# Patient Record
Sex: Female | Born: 1940 | Race: White | Hispanic: No | Smoking: Never smoker
Health system: Southern US, Community
[De-identification: ages and names within clinical notes are randomized; demographics above are authoritative.]

## PROBLEM LIST (undated history)

## (undated) ENCOUNTER — Ambulatory Visit (HOSPITAL_BASED_OUTPATIENT_CLINIC_OR_DEPARTMENT_OTHER): Admission: EM

## (undated) DIAGNOSIS — H35039 Hypertensive retinopathy, unspecified eye: Secondary | ICD-10-CM

## (undated) DIAGNOSIS — M810 Age-related osteoporosis without current pathological fracture: Secondary | ICD-10-CM

## (undated) DIAGNOSIS — I201 Angina pectoris with documented spasm: Secondary | ICD-10-CM

## (undated) DIAGNOSIS — I209 Angina pectoris, unspecified: Secondary | ICD-10-CM

## (undated) DIAGNOSIS — I252 Old myocardial infarction: Secondary | ICD-10-CM

## (undated) DIAGNOSIS — I10 Essential (primary) hypertension: Secondary | ICD-10-CM

## (undated) DIAGNOSIS — H3320 Serous retinal detachment, unspecified eye: Secondary | ICD-10-CM

## (undated) DIAGNOSIS — G43909 Migraine, unspecified, not intractable, without status migrainosus: Secondary | ICD-10-CM

## (undated) DIAGNOSIS — I471 Supraventricular tachycardia: Secondary | ICD-10-CM

## (undated) DIAGNOSIS — H269 Unspecified cataract: Secondary | ICD-10-CM

## (undated) DIAGNOSIS — E785 Hyperlipidemia, unspecified: Secondary | ICD-10-CM

## (undated) DIAGNOSIS — A0472 Enterocolitis due to Clostridium difficile, not specified as recurrent: Secondary | ICD-10-CM

## (undated) HISTORY — PX: CARDIAC CATHETERIZATION: SHX172

## (undated) HISTORY — DX: Supraventricular tachycardia: I47.1

## (undated) HISTORY — PX: BUNIONECTOMY: SHX129

## (undated) HISTORY — PX: CHOLECYSTECTOMY: SHX55

## (undated) HISTORY — DX: Essential (primary) hypertension: I10

## (undated) HISTORY — DX: Hypertensive retinopathy, unspecified eye: H35.039

## (undated) HISTORY — DX: Age-related osteoporosis without current pathological fracture: M81.0

## (undated) HISTORY — DX: Unspecified cataract: H26.9

## (undated) HISTORY — DX: Enterocolitis due to Clostridium difficile, not specified as recurrent: A04.72

## (undated) HISTORY — DX: Migraine, unspecified, not intractable, without status migrainosus: G43.909

## (undated) HISTORY — DX: Serous retinal detachment, unspecified eye: H33.20

## (undated) HISTORY — PX: HAMMER TOE SURGERY: SHX385

## (undated) HISTORY — DX: Angina pectoris, unspecified: I20.9

## (undated) HISTORY — DX: Old myocardial infarction: I25.2

## (undated) HISTORY — DX: Angina pectoris with documented spasm: I20.1

## (undated) HISTORY — PX: ABDOMINAL HYSTERECTOMY: SHX81

## (undated) HISTORY — DX: Hyperlipidemia, unspecified: E78.5

---

## 2003-01-04 ENCOUNTER — Encounter: Admission: RE | Admit: 2003-01-04 | Discharge: 2003-01-04 | Payer: Self-pay | Admitting: Unknown Physician Specialty

## 2003-01-04 ENCOUNTER — Encounter: Payer: Self-pay | Admitting: Unknown Physician Specialty

## 2007-05-23 ENCOUNTER — Inpatient Hospital Stay (HOSPITAL_COMMUNITY): Admission: AD | Admit: 2007-05-23 | Discharge: 2007-05-25 | Payer: Self-pay | Admitting: Cardiology

## 2007-05-23 ENCOUNTER — Ambulatory Visit: Payer: Self-pay | Admitting: Cardiology

## 2007-06-08 ENCOUNTER — Ambulatory Visit: Payer: Self-pay | Admitting: Cardiovascular Disease

## 2007-10-26 ENCOUNTER — Ambulatory Visit (HOSPITAL_BASED_OUTPATIENT_CLINIC_OR_DEPARTMENT_OTHER): Admission: RE | Admit: 2007-10-26 | Discharge: 2007-10-26 | Payer: Self-pay | Admitting: Orthopedic Surgery

## 2009-06-05 ENCOUNTER — Encounter: Admission: RE | Admit: 2009-06-05 | Discharge: 2009-06-05 | Payer: Self-pay | Admitting: Obstetrics and Gynecology

## 2010-06-10 ENCOUNTER — Encounter: Admission: RE | Admit: 2010-06-10 | Discharge: 2010-06-10 | Payer: Self-pay | Admitting: Obstetrics and Gynecology

## 2011-02-03 NOTE — Assessment & Plan Note (Signed)
[facility]                            CARDIOLOGY OFFICE NOTE   ERMALEE, MEALY                        MRN:          [id number]  DATE:06/08/2007                            DOB:          [birth year]    Kahmari Herard presents for hospital followup at the [facility] Cardiology  Office on June 08, 2007.  Ms. Rawlinson is a delightful 70 year old  woman who suffered a on-ST elevation MI earlier this month.  She  ultimately underwent cardiac catheterization and was found to have  normal coronary arteries.  There was no plaque identified.  Despite  normal coronaries, there was a focal wall motion abnormality of the  anterolateral wall of the left ventricle.  The overall left ventricular  ejection fraction was preserved and estimated at 55%.  Ms. Kreuser event  was precipitated by a stressful situation when she ran over her 16-year-  old cat with her car.  She developed chest pain immediately following  the episode and had a recurrence the following day.  At the time of the  recurrence she was evaluated at [facility] and ruled in for  myocardial infarction.   Since the event, Ms. Razzano has done very well.  She has had no further  chest pain.  She has had no dyspnea or other complaints.  She denies  palpitations, lightheadedness, syncope, orthopnea, or PND.   CURRENT MEDICATIONS:  1. Metoprolol ER 50 mg daily.  2. Zocor 40 mg at bedtime.  3. Aspirin 81 mg daily.  4. Atacand 16 mg daily.  5. Premarin 0.625 mg every other day.  6. Fosamax once a week.  7. Fish oil daily.  8. Calcium.  9. Stool softener.   ALLERGIES:  1. CODEINE.  2. SULFA.  3. ACE INHIBITORS.   PERTINENT PAST MEDICAL PROBLEMS:  1. Hypertension.  2. Osteoporosis.  3. History of migraine headaches.  4. Hyperlipidemia.  5. Prolonged hormone replacement therapy.   PHYSICAL EXAMINATION:  GENERAL:  Ms. Delisi is alert and oriented.  She  is in no acute distress.  VITAL SIGNS:   Her weight is 134 pounds, blood pressure 106/64, heart  rate 57, respiratory rate 16.  HEENT:  Normal.  NECK:  Normal carotid upstrokes without bruits.  Jugular venous pressure  normal.  LUNGS:  Clear to auscultation bilaterally.  HEART:  The apex was discrete and nondisplaced.  Heart is regular rate  and rhythm without murmurs or gallops.  ABDOMEN:  Soft, nontender.  No organomegaly.  No abdominal bruits.  EXTREMITIES:  No clubbing, cyanosis, or edema.  Peripheral pulses are 2  plus and equal throughout.   EKG shows a sinus bradycardia and is otherwise within normal limits.   ASSESSMENT:  Ms. Scioli is currently stable from a cardiovascular  standpoint.   Her cardiac problems are as follows, recent non-ST elevation myocardial  infarction.  Ms. Browder has had no recurrent symptoms and had normal  coronaries at catheterization.  She did have a clear cut wall motion  abnormality and her event may have been precipitated by coronary  vasospasm.  Recommend continuation of  current therapy including aspirin,  Simvastatin, metoprolol, and Atacand.  She is attempting to wean off of  Premarin.  She has been on Premarin since her late 68s and is having  some trouble with hot flashes.  We had a long discussion regarding the  small but real risk of increased cardiac events with hormone replacement  therapy after an initial event.  I advised that if she is highly  symptomatic off Premarin then it certainly would be acceptable for  resume daily use.  She would like to follow up in [place] and is  planning on following up with Dr. Bing Matter.  I have recommended a  followup echocardiogram in 2-3 months but certainly will leave that to  his discretion.   Ms. Brys clearly has a component of situational anxiety and high stress  on rare occasion.  I wrote her for a small number of Valium for as  needed use.  She was written for Valium 5 mg, #12, with no refills.   As stated above, Ms. Bastin will  follow up in [place].  I would be happy  to see her back at any time if I can be of further assistance.     Veverly Fells. Excell Seltzer, MD  Electronically Signed    MDC/MedQ  DD: 06/12/2007  DT: 06/12/2007  Job #: [id number]   cc:   Gypsy Balsam  Dr. Veatrice Kells in [place]

## 2011-02-03 NOTE — H&P (Signed)
Melissa Lozano, Melissa Lozano                 ACCOUNT NO.:  [id number]   MEDICAL RECORD NO.:  [id number]          PATIENT TYPE:  INP   LOCATION:  2025                         FACILITY:  [facility]   PHYSICIAN:  Gerrit Friends. Dietrich Pates, MD, FACCDATE OF BIRTH:  07-30-1941   DATE OF ADMISSION:  05/23/2007  DATE OF DISCHARGE:                              HISTORY & PHYSICAL   Melissa Lozano is a very pleasant 70 year old Caucasian female transferred  here from [facility] in the setting of a non-ST elevated MI.  Her  primary care physician is Dr. Foye Deer.  Attending physician at  [facility] was Dr. Gypsy Balsam.     Melissa Lozano ordeal began on Thursday morning.  She states she was  leaving her home and accidentally ran over her 73 year old cat.  She  became very upset and hysterical.  Her husband states it took him about  30 minutes to help her calm down.  She was very attached to her pet.  Later that evening she began having chest pain around 8:00 p.m. She  described it as a heavy pressure in the dead center of her chest,  radiating up into her neck.  She felt like her blood pressure was  elevated.  She checked it with her machine at home and it was elevated  200+/105.  She initially had her husband drive her to urgent care.  It  was closed.  They tried another urgent care that was also closed.  They  ultimately ended up at [facility] Emergency Room.  She states  once there it was very busy.  She apparently waited 40 minutes to be  triaged in.  She states she told the nurse she was having chest pain but  it had eased up some so apparently she did not receive a EKG or any work  up then.  She waited approximately 2 hours, still had not been seen by  the emergency room  physician.  She states she started to feel better so  she told the nurse that she was going to go home.  She states the nurse  there told her that more than likely it was just a panic attack, per  patient's report.   She went home, went to bed, states she felt better.  Friday she got up, did her normal routine.  She works out at [employer]  four days a week.  That afternoon around 3 o'clock the chest discomfort  returned.  This time it was more intense.  She rates it as a  6 on a  scale of 1-10.  She states it was difficult to take a deep breath.  She  felt like her blood pressure was elevated again.  She drove herself to  Dr. Hoy Finlay office.  He was not in but one of his associates was  there.  The chest discomfort increased to an 8.  She became very  diaphoretic with labored respirations.  She was given aspirin and  Nitroglycerin and EMS was called.  The patient was transported to  [facility].  EKG did not show  any acute ST or T-wave changes.  The patient was given aspirin and nitroglycerin with complete resolution  of discomfort.  EKG did show some flattening of T-waves in lead I and  inverted T-waves in AVL, otherwise normal.  However, cardiac markers  came back elevated with a troponin 3.46.  The patient was started on  Plavix, nitroglycerin paste, Atacand, Coreg, Zocor and Lovenox.  The  patient was stabilized and monitored over the weekend with plans to  transfer to [facility] for cardiac catheterization.  Melissa Lozano has just  arrived from [facility].  She currently is pain free.   ALLERGIES:  SULFA AND CODEINE.  There is a questionable history of  intolerance to Prevacid although the patient can not recall what this  causes.  Apparently it is not an ACE inhibitor secondary to a dry cough.   MEDICATIONS AT HOME:  1. Fosamax.  2. Atacand 60 mg.  3. Aspirin.  4. Premarin.   At [facility] she has received aspirin, Plavix 75, Lovenox, Zocor  80 and nitroglycerin 1 inch paste and (nitroglycerin).  This was  continued secondary to severe headache, Coreg 6.25 b.i.d. and Atacand 16  mg daily.   PAST MEDICAL HISTORY:  1. Hypertension.  2. Osteoporosis.  3. Mild  hyperlipidemia.  4. History of migraines.   PAST SURGICAL HISTORY:  1. Appendectomy.  2. Hysterectomy.  3. Tonsillectomy.  4. Cosmetic surgery.  5. Colon polyps.   Melissa Lozano had a 2-D echocardiogram done at [facility] also that  showed a well-preserved ejection fraction, questionable hypokinesis  involving the apical portion of the lateral wall with mild TR.   SOCIAL HISTORY:  She lives in [place] with her husband.  She is in the  antique business.  They have no children.  Apparently her husband is a  patient of Dr. Rosalyn Charters.  She denies any tobacco, ETOH, drug or herbal  medication use.  She follows a low-fat diet.  She works out at [employer]  during cardiovascular and weights 4 days a week.   FAMILY HISTORY:  Mother deceased in his 52s secondary to breast cancer.  Father deceased at age 76 secondary to abdominal aortic aneurysm.  He  had a known history of coronary artery disease, status post MI.  One  brother and one sister with thyroid problems.  Brother with hypertension   REVIEW OF SYSTEMS:  Positive for sweats, occasional headaches, chest  pain, shortness of breath.  All other systems negative per patient.   PHYSICAL EXAMINATION:  VITAL SIGNS:  Temperature 97.2, pulse 65,  respirations 20, blood pressure currently 127/79, sat 100% on room air.  GENERAL:  She is in no acute distress.  HEAD, EYES, EARS, NOSE AND THROAT:  Normocephalic, atraumatic.  Pupils  equal, round, react to light.  Sclerae is clear.  Mucous membranes  moist.  The patient has her own teeth.  NECK:  Supple without lymphadenopathy.  No bruits.  No JVD.  CARDIOVASCULAR:  Exam reveals S1-S2 regular rate and rhythm.  She HSA a  positive S4.  LUNGS:  Clear to auscultation bilaterally.  SKIN:  Warm and dry.  ABDOMEN:  Soft, nontender.  Positive bowel sounds.  LOWER EXTREMITIES:  Without clubbing, cyanosis or edema.  She has 2+ DPs  bilaterally.  NEUROLOGICALLY:  Alert and oriented x3.  Cranial  nerves II-XII grossly  intact.   CHEST X-RAY:  Showing no acute findings noted.  Chest x-ray done at  [facility].  EKGs at [facility]  showing normal sinus  rhythm at a rate in the 60s without acute ST changes.  EKG here is  pending.   LABORATORY DATA:  Note, all of the lab work results in this dictation  are from [facility].  Lab work here is pending.  H&H 12 and 35.5,  WBCs 5.2, platelets 239,000.  Sodium 138, potassium 3.9, chloride 105,  CO2 28, BUN 13, creatinine 0.6, glucose 77, AST 36, ALT 25, D-dimer  negative.  PT 10.1, INR 1, total cholesterol 170, triglycerides 81, LDL  88.7, HDL 65.1, TSH 3.91.  T4 5.8, T3 31.  Cardiac enzymes, troponin  3.46, 3.73 and 1.85.   IMPRESSION:  Ms. Christenbury is currently pain free.  Blood pressure stable.  She is aware that she has suffered a non-ST elevated MI.   PLAN:  The plan at this time is to proceed with cardiac catheterization  in the a.m.  The risks and benefits have been discussed with the  patient.  She agrees to proceed with catheterization.  I have also  talked with her concerning medication therapy and the reasons as to why  she is on medications as stated above.  She understands the importance  of medication therapy, exercise, diet and decreased stress and will have  cardiac rehab.  I also talked with the patient.      Dorian Pod, ACNP      Gerrit Friends. Dietrich Pates, MD, [facility]  Electronically Signed    MB/MEDQ  D:  05/23/2007  T:  05/23/2007  Job:  [id number]   cc:   Barney Drain, M.D.

## 2011-02-03 NOTE — Cardiovascular Report (Signed)
Melissa Lozano, BLUMBERG                 ACCOUNT NO.:  [id number]   MEDICAL RECORD NO.:  [id number]          PATIENT TYPE:  INP   LOCATION:  2025                         FACILITY:  [facility]   PHYSICIAN:  Veverly Fells. Excell Seltzer, MD  DATE OF BIRTH:  07-Jul-1941   DATE OF PROCEDURE:  05/24/2007  DATE OF DISCHARGE:                            CARDIAC CATHETERIZATION   PROCEDURE:  Left heart catheterization, selective coronary angiography,  left ventricular angiography and StarClose of the right femoral artery.   INDICATIONS:  Ms. Muro is a 70 year old woman who underwent an  extremely stressful situation last week when she ran over and killed her  cat of 16 years.  She was very upset and experienced substernal chest  pain at that time.  Her pain resolved after several hours, but the  following day she had a second episode of chest pain and she was  admitted to [facility].  She ruled in for myocardial infarction  with elevated cardiac enzymes and was transferred to [facility].  She  was referred for cardiac catheterization.  She has had no further  recurrent chest pain since here.   Risks and indications of the procedure were explained to the patient.  Informed consent was obtained.  The right groin was prepped, draped,  anesthetized with 1% lidocaine using modified Seldinger technique.  A 6-  French sheath was placed in the right femoral artery.  Multiple views of  the left and right coronary arteries were taken using standard preformed  Judkins catheters.  Following selective coronary angiography, an angled  pigtail catheter was inserted into the left ventricle where pressures  were recorded.  A left ventriculogram was performed.  A pullback across  the aortic valve was done.  At the completion of the procedure, a  StarClose device was deployed to seal the femoral arteriotomy..  7.   FINDINGS:  Aortic pressure 121/62 with a mean of 89, left ventricular  pressure 122/11.   The left mainstem  is angiographically normal and bifurcates into the LAD  and left circumflex.   The LAD is a large-caliber vessel that courses down and wraps around the  LV apex.  There is a large first diagonal branch that gives off multiple  branches.  There is no significant angiographic disease throughout the  LAD or its diagonal branches.   The left circumflex is large-caliber vessel that courses down and  supplies a large marginal branch.  There is also an intermediate branch  present that is of relatively small caliber.  There is no significant  angiographic disease in the left circumflex system.  The AV groove  circumflex beyond the first OM branch is fairly small.   The right coronary artery is angiographically normal.  It is a dominant  vessel.  It courses down and supplies a PDA, as well as well as a large  posterolateral branch.   Left ventricular function assessed by ventriculography is normal with  the exception of a focal anterolateral area of akinesis.  The overall  LVEF is normal, estimated at 55%.   ASSESSMENT:  1. Normal coronary arteries.  2. Focal left ventricular wall motion abnormality with preserved      overall left ventricular function.   DISCUSSION:  Ms. Manwarren may have had a transient coronary vasospasm.  It  is possible that her LV wall motion abnormality represents a stress  induced cardiomyopathy, but it certainly is not typical of Takotsubo.  In any event, I would recommend treatment with aspirin and a statin.  With her LV wall motion abnormality, she may benefit from an Ace  inhibitor,      Veverly Fells. Excell Seltzer, MD  Electronically Signed     MDC/MEDQ  D:  05/24/2007  T:  05/25/2007  Job:  [id number]

## 2011-02-03 NOTE — Discharge Summary (Signed)
Melissa Lozano, Melissa Lozano                 ACCOUNT NO.:  [id number]   MEDICAL RECORD NO.:  [id number]          PATIENT TYPE:  INP   LOCATION:  2025                         FACILITY:  [facility]   PHYSICIAN:  Veverly Fells. Excell Seltzer, MD  DATE OF BIRTH:  Mar 20, 1941   DATE OF ADMISSION:  05/23/2007  DATE OF DISCHARGE:  05/25/2007                         DISCHARGE SUMMARY - REFERRING   SUMMARY OF HISTORY:  The patient is a 70 year old white female who was  transferred from [facility] with a non-ST-elevated myocardial  infarction.  Her symptoms initially began last Tuesday morning when she  accidentally ran over her 80 year old cat.  She became hysterical and  very upset and her husband stated that it took her 30 minutes to calm  down.  Later that evening, she began having chest discomfort which she  described as a pressure radiating into her neck.  Her blood pressure was  elevated at home 200+/105; they initially drove to two separate Urgent  Cares, only to find them closed and ended up at [facility] Emergency Room.  She waited 40 minutes to be triaged and then approximately 2 hours  before she saw an ER physician.  She felt better and went home.  On  Friday, after performing her normal routine at the gym, her discomfort  returned around 3 o'clock very intense and difficult to take a deep  breath.  She drove herself to her primary care's  physician's office.  However, one of his associates saw her.  She was noted to be diaphoretic  with labored respirations.  EMS was called and she was transported to  the hospital.  She did not have any acute EKG changes and after  receiving aspirin, nitroglycerin, her discomfort was resolved.  EKG did  show some flattening inferiorly.  She ruled in for myocardial infarction  and was transferred to [facility] for cardiac catheterization.   PAST MEDICAL HISTORY:  Past medical history is notable for:  1. Hypertension.  2. Osteoporosis.  3. Mild hyperlipidemia.  4.  A history of migraines.  5. Appendectomy.  6. Hysterectomy.  7. Tonsillectomy.  8. Recent cosmetic surgery.  9. Colon polyps.   An echocardiogram at [facility] showed probable hypokinesis of the apical  portion of the lateral wall with mild TR with a preserved ejection  fraction.   LABORATORY DATA:  At [facility] on September 2, H&H is 12.5 and 35.5,  normal indices, platelets are 270, WBCs 5.1.  On discharge on the 3rd,  H&H was 12.5 and 35.8, normal indices, platelets 270, WBCs 5.3.  PTT 38,  PT 12.8, sodium 138, potassium 3.8, BUN 14, creatinine 0.71, normal LFTs  except alkaline phosphatase was slightly low at 35.  Protein and albumin  were slightly low at 5.5 and 2.8.  Prior to discharge, sodium is 140,  potassium 390, BUN 15, creatinine 0.66.  CK-MB was 61 and 1.5 with a  normal relative index.  Troponin was slightly abnormal at 0.14.  At  [facility], chest x-ray did not show any active disease.  CK MBs,  relative indexes, and troponins were abnormal.  TSH  on the 31st was 3.91  with normal T3U, FT4, and T4.  Fasting lipids at [facility] on the 30th  showed a total cholesterol 170, triglycerides 81, LDL 88.7.   HOSPITAL COURSE:  Ms. Oates was accepted in transfer by Dr. Donnamarie Rossetti.  She was admitted to 2000 by Dorian Pod, Nurse  Practitioner, and Dr. Donnamarie Rossetti and continued on Lovenox and her  transfer medications.  Given her presentation, it was felt that she  should undergo cardiac catheterization.  Echocardiogram at [facility] had shown preserved ejection fraction with possible hypokinesis  involving the apical portion of the inferior and lateral wall.  Catheterization on May 24, 2007 by Dr. Excell Seltzer did not show any  evidence of coronary artery disease confirming an EF of 55% with a focal  anterolateral wall motion abnormality.  Progression nurse assisted with  discharge needs.  She was.  Catheterization site was intact and on the  third Dr.  Excell Seltzer felt that the patient could be discharged home.  He  felt that the risk of dual antiplatelet therapy outweighed the benefit  given her normal coronaries.  He recommended followup echocardiogram in  3 months and  felt that she should be weaned off for hormone replacement  therapy.   DISCHARGE DIAGNOSES:  1. Non-ST-elevated myocardial infarction.  2. Hyperlipidemia.  3. Hormone replacement therapy.  4. No evidence of coronary artery disease on cardiac catheterization.  5. Hypertension.  6. History as previously.   PROCEDURES PERFORMED:  Cardiac catheterization on May 24, 2007 by  Dr. Tonny Bollman.   DISPOSITION:  The patient is discharged home.  She was given permission  to return to work in 1 week.  She was asked to avoid lifting, driving,  sexual activity, or heavy exertion for 1 week.  Wound care as per  supplemental sheet.  New prescriptions include metoprolol ER 50 mg  daily, Zocor 40 mg q. nightly, and nitroglycerin 0.4 as needed.  She is  asked to continue aspirin 81 mg daily, Atacand 16 mg daily, resume her  Fosamax that she was previously taking.  Her Premarin is being decreased  at 0.625 mg every other day for 2 weeks and then every third day for 2  weeks and then she was asked to stop this medication.  She will need  blood work in 6-8 weeks in regards to FLP and LFTs since the Zocor was  initiated.  Echocardiogram will also be performed in approximately 3  months to reassess her LV function.  She was asked to bring all  medications to all appointments and to follow-up with her primary care  physician, Dr. Tomasa Blase, as needed.  I will arrange followup with Dr.  Excell Seltzer per her request prior to her being released.  We also arrange  cardiac rehab to assist with education and ambulation prior to her  discharge.  Discharge time 35 minutes.      Joellyn Rued, PA-C      Veverly Fells. Excell Seltzer, MD  Electronically Signed    EW/MEDQ  D:  05/25/2007  T:  05/25/2007   Job:  [id number]   cc:   DR. Tomasa Blase (PRIMARY CARE PHYSICIAN)  CARDIOLOGIST) DR. Gypsy Balsam (REFERRING

## 2011-02-03 NOTE — Op Note (Signed)
NAMEARUSHI, Melissa Lozano                 ACCOUNT NO.:  [id number]   MEDICAL RECORD NO.:  [id number]          PATIENT TYPE:  AMB   LOCATION:  DSC                          FACILITY:  [facility]   PHYSICIAN:  Mila Homer. Sherlean Foot, M.D. DATE OF BIRTH:  1941/08/06   DATE OF PROCEDURE:  10/26/2007  DATE OF DISCHARGE:                               OPERATIVE REPORT   SURGEON:  Mila Homer. Sherlean Foot, M.D.   ASSISTANT:  None.   ANESTHESIA:  General.   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   POSTOPERATIVE DIAGNOSIS:  Right shoulder adhesive capsulitis and  shoulder impingement.   PROCEDURE:  Right shoulder manipulation and subacromial decompression.   INDICATIONS FOR PROCEDURE:  The patient is a 70 year old that failed  injections, anti-inflammatories and physical therapy.  Informed consent  was obtained.   DESCRIPTION OF PROCEDURE:  The patient was laid supine and administered  general anesthesia and then placed in beach-chair position.  The right  shoulder was prepped and draped in the usual sterile fashion.  Inferolateral and inferomedial portals were created with a #11 blade,  blunt trocar and cannula.  Diagnostic arthroscopy revealed no  chondromalacia in the glenohumeral joint at all.  The camera was  redirected into the subacromial space and a bursectomy was performed  through the direct lateral portal.  I used the ArthroCare debridement  wand to clean off the undersurface of the acromion and release the CA  ligament.  I then performed an acromioplasty with the 4 mm cylindrical  bur.  This afforded excellent decompression, 30/30 view looked very,  very nice and well decompressed.  Rotator cuff looked good as well.  Note that I did do a manipulation prior to the operation and regained  approximately 20 degrees of overhead elevation.  Dressed with Xeroform,  dressing sponges, closed with 4-0 nylons and near shoulder dressing with  2-inch silk tape and  a simple sling.   COMPLICATIONS:  None.   DRAINS:  None.           ______________________________  Mila Homer. Sherlean Foot, M.D.     SDL/MEDQ  D:  10/26/2007  T:  10/27/2007  Job:  [id number]

## 2011-05-05 ENCOUNTER — Other Ambulatory Visit: Payer: Self-pay | Admitting: Obstetrics and Gynecology

## 2011-05-05 DIAGNOSIS — Z1231 Encounter for screening mammogram for malignant neoplasm of breast: Secondary | ICD-10-CM

## 2011-06-12 LAB — BASIC METABOLIC PANEL
BUN: 18
CO2: 32
Chloride: 104
Creatinine, Ser: 0.83

## 2011-06-15 ENCOUNTER — Ambulatory Visit
Admission: RE | Admit: 2011-06-15 | Discharge: 2011-06-15 | Disposition: A | Discharge status: Home / Self Care | Payer: Medicare Other | Source: Ambulatory Visit | Attending: Obstetrics and Gynecology | Admitting: Obstetrics and Gynecology

## 2011-06-15 DIAGNOSIS — Z1231 Encounter for screening mammogram for malignant neoplasm of breast: Secondary | ICD-10-CM

## 2011-07-03 LAB — CBC
HCT: 35.5 — ABNORMAL LOW
Hemoglobin: 12.5
MCHC: 35
MCV: 87.4
Platelets: 270
RBC: 4.07
RBC: 4.12
WBC: 5.1

## 2011-07-03 LAB — PROTIME-INR: INR: 0.9

## 2011-07-03 LAB — COMPREHENSIVE METABOLIC PANEL
BUN: 14
CO2: 29
Chloride: 108
Creatinine, Ser: 0.71
GFR calc non Af Amer: >60
Total Bilirubin: 0.4

## 2011-07-03 LAB — BASIC METABOLIC PANEL
BUN: 12
BUN: 15
CO2: 28
Calcium: 8.4
Chloride: 108
Creatinine, Ser: 0.66
Creatinine, Ser: 0.67
GFR calc Af Amer: >60
GFR calc non Af Amer: >60
Glucose, Bld: 92

## 2011-07-03 LAB — APTT: aPTT: 38 — ABNORMAL HIGH

## 2012-05-24 ENCOUNTER — Other Ambulatory Visit: Payer: Self-pay | Admitting: Obstetrics and Gynecology

## 2012-05-24 DIAGNOSIS — Z1231 Encounter for screening mammogram for malignant neoplasm of breast: Secondary | ICD-10-CM

## 2012-06-15 ENCOUNTER — Ambulatory Visit
Admission: RE | Admit: 2012-06-15 | Discharge: 2012-06-15 | Disposition: A | Discharge status: Home / Self Care | Payer: Medicare Other | Source: Ambulatory Visit | Attending: Obstetrics and Gynecology | Admitting: Obstetrics and Gynecology

## 2012-06-15 DIAGNOSIS — Z1231 Encounter for screening mammogram for malignant neoplasm of breast: Secondary | ICD-10-CM

## 2013-07-19 ENCOUNTER — Other Ambulatory Visit: Payer: Self-pay

## 2013-07-19 DIAGNOSIS — Z1231 Encounter for screening mammogram for malignant neoplasm of breast: Secondary | ICD-10-CM

## 2013-08-22 ENCOUNTER — Ambulatory Visit
Admission: RE | Admit: 2013-08-22 | Discharge: 2013-08-22 | Disposition: A | Discharge status: Home / Self Care | Payer: Medicare Other | Source: Ambulatory Visit

## 2013-08-22 DIAGNOSIS — Z1231 Encounter for screening mammogram for malignant neoplasm of breast: Secondary | ICD-10-CM

## 2014-07-17 ENCOUNTER — Other Ambulatory Visit: Payer: Self-pay

## 2014-07-17 DIAGNOSIS — Z1231 Encounter for screening mammogram for malignant neoplasm of breast: Secondary | ICD-10-CM

## 2014-08-23 ENCOUNTER — Ambulatory Visit
Admission: RE | Admit: 2014-08-23 | Discharge: 2014-08-23 | Disposition: A | Discharge status: Home / Self Care | Payer: 59 | Source: Ambulatory Visit

## 2014-08-23 DIAGNOSIS — Z1231 Encounter for screening mammogram for malignant neoplasm of breast: Secondary | ICD-10-CM

## 2015-02-01 ENCOUNTER — Ambulatory Visit (INDEPENDENT_AMBULATORY_CARE_PROVIDER_SITE_OTHER): Payer: Medicare Other

## 2015-02-01 ENCOUNTER — Encounter: Payer: Self-pay | Admitting: Podiatrist

## 2015-02-01 ENCOUNTER — Ambulatory Visit (INDEPENDENT_AMBULATORY_CARE_PROVIDER_SITE_OTHER): Payer: Medicare Other | Admitting: Podiatrist

## 2015-02-01 VITALS — BP 138/74 | HR 62 | Resp 18

## 2015-02-01 DIAGNOSIS — M6789 Other specified disorders of synovium and tendon, multiple sites: Secondary | ICD-10-CM | POA: Diagnosis not present

## 2015-02-01 DIAGNOSIS — M76829 Posterior tibial tendinitis, unspecified leg: Secondary | ICD-10-CM

## 2015-02-01 DIAGNOSIS — R52 Pain, unspecified: Secondary | ICD-10-CM | POA: Diagnosis not present

## 2015-02-01 NOTE — Progress Notes (Signed)
   Subjective:    Patient ID: Melissa Lozano, female    DOB: Feb 28, 1941, 74 y.o.   MRN: [id number]  HPI MY LEFT ANKLE IS HURTING AND IS SORE AND TENDER AND THERE IS NO NUMBNESS OR TINGLING AND I AM LIMPING AND I AM FLAT FOOTED AND THERE IS NO SWELLING AND HAS BEEN LIMPING    Review of Systems  HENT: Positive for hearing loss.   Skin: Positive for rash.  Hematological: Bruises/bleeds easily.  All other systems reviewed and are negative.      Objective:   Physical Exam Patient is awake, alert, and oriented x 3.  In no acute distress.  Vascular status is intact with palpable pedal pulses at 2/4 DP and PT bilateral and capillary refill time within normal limits. Neurological sensation is also intact bilaterally via Semmes Weinstein monofilament at 5/5 sites. Light touch, vibratory sensation, Achilles tendon reflex is intact. Dermatological exam reveals skin color, turger and texture as normal. No open lesions present.  Musculature intact with dorsiflexion, plantarflexion, inversion, eversion.  Pain on palpation along the course of the posterior tibial tendon is noted.  Pain at the insertion site is also palpated.  No pain with heel raise test however she is unable to do a single heel raise on the left foot like she can on the right.  No ankle pain is noted laterally.       Assessment & Plan:  Posterior tibial tendonitis  Plan:  Recommended support for the posterior tibial tendon with a custom orthotic.  The patient would like to hold off at this time.  Also recommended an ankle stabilization brace for the ankle.  She will try this as well as strengthing exercises which were printed for her use.

## 2015-02-01 NOTE — Patient Instructions (Signed)
Posterior Tibial Tendon Tendinitis with Rehab Tendonitis is a condition that is characterized by inflammation of a tendon or the lining (sheath) that surrounds it. The inflammation is usually caused by damage to the tendon, such as a tendon tear (strain). Sprains are classified into three categories. Grade 1 sprains cause pain, but the tendon is not lengthened. Grade 2 sprains include a lengthened ligament due to the ligament being stretched or partially ruptured. With grade 2 sprains there is still function, although the function may be diminished. Grade 3 sprains are characterized by a complete tear of the tendon or muscle, and function is usually impaired. Posterior tibialis tendonitis is tendonitis of the posterior tibial tendon, which attaches muscles of the lower leg to the foot. The posterior tibial tendon is located in the back of the ankle and helps the body straighten (plantar flex) and rotate inward (medially rotate) the ankle. SYMPTOMS   Pain, tenderness, swelling, warmth, and/or redness over the back of the inner ankle at the posterior tibial tendon or the inner part of the mid-foot.  Pain that worsens with plantar flexion or medial rotation of the ankle.  A crackling sound (crepitation) when the tendon is moved or touched. CAUSES  Posterior tibial tendonitis occurs when damage to the posterior tibial tendon starts an inflammatory response. Common mechanisms of injury include:  Degenerative (occurs with aging) processes that weaken the tendon and make it more susceptible to injury.  Stress placed on the tendon from an increase in the intensity, frequency, or duration of training.  Direct trauma to the ankle.  Returning to activity before a previous ankle injury is allowed to heal. RISK INCREASES WITH:  Activities that involve repetitive and/or stressful plantar flexion (jumping, kicking, or running up/down hills).  Poor strength and flexibility.  Flat feet.  Previous injury  to the foot, ankle, or leg. PREVENTION   Warm up and stretch properly before activity.  Allow for adequate recovery between workouts.  Maintain physical fitness:  Strength, flexibility, and endurance.  Cardiovascular fitness.  Learn and use proper technique. When possible, have a coach correct improper technique.  Complete rehabilitation from a previous foot, ankle, or leg injury.  If you have flat feet, wear arch supports (orthotics). PROGNOSIS  If treated properly, the symptoms of tendonitis usually resolve within 6 weeks. This period may be shorter for injuries caused by direct trauma. RELATED COMPLICATIONS   Prolonged healing time, if improperly treated or reinjured.  Recurrent symptoms that result in a chronic problem.  Partial or complete tendon tear (rupture) requiring surgery. TREATMENT  Treatment initially involves the use of ice and medication to help reduce pain and inflammation. The use of strengthening and stretching exercises may help reduce pain with activity. These exercises may be performed at home or with referral to a therapist. Often times, your caregiver will recommend immobilizing the ankle to allow the tendon to heal. If you have flat feet, you may be advised to wear orthotic arch supports. If symptoms persist for greater than 6 months despite nonsurgical (conservative) treatment, then surgery may be recommended. MEDICATION   If pain medication is necessary, then nonsteroidal anti-inflammatory medications, such as aspirin and ibuprofen, or other minor pain relievers, such as acetaminophen, are often recommended.  Do not take pain medication for 7 days before surgery.  Prescription pain relievers may be given if deemed necessary by your caregiver. Use only as directed and only as much as you need.  Corticosteroid injections may be given by your caregiver. These injections should  be reserved for the most serious cases because they may only be given a certain  number of times. HEAT AND COLD  Cold treatment (icing) relieves pain and reduces inflammation. Cold treatment should be applied for 10 to 15 minutes every 2 to 3 hours for inflammation and pain and immediately after any activity that aggravates your symptoms. Use ice packs or massage the area with a piece of ice (ice massage).  Heat treatment may be used prior to performing the stretching and strengthening activities prescribed by your caregiver, physical therapist, or athletic trainer. Use a heat pack or soak the injury in warm water. SEEK MEDICAL CARE IF:  Treatment seems to offer no benefit, or the condition worsens.  Any medications produce adverse side effects. EXERCISES RANGE OF MOTION (ROM) AND STRETCHING EXERCISES - Posterior Tibial Tendon Tendinitis These exercises may help you when beginning to rehabilitate your injury. Your symptoms may resolve with or without further involvement from your physician, physical therapist or athletic trainer. While completing these exercises, remember:   Restoring tissue flexibility helps normal motion to return to the joints. This allows healthier, less painful movement and activity.  An effective stretch should be held for at least 30 seconds.  A stretch should never be painful. You should only feel a gentle lengthening or release in the stretched tissue. RANGE OF MOTION - Ankle Plantar Flexion   Sit with your right / left leg crossed over your opposite knee.  Use your opposite hand to pull the top of your foot and toes toward you.  You should feel a gentle stretch on the top of your foot/ankle. Hold this position for __________ seconds. Repeat __________ times. Complete this exercise __________ times per day.  RANGE OF MOTION - Ankle Eversion   Sit with your right / left ankle crossed over your opposite knee.  Grip your foot with your opposite hand, placing your thumb on the top of your foot and your fingers across the bottom of your  foot.  Gently push your foot downward with a slight rotation so your littlest toes rise slightly.  You should feel a gentle stretch on the inside of your ankle. Hold the stretch for __________ seconds. Repeat __________ times. Complete this exercise __________ times per day.  RANGE OF MOTION - Ankle Inversion   Sit with your right / left ankle crossed over your opposite knee.  Grip your foot with your opposite hand, placing your thumb on the bottom of your foot and your fingers across the top of your foot.  Gently pull your foot so the smallest toe comes toward you and your thumb pushes the inside of the ball of your foot away from you.  You should feel a gentle stretch on the outside of your ankle. Hold the stretch for __________ seconds. Repeat __________ times. Complete this exercise __________ times per day.  RANGE OF MOTION - Dorsi/Plantar Flexion  While sitting with your right / left knee straight, draw the top of your foot upward by flexing your ankle. Then reverse the motion, pointing your toes downward.  Hold each position for __________ seconds.  After completing your first set of exercises, repeat this exercise with your knee bent. Repeat __________ times. Complete this exercise __________ times per day.  RANGE OF MOTION - Ankle Alphabet  Imagine your right / left big toe is a pen.  Keeping your hip and knee still, write out the entire alphabet with your "pen." Make the letters as large as you can without   increasing any discomfort. Repeat __________ times. Complete this exercise __________ times per day.  STRETCH - Gastrocsoleus   Sit with your right / left leg extended. Holding onto both ends of a belt or towel, loop it around the ball of your foot.  Keeping your right / left ankle and foot relaxed and your knee straight, pull your foot and ankle toward you using the belt/towel.  You should feel a gentle stretch behind your calf or knee. Hold this position for  __________ seconds. Repeat __________ times. Complete this exercise __________ times per day.  STRETCH - Gastroc, Standing   Place hands on wall.  Extend right / left leg, keeping the front knee somewhat bent.  Slightly point your toes inward on your back foot.  Keeping your right / left heel on the floor and your knee straight, shift your weight toward the wall, not allowing your back to arch.  You should feel a gentle stretch in the right / left calf. Hold this position for __________ seconds. Repeat __________ times. Complete this stretch __________ times per day. STRETCH - Soleus, Standing   Place hands on wall.  Extend right / left leg, keeping the other knee somewhat bent.  Slightly point your toes inward on your back foot.  Keep your right / left heel on the floor, bend your back knee, and slightly shift your weight over the back leg so that you feel a gentle stretch deep in your back calf.  Hold this position for __________ seconds. Repeat __________ times. Complete this stretch __________ times per day. STRENGTHENING EXERCISES - Posterior Tibial Tendon Tendinitis These exercises may help you when beginning to rehabilitate your injury. They may resolve your symptoms with or without further involvement from your physician, physical therapist, or athletic trainer. While completing these exercises, remember:   Muscles can gain both the endurance and the strength needed for everyday activities through controlled exercises.  Complete these exercises as instructed by your physician, physical therapist, or athletic trainer. Progress the resistance and repetitions only as guided. STRENGTH - Dorsiflexors  Secure a rubber exercise band/tubing to a fixed object (i.e., table, pole) and loop the other end around your right / left foot.  Sit on the floor facing the fixed object. The band/tubing should be slightly tense when your foot is relaxed.  Slowly draw your foot back toward you  using your ankle and toes.  Hold this position for __________ seconds. Slowly release the tension in the band and return your foot to the starting position. Repeat __________ times. Complete this exercise __________ times per day.  STRENGTH - Towel Curls  Sit in a chair positioned on a non-carpeted surface.  Place your foot on a towel, keeping your heel on the floor.  Pull the towel toward your heel by only curling your toes. Keep your heel on the floor.  If instructed by your physician, physical therapist, or athletic trainer, add ____________________ at the end of the towel. Repeat __________ times. Complete this exercise __________ times per day. STRENGTH - Ankle Eversion   Secure one end of a rubber exercise band/tubing to a fixed object (table, pole). Loop the other end around your foot just before your toes.  Place your fists between your knees. This will focus your strengthening at your ankle.  Drawing the band/tubing across your opposite foot, slowly pull your little toe out and up. Make sure the band/tubing is positioned to resist the entire motion.  Hold this position for __________ seconds.  Have   your muscles resist the band/tubing as it slowly pulls your foot back to the starting position. Repeat __________ times. Complete this exercise __________ times per day.  STRENGTH - Ankle Inversion   Secure one end of a rubber exercise band/tubing to a fixed object (table, pole). Loop the other end around your foot just before your toes.  Place your fists between your knees. This will focus your strengthening at your ankle.  Slowly, pull your big toe up and in, making sure the band/tubing is positioned to resist the entire motion.  Hold this position for __________ seconds.  Have your muscles resist the band/tubing as it slowly pulls your foot back to the starting position. Repeat __________ times. Complete this exercises __________ times per day.  Document Released:  09/07/2005 Document Revised: 01/22/2014 Document Reviewed: 12/20/2008 [facility] Patient Information 2015 [place], [place]. This information is not intended to replace advice given to you by your health care provider. Make sure you discuss any questions you have with your health care provider.

## 2015-07-23 ENCOUNTER — Other Ambulatory Visit: Payer: Self-pay

## 2015-07-23 DIAGNOSIS — Z1231 Encounter for screening mammogram for malignant neoplasm of breast: Secondary | ICD-10-CM

## 2015-08-16 DIAGNOSIS — I471 Supraventricular tachycardia: Secondary | ICD-10-CM

## 2015-08-16 DIAGNOSIS — E785 Hyperlipidemia, unspecified: Secondary | ICD-10-CM

## 2015-08-16 DIAGNOSIS — I4719 Other supraventricular tachycardia: Secondary | ICD-10-CM

## 2015-08-16 DIAGNOSIS — I201 Angina pectoris with documented spasm: Secondary | ICD-10-CM | POA: Insufficient documentation

## 2015-08-16 DIAGNOSIS — I209 Angina pectoris, unspecified: Secondary | ICD-10-CM

## 2015-08-16 DIAGNOSIS — I10 Essential (primary) hypertension: Secondary | ICD-10-CM

## 2015-08-16 HISTORY — DX: Essential (primary) hypertension: I10

## 2015-08-16 HISTORY — DX: Other supraventricular tachycardia: I47.19

## 2015-08-16 HISTORY — DX: Angina pectoris, unspecified: I20.9

## 2015-08-16 HISTORY — DX: Supraventricular tachycardia: I47.1

## 2015-08-16 HISTORY — DX: Angina pectoris with documented spasm: I20.1

## 2015-08-16 HISTORY — DX: Hyperlipidemia, unspecified: E78.5

## 2015-08-27 ENCOUNTER — Ambulatory Visit
Admission: RE | Admit: 2015-08-27 | Discharge: 2015-08-27 | Disposition: A | Discharge status: Home / Self Care | Payer: Medicare Other | Source: Ambulatory Visit

## 2015-08-27 DIAGNOSIS — Z1231 Encounter for screening mammogram for malignant neoplasm of breast: Secondary | ICD-10-CM

## 2016-08-10 ENCOUNTER — Other Ambulatory Visit: Payer: Self-pay | Admitting: Internal Medicine

## 2016-08-10 DIAGNOSIS — Z1231 Encounter for screening mammogram for malignant neoplasm of breast: Secondary | ICD-10-CM

## 2016-09-15 ENCOUNTER — Ambulatory Visit
Admission: RE | Admit: 2016-09-15 | Discharge: 2016-09-15 | Disposition: A | Discharge status: Home / Self Care | Payer: Medicare Other | Source: Ambulatory Visit | Attending: Internal Medicine | Admitting: Internal Medicine

## 2016-09-15 DIAGNOSIS — Z1231 Encounter for screening mammogram for malignant neoplasm of breast: Secondary | ICD-10-CM

## 2017-06-01 ENCOUNTER — Other Ambulatory Visit: Payer: Self-pay

## 2017-06-01 MED ORDER — VERAPAMIL HCL ER 180 MG PO TBCR: 180.0000 mg | EXTENDED_RELEASE_TABLET | Freq: Two times a day (BID) | ORAL | 1 refills | Status: DC

## 2017-06-04 ENCOUNTER — Encounter: Payer: Self-pay | Admitting: Sports Medicine

## 2017-06-04 ENCOUNTER — Ambulatory Visit (INDEPENDENT_AMBULATORY_CARE_PROVIDER_SITE_OTHER): Payer: Medicare Other

## 2017-06-04 ENCOUNTER — Encounter (INDEPENDENT_AMBULATORY_CARE_PROVIDER_SITE_OTHER): Payer: Self-pay

## 2017-06-04 ENCOUNTER — Ambulatory Visit (INDEPENDENT_AMBULATORY_CARE_PROVIDER_SITE_OTHER): Payer: Medicare Other | Admitting: Sports Medicine

## 2017-06-04 VITALS — BP 149/77 | HR 67 | Ht 63.0 in | Wt 147.0 lb

## 2017-06-04 DIAGNOSIS — M2041 Other hammer toe(s) (acquired), right foot: Secondary | ICD-10-CM | POA: Diagnosis not present

## 2017-06-04 DIAGNOSIS — M79671 Pain in right foot: Secondary | ICD-10-CM | POA: Diagnosis not present

## 2017-06-04 DIAGNOSIS — M2031 Hallux varus (acquired), right foot: Secondary | ICD-10-CM | POA: Diagnosis not present

## 2017-06-04 DIAGNOSIS — L84 Corns and callosities: Secondary | ICD-10-CM | POA: Diagnosis not present

## 2017-06-04 DIAGNOSIS — M21619 Bunion of unspecified foot: Secondary | ICD-10-CM

## 2017-06-04 NOTE — Progress Notes (Signed)
Subjective: Melissa Lozano is a 76 y.o. female patient who presents to office for evaluation of Right foot pain. Patient complains of progressive pain especially over her toes, especially the right fifth toe. States that she had a history of over 10 years ago foot surgery with significant deformity with her toes being shifted and deviated since surgery. States that over time she has developed pain at the fifth toe and wants to discuss what treatment options can be done. States that she is unhappy with the way her feet look at how her toes are aligned and has difficulty fitting shoes. The only shoes that she can wear sandals. Patient denies any lower extremity swelling. Denies any calf pain, denies any chest pain. Admits to joint pain over the involved areas of the right foot. Otherwise no other acute symptoms.   There are no active problems to display for this patient.   Current Outpatient Prescriptions on File Prior to Visit  Medication Sig Dispense Refill  . alendronate (FOSAMAX) 70 MG tablet Take 70 mg by mouth once a week.  4  . atorvastatin (LIPITOR) 10 MG tablet   1  . diazepam (VALIUM) 5 MG tablet   0  . spironolactone-hydrochlorothiazide (ALDACTAZIDE) 25-25 MG per tablet   3  . verapamil (CALAN-SR) 180 MG CR tablet Take 1 tablet (180 mg total) by mouth 2 (two) times daily. 180 tablet 1  . metroNIDAZOLE (METROCREAM) 0.75 % cream   2   No current facility-administered medications on file prior to visit.     Allergies  Allergen Reactions  . Codeine   . Sulfa Antibiotics   . Tape     Objective:  General: Alert and oriented x3 in no acute distress  Dermatology: Small Macerated, keratotic lesion and the fourth webspace of the right foot. Suggestive of heloma molle, no open lesions bilateral lower extremities, no webspace macerations, no ecchymosis bilateral, all nails x 10 are well manicured.  Vascular: Dorsalis Pedis and Posterior Tibial pedal pulses 1/4, Capillary Fill Time 3  seconds,(+) pedal hair growth bilateral, no edema bilateral lower extremities, Temperature gradient within normal limits, mild varicosities bilateral..  Neurology: Gross sensation intact via light touch bilateral.    Musculoskeletal: Significant digital deformity bilateral with the right foot pain, most prevalent past history of surgery with overcorrection and residual hallux varus and lesser hammertoe deformity that is tracking medially that appears reducible, among manipulation. No pain with calf compression bilateral.  Strength within normal limits in all groups bilateral.   Gait: Unassisted, Non-antalgic.  Xrays  Right Foot    Impression: Decrease osseous mineralization. There is significant subluxation of all lesser digits with significant varus deformity of the hallux hardware intact at the first metatarsal, no fracture. Small inferior calcaneal spur soft tissue margins within normal limits. No other acute findings.       Assessment and Plan: Problem List Items Addressed This Visit    None    Visit Diagnoses    Varus deformity of right great toe    -  Primary   Relevant Orders   DG Foot Complete Right   Hammertoe of right foot       Heloma molle       Right foot pain           -Complete examination performed -Xrays reviewed -Discussed treatement optionsFor residual foot deformity after surgery and interdigital lesion on right foot -Had an in-depth conversation with patient the likely only way to correct deformity is a another surgery  to revise the changes that has developed, however, even with revisional surgery There is no guarantee on outcome. Patient opt to try for toe splinting thus a Darco toe splint was dispensed to patient and educated on how to strap the toes to wear at bedtime to see if we can allow some soft tissue manipulation to help with the alignment of her toes -Recommend Betadine in between her fourth interspace and to dry well to prevent worsening of soft corn in  between toes, or infection. -Recommend patient to try toe splint for 6 weeks, then return to office for [place] to have a more in-depth conversation of the pros and cons of revisional foot surgery. -Patient to return to office in 6 weeks or sooner if condition worsens.  Landis Martins, DPM

## 2017-06-10 DIAGNOSIS — M810 Age-related osteoporosis without current pathological fracture: Secondary | ICD-10-CM | POA: Insufficient documentation

## 2017-06-10 DIAGNOSIS — A0472 Enterocolitis due to Clostridium difficile, not specified as recurrent: Secondary | ICD-10-CM | POA: Insufficient documentation

## 2017-06-10 DIAGNOSIS — I252 Old myocardial infarction: Secondary | ICD-10-CM

## 2017-06-10 DIAGNOSIS — G43909 Migraine, unspecified, not intractable, without status migrainosus: Secondary | ICD-10-CM

## 2017-06-10 HISTORY — DX: Enterocolitis due to Clostridium difficile, not specified as recurrent: A04.72

## 2017-06-10 HISTORY — DX: Migraine, unspecified, not intractable, without status migrainosus: G43.909

## 2017-06-10 HISTORY — DX: Old myocardial infarction: I25.2

## 2017-06-15 ENCOUNTER — Other Ambulatory Visit: Payer: Self-pay | Admitting: Sports Medicine

## 2017-06-15 DIAGNOSIS — M2031 Hallux varus (acquired), right foot: Secondary | ICD-10-CM

## 2017-07-15 ENCOUNTER — Ambulatory Visit (INDEPENDENT_AMBULATORY_CARE_PROVIDER_SITE_OTHER): Payer: Medicare Other | Admitting: Sports Medicine

## 2017-07-15 DIAGNOSIS — L84 Corns and callosities: Secondary | ICD-10-CM | POA: Diagnosis not present

## 2017-07-15 DIAGNOSIS — M79671 Pain in right foot: Secondary | ICD-10-CM

## 2017-07-15 DIAGNOSIS — M2041 Other hammer toe(s) (acquired), right foot: Secondary | ICD-10-CM

## 2017-07-15 DIAGNOSIS — M7751 Other enthesopathy of right foot: Secondary | ICD-10-CM

## 2017-07-15 DIAGNOSIS — M21961 Unspecified acquired deformity of right lower leg: Secondary | ICD-10-CM

## 2017-07-15 DIAGNOSIS — M2031 Hallux varus (acquired), right foot: Secondary | ICD-10-CM

## 2017-07-15 MED ORDER — TRIAMCINOLONE ACETONIDE 10 MG/ML IJ SUSP: 10.0000 mg | Freq: Once | INTRAMUSCULAR | Status: DC

## 2017-07-15 MED ORDER — DEXAMETHASONE SODIUM PHOSPHATE 120 MG/30ML IJ SOLN: 4.0000 mg | Freq: Once | INTRAMUSCULAR | Status: DC

## 2017-07-15 NOTE — Progress Notes (Signed)
Subjective: Melissa Lozano is a 76 y.o. female patient who returns to office for evaluation of Right foot pain. Patient states that she is having pain at the corn in between her right fourth and fifth toes. States that she has been using the Betadine as instructed and it seems like the area is drying up and wants to discuss treatment for this on today. Patient is also concerned about the significant digital deformity that she has on her right foot after she has had previous foot surgery with Dr. Gretta Arab patient wants to discuss possible treatment options for this as well. Patient reports that she could not tolerate wearing the splint on her toes.  Patient Active Problem List   Diagnosis Date Noted  . C. difficile colitis 06/10/2017  . Migraine 06/10/2017  . Osteoporosis 06/10/2017  . Old MI (myocardial infarction) 06/10/2017  . Angina pectoris ([place]) 08/16/2015  . 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 Essential hypertension 08/16/2015  . Hyperlipidemia 08/16/2015  . PAT (paroxysmal atrial tachycardia) ([place]) 08/16/2015    Current Outpatient Prescriptions on File Prior to Visit  Medication Sig Dispense Refill  . alendronate (FOSAMAX) 70 MG tablet Take 70 mg by mouth once a week.  4  . aspirin EC 81 MG tablet Take 81 mg by mouth.    Marland Kitchen atorvastatin (LIPITOR) 10 MG tablet   1  . cloNIDine (CATAPRES) 0.1 MG tablet Take 0.1 mg by mouth.    . diazepam (VALIUM) 5 MG tablet   0  . DOCOSAHEXAENOIC ACID PO Take by mouth.    . metroNIDAZOLE (METROCREAM) 0.75 % cream   2  . nitroGLYCERIN (NITROSTAT) 0.4 MG SL tablet Place 0.4 mg under the tongue.    . polyethylene glycol powder (GLYCOLAX/MIRALAX) powder MIX 17GMS (1 CAPFUL) IN 8OZ OF LIQUID AND DRINK ONCE DAILY    . spironolactone-hydrochlorothiazide (ALDACTAZIDE) 25-25 MG per tablet   3  . verapamil (CALAN-SR) 180 MG CR tablet Take 1 tablet (180 mg total) by mouth 2 (two) times daily. 180 tablet 1  . Vitamins-Lipotropics (LIPOFLAVOVIT) TABS Take by  mouth.     No current facility-administered medications on file prior to visit.     Allergies  Allergen Reactions  . Codeine   . Sulfa Antibiotics   . Tape     Objective:  General: Alert and oriented x3 in no acute distress  Dermatology: Small keratotic lesion and the fourth webspace of the right foot. Suggestive of heloma molle now dura, no open lesions bilateral lower extremities, no webspace macerations, no ecchymosis bilateral, all nails x 10 are well manicured.  Vascular: Dorsalis Pedis and Posterior Tibial pedal pulses 1/4, Capillary Fill Time 3 seconds,(+) pedal hair growth bilateral, no edema bilateral lower extremities, Temperature gradient within normal limits, mild varicosities bilateral..  Neurology: Gross sensation intact via light touch bilateral.    Musculoskeletal: Pain with palpation at the fourth webspace right foot, with mild focal inflammation around the keratotic lesion, Significant digital deformity bilateral with the right foot pain, most prevalent past history of surgery with overcorrection and residual hallux varus and lesser hammertoe deformity that is tracking medially that appears reducible, among manipulation. No pain with calf compression bilateral.  Strength within normal limits in all groups bilateral.        Assessment and Plan: Problem List Items Addressed This Visit    None    Visit Diagnoses    Heloma molle    -  Primary   Right foot pain  Hammertoe of right foot       Varus deformity of right great toe       Foot deformity, right           -Complete examination performed -Discussed treatement optionsFor residual foot deformity after surgery and interdigital lesion on right foot -Had an in-depth conversation with patient the likely only way to correct deformity is a another surgery to revise the changes that has developed, however, even with revisional surgery There is no guarantee on outcome. Patient opt to get old records from Dr.  Gretta Arab, so that way I can thoroughly review them to come up with a possible surgical plan. Patient is aware that the outcomes of this type of surgery is unpredictable and that she will need intensive home nursing or even temporary rehabilitation placement. -After oral consent and aseptic prep, injected a mixture containing 0.51ml of 2%  plain lidocaine, 0.5 ml 0.5% plain marcaine, 0.5 ml of kenalog 10 and 0.5 ml of dexamethasone phosphate into right fourth interspace without complication. Then mechanically debrided keratosis at fourth webspace and dispensed foam toe separator.Post-injection care discussed with patient.  -Recommended good supportive shoes -Patient to return to office as needed or sooner if condition worsens. Will call and discuss with patient possible surgical plan for the future once I have reviewed her records that she will obtain from Dr. Areta Haber office.  Landis Martins, DPM

## 2017-07-21 ENCOUNTER — Telehealth: Payer: Self-pay | Admitting: Sports Medicine

## 2017-07-21 ENCOUNTER — Telehealth: Payer: Self-pay | Admitting: *Deleted

## 2017-07-21 NOTE — Telephone Encounter (Signed)
Patient brought old chart and op report from surgery with Dr. Gretta Arab 06-15-2002 by office for my review. I called patient back and discussed her varus, hammertoe, and interdigital corn deformity. Upon review its my medical opinion that patient's bunion was overcorrected and that as a result she now has continued worsening varus and hammertoe deformities. I discussed with patient in order to try to reverse the changes that have happened she will need surgery. I discussed removal of kwires, reverse austin, ehl tendon repair and possible 1st toe akin, 2-5 hammertoe repair with 2-3 capsulotomy and excision of corn at 4th webspace. I made patient aware that for her recovery she will need rolling walker or wheel chair, post op boot with pressure only to the heel, and [facility] home nursing. Patient states that she will think about this and will call office back when she has decided about the surgery. I advised patient that if she wants surgery then she will need to return to office for a surgery consult. Patient expressed understanding and will call the office. -Dr. Cannon Kettle

## 2017-07-21 NOTE — Telephone Encounter (Signed)
-----   Message from Landis Martins, [place] sent at 07/21/2017  8:07 AM EDT ----- Regarding: Chart Review  Can you let patient know that I have reviewed the files that were brought by the office. The files are missing the operative report(s). Let patient know that I got the xrays and the doctors progress notes but what I need to know is what was done in surgery for her foot. I need the operative report(s), to have a better idea of what was done in surgery in order to plan for any future procedures she may need. Thanks Dr. Cannon Kettle

## 2017-07-28 ENCOUNTER — Telehealth: Payer: Self-pay | Admitting: Sports Medicine

## 2017-07-28 NOTE — Telephone Encounter (Signed)
I'll call her after office hours. Thanks Dr.S

## 2017-07-28 NOTE — Telephone Encounter (Signed)
Melissa Lozano wanted to know if you could give her a call around 4:30pm. She had some questions she wanted to ask you.

## 2017-07-30 ENCOUNTER — Telehealth: Payer: Self-pay | Admitting: Sports Medicine

## 2017-07-30 NOTE — Telephone Encounter (Signed)
Call patient back. Patient states that she has decided to have surgery in January. I advised her to call office for surgery consult appoint for sometime in December so that way we can get things planned for January. Patient expressed understanding and states that she will call for an appointment. -Dr. Cannon Kettle

## 2017-07-30 NOTE — Telephone Encounter (Signed)
Patient has questions re: surgery and would like a return call.

## 2017-08-09 IMAGING — MG 2D DIGITAL SCREENING BILATERAL MAMMOGRAM WITH CAD AND ADJUNCT TO
9 of 12 series · 9 of 28 positions shown · non-contrast
Comparison: Previous exam(s).

CLINICAL DATA: Screening.

EXAM:
2D DIGITAL SCREENING BILATERAL MAMMOGRAM WITH CAD AND ADJUNCT TOMO

[R CC synth-2D]
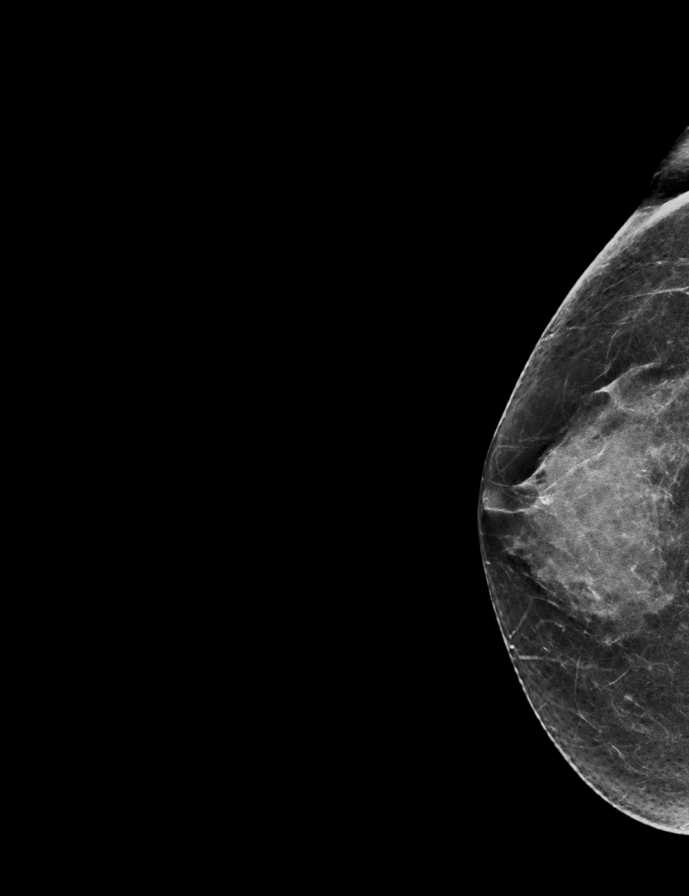

[L MLO]
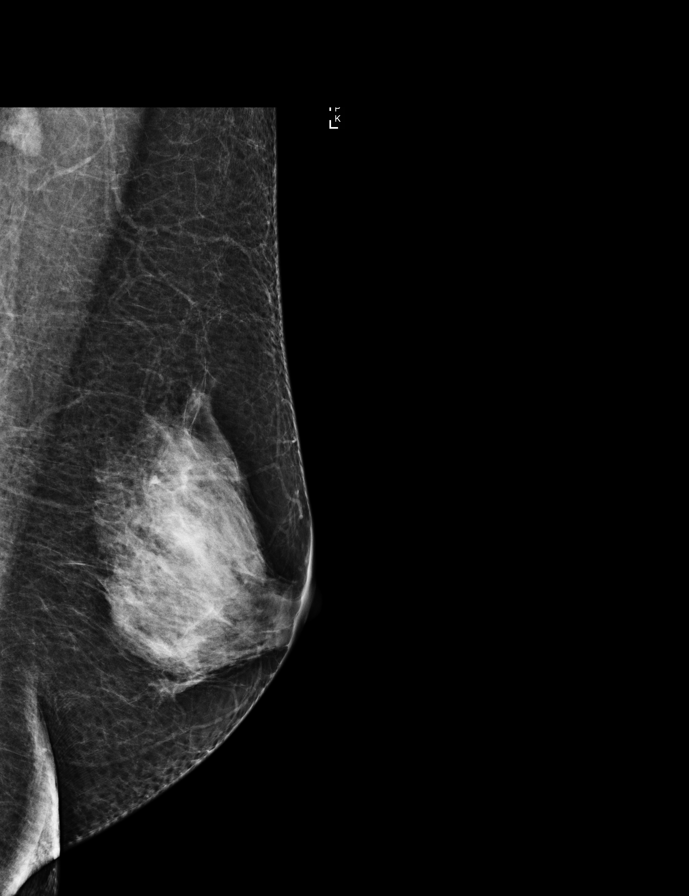

[R MLO]
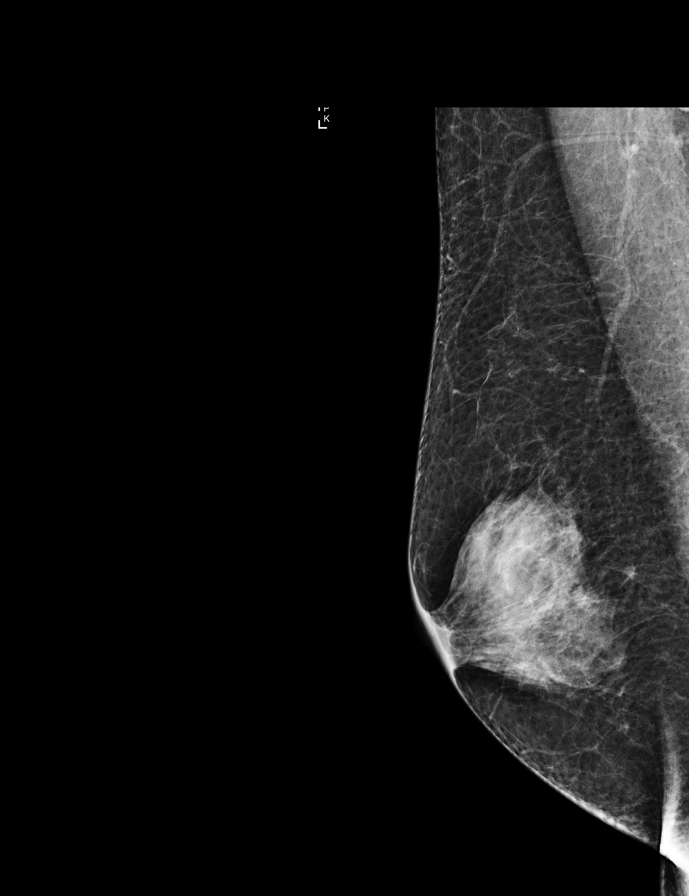

[L CC synth-2D]
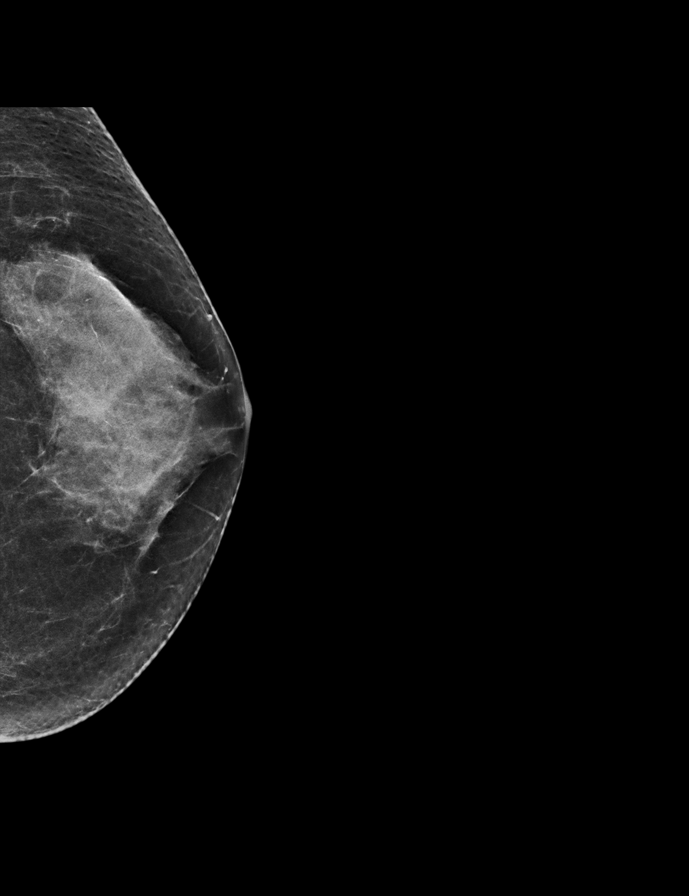

[R MLO synth-2D]
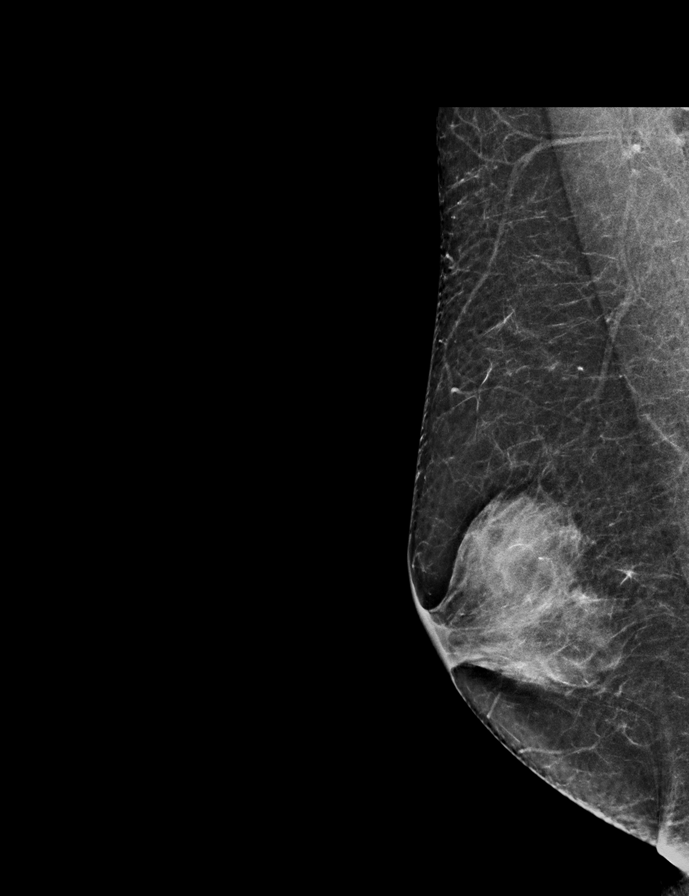

[L MLO synth-2D]
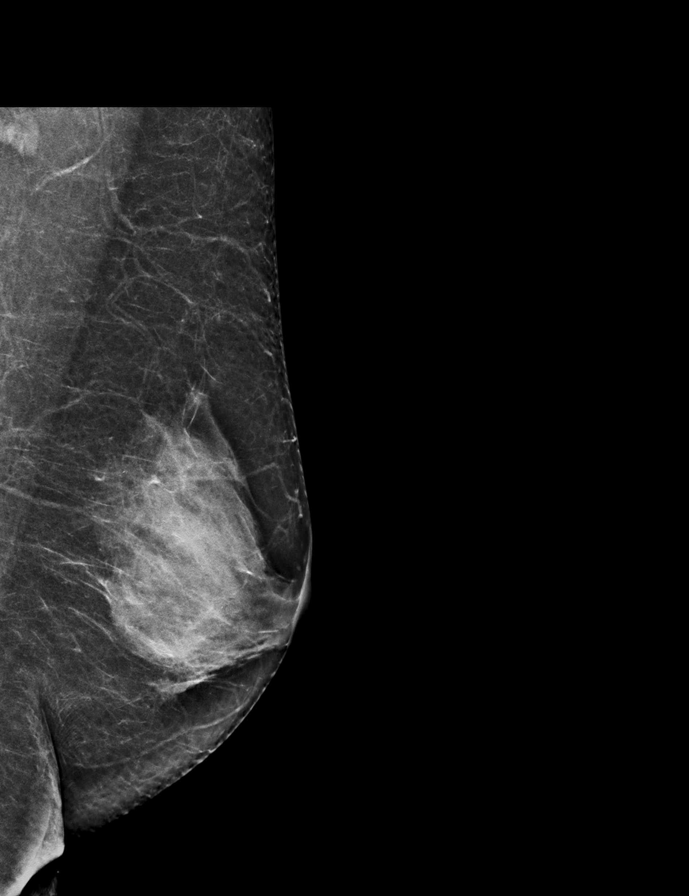

[L CC]
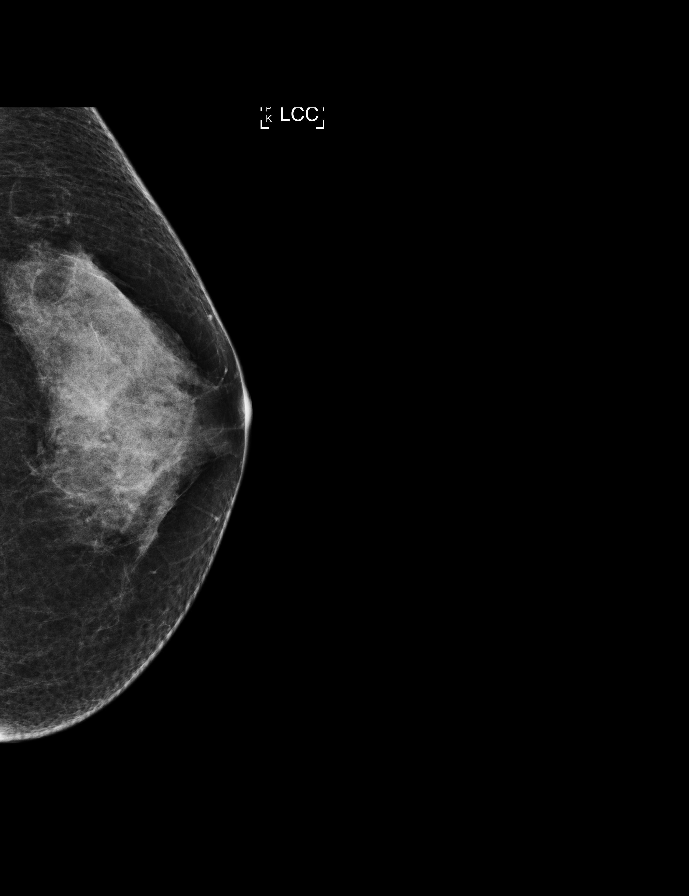

[R CC]
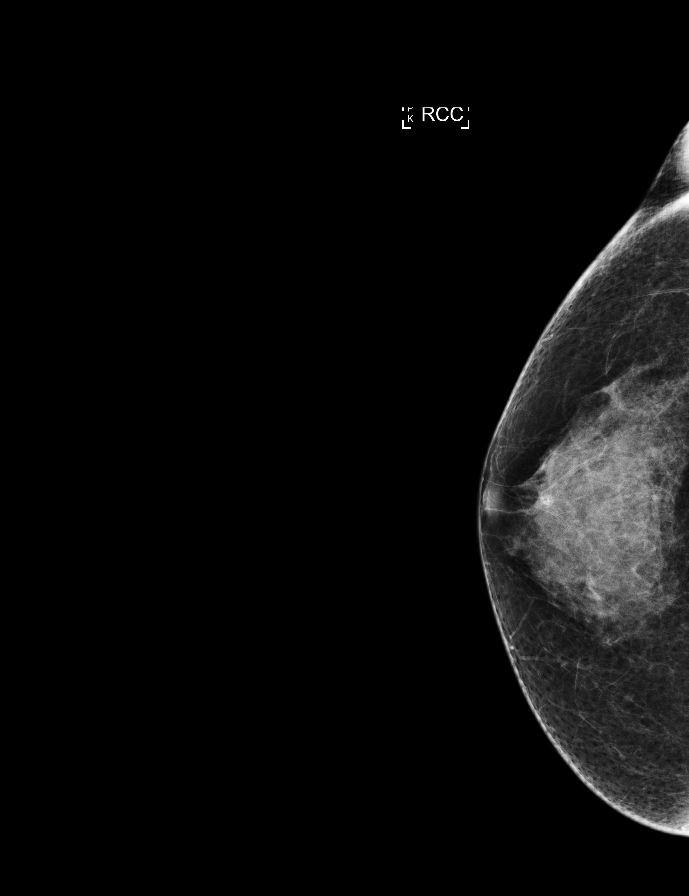

[L CC tomo · tomo slice 31/60.0]
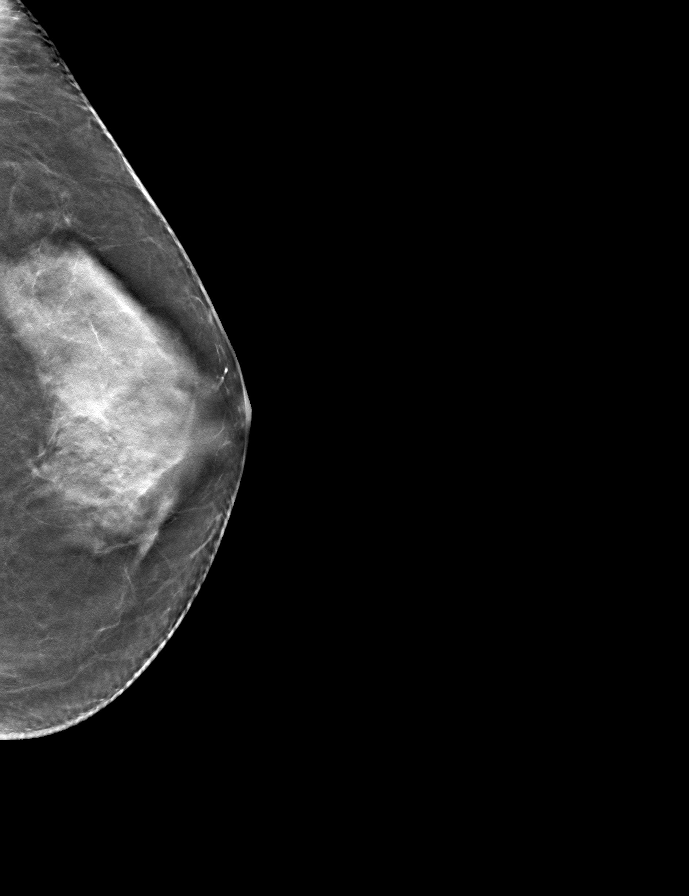

[9 of 28 positions shown; findings below may reference images not displayed]

ACR Breast Density Category c: The breast tissue is heterogeneously
dense, which may obscure small masses.
FINDINGS: There are no findings suspicious for malignancy. Images were
processed with CAD.
IMPRESSION: No mammographic evidence of malignancy. A result letter of this
screening mammogram will be mailed directly to the patient.

RECOMMENDATION:
Screening mammogram in one year. (Code:[id number])

BI-RADS CATEGORY  1: Negative.

## 2017-08-18 ENCOUNTER — Ambulatory Visit: Payer: Medicare Other | Admitting: Sports Medicine

## 2017-08-27 ENCOUNTER — Telehealth: Payer: Self-pay | Admitting: *Deleted

## 2017-08-27 ENCOUNTER — Encounter: Payer: Self-pay | Admitting: Sports Medicine

## 2017-08-27 ENCOUNTER — Ambulatory Visit: Payer: Medicare Other | Admitting: Sports Medicine

## 2017-08-27 DIAGNOSIS — M79671 Pain in right foot: Secondary | ICD-10-CM | POA: Diagnosis not present

## 2017-08-27 DIAGNOSIS — M2031 Hallux varus (acquired), right foot: Secondary | ICD-10-CM

## 2017-08-27 DIAGNOSIS — Z01818 Encounter for other preprocedural examination: Secondary | ICD-10-CM | POA: Diagnosis not present

## 2017-08-27 DIAGNOSIS — M21961 Unspecified acquired deformity of right lower leg: Secondary | ICD-10-CM

## 2017-08-27 DIAGNOSIS — M2011 Hallux valgus (acquired), right foot: Secondary | ICD-10-CM | POA: Diagnosis not present

## 2017-08-27 DIAGNOSIS — L84 Corns and callosities: Secondary | ICD-10-CM | POA: Diagnosis not present

## 2017-08-27 DIAGNOSIS — M7751 Other enthesopathy of right foot: Secondary | ICD-10-CM

## 2017-08-27 DIAGNOSIS — M2041 Other hammer toe(s) (acquired), right foot: Secondary | ICD-10-CM

## 2017-08-27 NOTE — Patient Instructions (Signed)
Pre-Operative Instructions  Congratulations, you have decided to take an important step towards improving your quality of life.  You can be assured that the doctors and staff at [facility] will be with you every step of the way.  Here are some important things you should know:  1. Plan to be at the surgery center/hospital at least 1 (one) hour prior to your scheduled time, unless otherwise directed by the surgical center/hospital staff.  You must have a responsible adult accompany you, remain during the surgery and drive you home.  Make sure you have directions to the surgical center/hospital to ensure you arrive on time. 2. If you are having surgery at [facility] or [facility], you will need a copy of your medical history and physical form from your family physician within one month prior to the date of surgery. We will give you a form for your primary physician to complete.  3. We make every effort to accommodate the date you request for surgery.  However, there are times where surgery dates or times have to be moved.  We will contact you as soon as possible if a change in schedule is required.   4. No aspirin/ibuprofen for one week before surgery.  If you are on aspirin, any non-steroidal anti-inflammatory medications (Mobic, Aleve, Ibuprofen) should not be taken seven (7) days prior to your surgery.  You make take Tylenol for pain prior to surgery.  5. Medications - If you are taking daily heart and blood pressure medications, seizure, reflux, allergy, asthma, anxiety, pain or diabetes medications, make sure you notify the surgery center/hospital before the day of surgery so they can tell you which medications you should take or avoid the day of surgery. 6. No food or drink after midnight the night before surgery unless directed otherwise by surgical center/hospital staff. 7. No alcoholic beverages 24-hours prior to surgery.  No smoking 24-hours prior or 24-hours after  surgery. 8. Wear loose pants or shorts. They should be loose enough to fit over bandages, boots, and casts. 9. Don't wear slip-on shoes. Sneakers are preferred. 10. Bring your boot with you to the surgery center/hospital.  Also bring crutches or a walker if your physician has prescribed it for you.  If you do not have this equipment, it will be provided for you after surgery. 11. If you have not been contacted by the surgery center/hospital by the day before your surgery, call to confirm the date and time of your surgery. 12. Leave-time from work may vary depending on the type of surgery you have.  Appropriate arrangements should be made prior to surgery with your employer. 13. Prescriptions will be provided immediately following surgery by your doctor.  Fill these as soon as possible after surgery and take the medication as directed. Pain medications will not be refilled on weekends and must be approved by the doctor. 14. Remove nail polish on the operative foot and avoid getting pedicures prior to surgery. 15. Wash the night before surgery.  The night before surgery wash the foot and leg well with water and the antibacterial soap provided. Be sure to pay special attention to beneath the toenails and in between the toes.  Wash for at least three (3) minutes. Rinse thoroughly with water and dry well with a towel.  Perform this wash unless told not to do so by your physician.  Enclosed: 1 Ice pack (please put in freezer the night before surgery)   1 Hibiclens skin cleaner     Pre-op instructions  If you have any questions regarding the instructions, please do not hesitate to call our office.  [place]: [address], [place], [place] [address] -- [phone]  [place]: [address] [place], [place] [address] -- [phone]  [place]: [address]  [place], [place] [address] -- [phone]  [place]: [address], [address], [place], [place] [address] -- [phone]  Website:  [email or web address] 

## 2017-08-27 NOTE — Telephone Encounter (Signed)
"  I just saw Dr. Cannon Kettle.  She told me to call you to set up my surgery.  I'd like to do it in January."  Which location did she say you would be having it at?  "She said here at [facility]."  She can do it on January 10.  "That date will be great!  Is there any other instructions?"  You have to have a history and physical completed within 30 days of your surgery date.  "They informed me and gave me the paperwork."  I'll get it scheduled.  You should get a call from the surgical center a day or two prior to the surgery date.

## 2017-08-27 NOTE — Progress Notes (Signed)
Subjective: Melissa Lozano is a 76 y.o. female patient who returns to office for evaluation of Right foot pain. Patient is here to discuss surgical intervention I have thoroughly reviewed Melissa Lozano charts and had previous telephone conversation with her on anticipated surgical approach to help reconstruct her structural deformity.  Patient states that she feels like she needs this surgery because she constantly has pain across her toes and has difficulty with fitting shoes has tried splinting changing of shoes and insoles ,past medications, and trimming with no additional relief.  Patient denies any other acute issues at this time.  Patient Active Problem List   Diagnosis Date Noted  . C. difficile colitis 06/10/2017  . Migraine 06/10/2017  . Osteoporosis 06/10/2017  . Old MI (myocardial infarction) 06/10/2017  . Angina pectoris ([place]) 08/16/2015  . 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 Essential hypertension 08/16/2015  . Hyperlipidemia 08/16/2015  . PAT (paroxysmal atrial tachycardia) ([place]) 08/16/2015    Current Outpatient Medications on File Prior to Visit  Medication Sig Dispense Refill  . alendronate (FOSAMAX) 70 MG tablet Take 70 mg by mouth once a week.  4  . aspirin EC 81 MG tablet Take 81 mg by mouth.    Marland Kitchen atorvastatin (LIPITOR) 10 MG tablet   1  . cloNIDine (CATAPRES) 0.1 MG tablet Take 0.1 mg by mouth.    . diazepam (VALIUM) 5 MG tablet   0  . DOCOSAHEXAENOIC ACID PO Take by mouth.    . metroNIDAZOLE (METROCREAM) 0.75 % cream   2  . nitroGLYCERIN (NITROSTAT) 0.4 MG SL tablet Place 0.4 mg under the tongue.    . polyethylene glycol powder (GLYCOLAX/MIRALAX) powder MIX 17GMS (1 CAPFUL) IN 8OZ OF LIQUID AND DRINK ONCE DAILY    . spironolactone-hydrochlorothiazide (ALDACTAZIDE) 25-25 MG per tablet   3  . verapamil (CALAN-SR) 180 MG CR tablet Take 1 tablet (180 mg total) by mouth 2 (two) times daily. 180 tablet 1  . Vitamins-Lipotropics (LIPOFLAVOVIT) TABS Take by mouth.      Current Facility-Administered Medications on File Prior to Visit  Medication Dose Route Frequency Provider Last Rate Last Dose  . dexamethasone (DECADRON) injection 4 mg  4 mg Intra-articular Once Celenia Hruska, DPM      . triamcinolone acetonide (KENALOG) 10 MG/ML injection 10 mg  10 mg Other Once Landis Martins, DPM        Allergies  Allergen Reactions  . Codeine   . Sulfa Antibiotics   . Tape    Past Surgical History:  Procedure Laterality Date  . ABDOMINAL HYSTERECTOMY    . CARDIAC CATHETERIZATION     Family History  Problem Relation Age of Onset  . Aneurysm Father     Objective:  General: Alert and oriented x3 in no acute distress  Dermatology: Small keratotic lesion and the fourth webspace of the right foot. Suggestive of heloma dura, no open lesions bilateral lower extremities, no webspace macerations, no ecchymosis bilateral, all nails x 10 are well manicured.  Vascular: Dorsalis Pedis and Posterior Tibial pedal pulses 1/4, Capillary Fill Time 3 seconds,(+) pedal hair growth bilateral, no edema bilateral lower extremities, Temperature gradient within normal limits, mild varicosities bilateral.  Previous subjective swelling in the ankles that improves with elevation or by the end of the day.  Neurology: Johney Maine sensation intact via light touch bilateral.    Musculoskeletal: Pain with palpation at the fourth webspace right foot, with mild decreased focal inflammation around the keratotic lesion, Significant digital deformity bilateral with the  right foot pain, most prevalent past history of surgery with overcorrection and residual hallux varus and lesser hammertoe deformity that is tracking medially that appears reducible, among manipulation with mild tendon contracture of the EHL. No pain with calf compression bilateral.  Strength within normal limits in all groups bilateral.        Assessment and Plan: Problem List Items Addressed This Visit    None    Visit  Diagnoses    Varus deformity of right great toe    -  Primary   Hallux interphalangeus, acquired, right       Capsulitis of toe of right foot       Hammertoe of right foot       Right foot pain       Foot deformity, right       Heloma molle       Preop examination           -Complete examination performed -Discussed treatement options for residual foot deformity after surgery and interdigital lesion on right foot -Patient opt for surgical management. Consent obtained for Right hardware removal, reverse austin bunionectomy with internal fixation, akin with internal fixation, EHL tendon repair, 2-5 hammertoe repair with kwire, 2-3rd capsulotomies, excision of interspace corn at 4th webspace on right foot . Pre and Post op course explained. Risks, benefits, alternatives explained. No guarantees given or implied. Surgical booking slip submitted and provided patient with Surgical packet and info for [facility]. -Patient will need medical [facility] Bali) and cardiac [facility]) clearance, previous history of MI; H&P form given -Dispensed CAM Walker to use post op, weightbearing to heel with walker  -Rx [facility] home nusing for preoperative evaluation for home needs of physical therapy occupational therapy and a home health aide.  [facility] to also provide postoperative care consisting of twice weekly dressing changes consisting of Adaptic to surgical site and local external K wire pin care -Advised patient that she will need transportation to and from the office for office visits and will need transportation on the day of surgery, patient states that she will see if she can get a friend to help with this. -All patient's questions answered -Patient to return to office after surgery or sooner if condition worsens  Landis Martins, DPM

## 2017-08-30 ENCOUNTER — Ambulatory Visit: Payer: Medicare Other | Admitting: Cardiology

## 2017-08-31 NOTE — Progress Notes (Deleted)
Cardiology Office Note:    Date:  08/31/2017   ID:  Melissa Lozano, DOB 05-07-1941, MRN [id number]  PCP:  Nicoletta Dress, MD  Cardiologist:  Shirlee More, MD    Referring MD: Nicoletta Dress, MD    ASSESSMENT:    1. Coronary artery spasm ([place])   2. Essential hypertension   3. PAT (paroxysmal atrial tachycardia) ([place])   4. Hyperlipidemia, unspecified hyperlipidemia type    PLAN:    In order of problems listed above:  1. ***   Next appointment: ***   Medication Adjustments/Labs and Tests Ordered: Current medicines are reviewed at length with the patient today.  Concerns regarding medicines are outlined above.  No orders of the defined types were placed in this encounter.  No orders of the defined types were placed in this encounter.   No chief complaint on file.   History of Present Illness:    Melissa Lozano is a 76 y.o. female with a hx of Coronary artery spasm, essential hypertension, hyperlipidemia and PAT  last seen one year ago. Compliance with diet, lifestyle and medications: *** Past Medical History:  Diagnosis Date  . Angina pectoris ([place]) 08/16/2015   Overview:  stable, no recurrence of her present meds and continue same with a CCB, aspirin and a statin. Stress echo negative for ischemia at 10 mets 08/23/13  . C. difficile colitis 06/10/2017  . 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 Essential hypertension 08/16/2015  . Hyperlipidemia 08/16/2015  . Hypertension   . Migraine 06/10/2017  . 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 Osteoporosis   . PAT (paroxysmal atrial tachycardia) ([place]) 08/16/2015    Past Surgical History:  Procedure Laterality Date  . ABDOMINAL HYSTERECTOMY    . CARDIAC CATHETERIZATION      Current Medications: No outpatient medications have been marked as taking for the 09/01/17 encounter (Appointment) with Richardo Priest, MD.   Current  Facility-Administered Medications for the 09/01/17 encounter (Appointment) with Richardo Priest, MD  Medication  . dexamethasone (DECADRON) injection 4 mg  . triamcinolone acetonide (KENALOG) 10 MG/ML injection 10 mg     Allergies:   Codeine; Sulfa antibiotics; and Tape   Social History   Socioeconomic History  . Marital status: Married    Spouse name: Not on file  . Number of children: Not on file  . Years of education: Not on file  . Highest education level: Not on file  Social Needs  . Financial resource strain: Not on file  . Food insecurity - worry: Not on file  . Food insecurity - inability: Not on file  . Transportation needs - medical: Not on file  . Transportation needs - non-medical: Not on file  Occupational History  . Not on file  Tobacco Use  . Smoking status: Never Smoker  . Smokeless tobacco: Never Used  Substance and Sexual Activity  . Alcohol use: No    Alcohol/week: 0.0 oz    Comment: occasional  . Drug use: No  . Sexual activity: Not on file  Other Topics Concern  . Not on file  Social History Narrative  . Not on file     Family History: The patient's ***family history includes Aneurysm in her father. ROS:   Please see the history of present illness.    All other systems reviewed and are negative.  EKGs/Labs/Other Studies Reviewed:    The following studies were  reviewed today:  EKG:  EKG ordered today.  The ekg ordered today demonstrates ***  Recent Labs: No results found for requested labs within last 8760 hours.  Recent Lipid Panel No results found for: CHOL, TRIG, HDL, CHOLHDL, VLDL, LDLCALC, LDLDIRECT  Physical Exam:    VS:  There were no vitals taken for this visit.    Wt Readings from Last 3 Encounters:  06/04/17 147 lb (66.7 kg)     GEN: *** Well nourished, well developed in no acute distress HEENT: Normal NECK: No JVD; No carotid bruits LYMPHATICS: No lymphadenopathy CARDIAC: ***RRR, no murmurs, rubs, gallops RESPIRATORY:   Clear to auscultation without rales, wheezing or rhonchi  ABDOMEN: Soft, non-tender, non-distended MUSCULOSKELETAL:  No edema; No deformity  SKIN: Warm and dry NEUROLOGIC:  Alert and oriented x 3 PSYCHIATRIC:  Normal affect    Signed, Shirlee More, MD  08/31/2017 8:34 AM    [place]

## 2017-09-01 ENCOUNTER — Encounter (INDEPENDENT_AMBULATORY_CARE_PROVIDER_SITE_OTHER): Payer: Self-pay

## 2017-09-01 ENCOUNTER — Ambulatory Visit: Payer: Medicare Other | Admitting: Cardiology

## 2017-09-01 ENCOUNTER — Encounter: Payer: Self-pay | Admitting: Cardiology

## 2017-09-01 VITALS — BP 130/80 | HR 64 | Ht 63.0 in | Wt 152.0 lb

## 2017-09-01 DIAGNOSIS — I201 Angina pectoris with documented spasm: Secondary | ICD-10-CM

## 2017-09-01 DIAGNOSIS — I10 Essential (primary) hypertension: Secondary | ICD-10-CM | POA: Diagnosis not present

## 2017-09-01 DIAGNOSIS — I471 Supraventricular tachycardia: Secondary | ICD-10-CM | POA: Diagnosis not present

## 2017-09-01 DIAGNOSIS — E785 Hyperlipidemia, unspecified: Secondary | ICD-10-CM | POA: Diagnosis not present

## 2017-09-01 NOTE — Progress Notes (Signed)
Cardiology Office Note:    Date:  09/01/2017   ID:  Melissa Lozano, DOB 1941-04-11, MRN [id number]  PCP:  Nicoletta Dress, MD  Cardiologist:  Shirlee More, MD    Referring MD: Nicoletta Dress, MD    ASSESSMENT:    1. Coronary artery spasm ([place])   2. Essential hypertension   3. PAT (paroxysmal atrial tachycardia) ([place])   4. Hyperlipidemia, unspecified hyperlipidemia type    PLAN:    In order of problems listed above:  1. Stable continue treatment with aspirin statin and calcium channel blocker in view of her previous coronary artery spasm induced myocardial infarction. 2. Stable blood pressure target with current treatment continue multiple drug regimen including calcium channel blocker diuretic including Spironolactone and clonidine as needed. 3. Stable no recurrence continue calcium channel blocker 4. Stable continue her statin recent lipids and liver function requested from PCP for efficacy and to screen for liver toxicity.  She is having no symptoms of muscle toxicity from her statin.   Next appointment: One year   Medication Adjustments/Labs and Tests Ordered: Current medicines are reviewed at length with the patient today.  Concerns regarding medicines are outlined above.  No orders of the defined types were placed in this encounter.  No orders of the defined types were placed in this encounter.   Chief Complaint  Patient presents with  . Annual Exam    for coronary artery spasm, hypertension and PAT    History of Present Illness:    Melissa Lozano is a 76 y.o. female with a hx of Coronary artery spasm, essential hypertension, hyperlipidemia and PAT last seen one year ago. Compliance with diet, lifestyle and medications: Yes She anticipates podiatry surgery in the next month.  She is at low risk can stop her statin for a week before and several days after surgery and continue her usual cardiac medications.  She is still grieving after her husband's death but  has had no angina dyspnea palpitations or syncope and her blood pressure has been well controlled. Past Medical History:  Diagnosis Date  . Angina pectoris ([place]) 08/16/2015   Overview:  stable, no recurrence of her present meds and continue same with a CCB, aspirin and a statin. Stress echo negative for ischemia at 10 mets 08/23/13  . C. difficile colitis 06/10/2017  . 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 Essential hypertension 08/16/2015  . Hyperlipidemia 08/16/2015  . Hypertension   . Migraine 06/10/2017  . 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 Osteoporosis   . PAT (paroxysmal atrial tachycardia) ([place]) 08/16/2015    Past Surgical History:  Procedure Laterality Date  . ABDOMINAL HYSTERECTOMY    . CARDIAC CATHETERIZATION      Current Medications: Current Meds  Medication Sig  . alendronate (FOSAMAX) 70 MG tablet Take 70 mg by mouth once a week.  Marland Kitchen aspirin EC 81 MG tablet Take 81 mg by mouth.  Marland Kitchen atorvastatin (LIPITOR) 10 MG tablet   . Calcium Carb-Ergocalciferol 500-200 MG-UNIT TABS Take by mouth.  . cloNIDine (CATAPRES) 0.1 MG tablet Take 0.1 mg by mouth.  . diazepam (VALIUM) 5 MG tablet   . DOCOSAHEXAENOIC ACID PO Take by mouth.  . metroNIDAZOLE (METROCREAM) 0.75 % cream   . Multiple Vitamin (MULTI-VITAMINS) TABS Take by mouth.  . nitroGLYCERIN (NITROSTAT) 0.4 MG SL tablet Place 0.4 mg under the tongue.  . polyethylene glycol powder (GLYCOLAX/MIRALAX) powder MIX  17GMS (1 CAPFUL) IN 8OZ OF LIQUID AND DRINK ONCE DAILY  . psyllium (METAMUCIL) 58.6 % packet Take 1 packet by mouth daily.  Marland Kitchen spironolactone-hydrochlorothiazide (ALDACTAZIDE) 25-25 MG per tablet   . SUMAtriptan (IMITREX) 25 MG tablet Take 25 mg by mouth.  . verapamil (CALAN-SR) 180 MG CR tablet Take 1 tablet (180 mg total) by mouth 2 (two) times daily.  . Vitamins-Lipotropics (LIPOFLAVOVIT) TABS Take by mouth.   Current  Facility-Administered Medications for the 09/01/17 encounter (Office Visit) with Richardo Priest, MD  Medication  . dexamethasone (DECADRON) injection 4 mg  . triamcinolone acetonide (KENALOG) 10 MG/ML injection 10 mg     Allergies:   Codeine; Sulfa antibiotics; and Tape   Social History   Socioeconomic History  . Marital status: Married    Spouse name: None  . Number of children: None  . Years of education: None  . Highest education level: None  Social Needs  . Financial resource strain: None  . Food insecurity - worry: None  . Food insecurity - inability: None  . Transportation needs - medical: None  . Transportation needs - non-medical: None  Occupational History  . None  Tobacco Use  . Smoking status: Never Smoker  . Smokeless tobacco: Never Used  Substance and Sexual Activity  . Alcohol use: No    Alcohol/week: 0.0 oz    Comment: occasional  . Drug use: No  . Sexual activity: None  Other Topics Concern  . None  Social History Narrative  . None     Family History: The patient's family history includes Aneurysm in her father. ROS:   Please see the history of present illness.    All other systems reviewed and are negative.  EKGs/Labs/Other Studies Reviewed:    The following studies were reviewed today:  EKG:  EKG ordered today.  The ekg ordered today demonstrates [place] normal  Recent Labs: No results found for requested labs within last 8760 hours.  Recent Lipid Panel No results found for: CHOL, TRIG, HDL, CHOLHDL, VLDL, LDLCALC, LDLDIRECT  Physical Exam:    VS:  BP 130/80 (BP Location: Right Arm, Patient Position: Sitting, Cuff Size: Normal)   Pulse 64   Ht 5\' 3"  (1.6 m)   Wt 152 lb (68.9 kg)   SpO2 97%   BMI 26.93 kg/m     Wt Readings from Last 3 Encounters:  09/01/17 152 lb (68.9 kg)  06/04/17 147 lb (66.7 kg)     GEN:  Well nourished, well developed in no acute distress HEENT: Normal NECK: No JVD; No carotid bruits LYMPHATICS: No  lymphadenopathy CARDIAC: RRR, no murmurs, rubs, gallops RESPIRATORY:  Clear to auscultation without rales, wheezing or rhonchi  ABDOMEN: Soft, non-tender, non-distended MUSCULOSKELETAL:  No edema; No deformity  SKIN: Warm and dry NEUROLOGIC:  Alert and oriented x 3 PSYCHIATRIC:  Normal affect    Signed, Shirlee More, MD  09/01/2017 12:27 PM    [facility]

## 2017-09-01 NOTE — Patient Instructions (Addendum)
Medication Instructions:  Your physician recommends that you continue on your current medications as directed. Please refer to the Current Medication list given to you today.   Labwork: None  Testing/Procedures: None  Follow-Up: Your physician wants you to follow-up in: 1 year.  You will receive a reminder letter in the mail two months in advance. If you don't receive a letter, please call our office to schedule the follow-up appointment.   Any Other Special Instructions Will Be Listed Below (If Applicable).     If you need a refill on your cardiac medications before your next appointment, please call your pharmacy.     Stop your aspirin 1 week prior to surgery   Ask afterwards when to resume

## 2017-09-02 NOTE — Addendum Note (Signed)
Addended by: Warner Mccreedy E on: 09/02/2017 10:20 AM   Modules accepted: Orders

## 2017-09-07 ENCOUNTER — Other Ambulatory Visit: Payer: Self-pay

## 2017-09-07 MED ORDER — ATORVASTATIN CALCIUM 10 MG PO TABS: 10.0000 mg | ORAL_TABLET | ORAL | 3 refills | Status: DC

## 2017-09-09 ENCOUNTER — Ambulatory Visit: Payer: Medicare Other | Admitting: Cardiology

## 2017-09-09 ENCOUNTER — Other Ambulatory Visit: Payer: Self-pay | Admitting: *Deleted

## 2017-09-09 ENCOUNTER — Other Ambulatory Visit: Payer: Self-pay | Admitting: Internal Medicine

## 2017-09-09 DIAGNOSIS — M2031 Hallux varus (acquired), right foot: Secondary | ICD-10-CM

## 2017-09-09 DIAGNOSIS — Z139 Encounter for screening, unspecified: Secondary | ICD-10-CM

## 2017-09-09 DIAGNOSIS — M2041 Other hammer toe(s) (acquired), right foot: Secondary | ICD-10-CM

## 2017-09-09 NOTE — Progress Notes (Signed)
I called and informed Dian Situ and Marita Kansas at [facility] that an order was placed for Ms. Melissa Lozano.  Patient's surgery will be on 09/30/2017.  Service is needed on 10/01/2017.

## 2017-09-10 ENCOUNTER — Encounter: Payer: Self-pay | Admitting: *Deleted

## 2017-09-15 ENCOUNTER — Telehealth: Payer: Self-pay | Admitting: *Deleted

## 2017-09-15 NOTE — Telephone Encounter (Signed)
Received faxed notice stating Dr. Bettina Gavia was no longer with office. I called for Dr. Joya Gaskins current office fax. Faxed request for Medical Clearance to [facility] [place].

## 2017-09-20 ENCOUNTER — Encounter: Payer: Self-pay | Admitting: Sports Medicine

## 2017-09-20 NOTE — Progress Notes (Signed)
Got approval from Dr. Shirlee More for pt to have surgery.

## 2017-09-23 ENCOUNTER — Telehealth: Payer: Self-pay | Admitting: *Deleted

## 2017-09-23 NOTE — Telephone Encounter (Signed)
Pt states she is calling to check the status of what preparation is being made for her post op care.

## 2017-09-28 ENCOUNTER — Telehealth: Payer: Self-pay | Admitting: *Deleted

## 2017-09-28 NOTE — Telephone Encounter (Signed)
Jeannie Done - [facility] gave [facility] Loz[facility] [phone]. I spoke with Melissa Lozano, she states pt informed her she did not want to go into a facility after surgery, she had a caregiver for after surgery. [facility] states she has post op wound and PT orders for pt. I informed Dr. Cannon Kettle of Amanda's statement.

## 2017-09-29 ENCOUNTER — Telehealth: Payer: Self-pay | Admitting: *Deleted

## 2017-09-29 NOTE — Telephone Encounter (Signed)
"  We have Melissa Lozano on our schedule for tomorrow.  It says on her sheet that she needed medial clearance and Cardiac clearance.  Do you have that?"  We do, Dr. Cannon Kettle has it in the patient's chart.  "Could you fax that to [place]?"  I will get a message to Dr. Cannon Kettle in the [place] office and get them to fax it to you.  What's your fax number?  It's [phone].

## 2017-09-29 NOTE — Telephone Encounter (Signed)
Faxed by Darrick Penna

## 2017-09-30 ENCOUNTER — Encounter: Payer: Self-pay | Admitting: Sports Medicine

## 2017-09-30 DIAGNOSIS — M6700 Short Achilles tendon (acquired), unspecified ankle: Secondary | ICD-10-CM | POA: Diagnosis not present

## 2017-09-30 DIAGNOSIS — M7751 Other enthesopathy of right foot: Secondary | ICD-10-CM | POA: Diagnosis not present

## 2017-09-30 DIAGNOSIS — M2041 Other hammer toe(s) (acquired), right foot: Secondary | ICD-10-CM

## 2017-09-30 DIAGNOSIS — D485 Neoplasm of uncertain behavior of skin: Secondary | ICD-10-CM | POA: Diagnosis not present

## 2017-09-30 DIAGNOSIS — M2011 Hallux valgus (acquired), right foot: Secondary | ICD-10-CM | POA: Diagnosis not present

## 2017-09-30 DIAGNOSIS — Z4889 Encounter for other specified surgical aftercare: Secondary | ICD-10-CM | POA: Diagnosis not present

## 2017-10-01 ENCOUNTER — Telehealth: Payer: Self-pay | Admitting: Sports Medicine

## 2017-10-01 NOTE — Telephone Encounter (Signed)
Post op check phone call made to patient. Patient reports that she is starting to feel some pain. I advised to continue with rest, ice, elevation and taking pain medications as Rx. Patient confirms that a nurse will be out to her house to check on her at 10 am. I advised patient that this is what I have ordered to make sure her dressings are cared for and that she is safe at home when attempting to partial weight-bear. Patient expressed understanding and asked over the weekend but number should she call if she has an emergency.  I advised patient to call our office number to be directed to our on call 24 hours number or to look at the bottom of her discharge papers for this information.  Patient expressed understanding.  Patient to follow-up as scheduled for continued postoperative care next week.  Dr. Cannon Kettle

## 2017-10-05 NOTE — Progress Notes (Signed)
DOS 01.10.2019 Right foot reconstruction (removal of hardware, reverse Austin bunionectomy, 1st toe osteotomy Akin), tendon lengthening/repair, Hammertoe repairs with possible kwires, capsulotomy & removal of corn at 4th webspace.

## 2017-10-06 ENCOUNTER — Encounter: Payer: Self-pay | Admitting: Sports Medicine

## 2017-10-06 ENCOUNTER — Ambulatory Visit (INDEPENDENT_AMBULATORY_CARE_PROVIDER_SITE_OTHER): Payer: Medicare Other | Admitting: Sports Medicine

## 2017-10-06 ENCOUNTER — Ambulatory Visit (INDEPENDENT_AMBULATORY_CARE_PROVIDER_SITE_OTHER): Payer: Medicare Other

## 2017-10-06 DIAGNOSIS — M79671 Pain in right foot: Secondary | ICD-10-CM

## 2017-10-06 DIAGNOSIS — M2041 Other hammer toe(s) (acquired), right foot: Secondary | ICD-10-CM

## 2017-10-06 DIAGNOSIS — M2011 Hallux valgus (acquired), right foot: Secondary | ICD-10-CM

## 2017-10-06 DIAGNOSIS — M2031 Hallux varus (acquired), right foot: Secondary | ICD-10-CM

## 2017-10-06 DIAGNOSIS — Z9889 Other specified postprocedural states: Secondary | ICD-10-CM

## 2017-10-06 MED ORDER — CIPROFLOXACIN HCL 500 MG PO TABS: 500.0000 mg | ORAL_TABLET | Freq: Two times a day (BID) | ORAL | 0 refills | Status: DC

## 2017-10-06 MED ORDER — OXYCODONE-ACETAMINOPHEN 10-325 MG PO TABS: 1.0000 | ORAL_TABLET | Freq: Three times a day (TID) | ORAL | 0 refills | Status: DC | PRN

## 2017-10-06 NOTE — Progress Notes (Signed)
Subjective: Melissa Lozano is a 77 y.o. female patient seen today in office for POV #1 (DOS 09-27-17), S/P right reverse Austin bunionectomy Akin osteotomy removal of hardware 2 through 5 hammertoe repair capsulotomy 2 and 3 with excision of interdigital lesion Fourth webspace. Patient admits pain and sensitivity at surgical site, states that her blood pressure was up for today and that she has developed a fever blister on her lip however denies any other acute symptoms, denies calf pain, denies headache, chest pain, shortness of breath, nausea, vomiting, fever, or chills. Patient is assisted by her friend who states that she has not been as compliant with icing and elevating and also has been doing a little bit more than recommended.  Patient also admits to episode of feeling itchy that is not bad however she does notices it with taking pain medication. No other issues noted.   Patient Active Problem List   Diagnosis Date Noted  . C. difficile colitis 06/10/2017  . Migraine 06/10/2017  . Osteoporosis 06/10/2017  . Old MI (myocardial infarction) 06/10/2017  . Angina pectoris ([place]) 08/16/2015  . Coronary artery spasm ([place]) 08/16/2015  . Essential hypertension 08/16/2015  . Hyperlipidemia 08/16/2015  . PAT (paroxysmal atrial tachycardia) ([place]) 08/16/2015    Current Outpatient Medications on File Prior to Visit  Medication Sig Dispense Refill  . alendronate (FOSAMAX) 70 MG tablet Take 70 mg by mouth once a week.  4  . aspirin EC 81 MG tablet Take 81 mg by mouth.    Marland Kitchen atorvastatin (LIPITOR) 10 MG tablet Take 1 tablet (10 mg total) by mouth 3 (three) times a week. 90 tablet 3  . Calcium Carb-Ergocalciferol 500-200 MG-UNIT TABS Take by mouth.    . cloNIDine (CATAPRES) 0.1 MG tablet Take 0.1 mg by mouth.    . diazepam (VALIUM) 5 MG tablet   0  . DOCOSAHEXAENOIC ACID PO Take by mouth.    . docusate sodium (COLACE) 100 MG capsule Take 100 mg by mouth 2 (two) times daily.    . metroNIDAZOLE (METROCREAM)  0.75 % cream   2  . Multiple Vitamin (MULTI-VITAMINS) TABS Take by mouth.    . nitroGLYCERIN (NITROSTAT) 0.4 MG SL tablet Place 0.4 mg under the tongue.    . polyethylene glycol powder (GLYCOLAX/MIRALAX) powder MIX 17GMS (1 CAPFUL) IN 8OZ OF LIQUID AND DRINK ONCE DAILY    . promethazine (PHENERGAN) 25 MG tablet Take 25 mg by mouth every 8 (eight) hours as needed for nausea or vomiting (Take 1 tablet by mouth every 8 hours as needed for nausea).    . psyllium (METAMUCIL) 58.6 % packet Take 1 packet by mouth daily.    Marland Kitchen spironolactone-hydrochlorothiazide (ALDACTAZIDE) 25-25 MG per tablet   3  . SUMAtriptan (IMITREX) 25 MG tablet Take 25 mg by mouth.    . verapamil (CALAN-SR) 180 MG CR tablet Take 1 tablet (180 mg total) by mouth 2 (two) times daily. 180 tablet 1  . Vitamins-Lipotropics (LIPOFLAVOVIT) TABS Take by mouth.     Current Facility-Administered Medications on File Prior to Visit  Medication Dose Route Frequency Provider Last Rate Last Dose  . dexamethasone (DECADRON) injection 4 mg  4 mg Intra-articular Once Corazon Nickolas, DPM      . triamcinolone acetonide (KENALOG) 10 MG/ML injection 10 mg  10 mg Other Once Landis Martins, DPM        Allergies  Allergen Reactions  . Codeine   . Sulfa Antibiotics   . Tape     Objective:  There were no vitals filed for this visit.  General: No acute distress, AAOx3  Right foot: K wire and sutures intact with no gapping or dehiscence at surgical site, there is moderate swelling and redness focal to the right forefoot,  no warmth, no drainage, no other signs of infection noted, Capillary fill time <3 seconds in all digits with K wire intact, gross sensation present via light touch to right foot with some degree of hypersensitivity.  No pain with calf compression.   Post Op Xray, Right foot: K wire intact. Osteotomy site healing with improved positioning compared to preop. Hardware intact. Soft tissue swelling within normal limits for post op  status.   Assessment and Plan:  Problem List Items Addressed This Visit    None    Visit Diagnoses    Varus deformity of right great toe    -  Primary   Relevant Orders   DG Foot Complete Right (Completed)   Hallux interphalangeus, acquired, right       Relevant Orders   DG Foot Complete Right (Completed)   Hammertoe of right foot       Relevant Orders   DG Foot Complete Right (Completed)   Right foot pain       S/P foot surgery, right       Relevant Medications   ciprofloxacin (CIPRO) 500 MG tablet       -Patient seen and evaluated -X-rays reviewed -Applied dry sterile dressing to surgical site right foot secured with ACE wrap and stockinet  -Advised patient to make sure to keep dressings clean, dry, and intact to right surgical site, allowing [facility] to change dressings as instructed -Advised patient to continue with CAM boot and walker with pressure to right heel only  -Advised patient to limit activity to necessity  -Advised patient to ice and elevate -Refill pain medication and advised patient to take half a tablet to help with itchy sensation and to take Benadryl if needed if symptoms persist to call office so that way we can change her pain pill to a different one -Prescribed ciprofloxacin for preventative measures -Will plan for possible suture removal at next office visit. In the meantime, patient to call office if any issues or problems arise.   Landis Martins, DPM

## 2017-10-07 ENCOUNTER — Telehealth: Payer: Self-pay | Admitting: Sports Medicine

## 2017-10-07 NOTE — Telephone Encounter (Signed)
Spoke with patient relayed Dr Leeanne Rio message that due to the swelling and redness she does not want her to restart the Aspirin until she sees her for her next post op appointment.  Patient stated understanding.

## 2017-10-07 NOTE — Telephone Encounter (Signed)
I had foot surgery done on 30 September 2017. I take baby Asprin and I stopped taking that prior to the surgery as I was instructed. I have not been told when I can start taking the baby Asprin again and would like to know when I can resume taking it. Please call me back at [phone]. Thank you.

## 2017-10-13 ENCOUNTER — Ambulatory Visit (INDEPENDENT_AMBULATORY_CARE_PROVIDER_SITE_OTHER): Payer: Medicare Other | Admitting: Sports Medicine

## 2017-10-13 ENCOUNTER — Encounter: Payer: Self-pay | Admitting: Sports Medicine

## 2017-10-13 DIAGNOSIS — M2041 Other hammer toe(s) (acquired), right foot: Secondary | ICD-10-CM

## 2017-10-13 DIAGNOSIS — Z9889 Other specified postprocedural states: Secondary | ICD-10-CM

## 2017-10-13 DIAGNOSIS — M2011 Hallux valgus (acquired), right foot: Secondary | ICD-10-CM

## 2017-10-13 DIAGNOSIS — M79671 Pain in right foot: Secondary | ICD-10-CM

## 2017-10-13 DIAGNOSIS — M2031 Hallux varus (acquired), right foot: Secondary | ICD-10-CM

## 2017-10-13 NOTE — Progress Notes (Signed)
Subjective: Melissa Lozano is a 77 y.o. female patient seen today in office for POV #2 (DOS 09-27-17), S/P right reverse Austin bunionectomy Akin osteotomy removal of hardware 2 through 5 hammertoe repair capsulotomy 2 and 3 with excision of interdigital lesion Fourth webspace. Patient admits she had to remove the coflex around her ankle that the nurse because it was itchy and reports that she still have itchiness all over even after stopping pain medication, denies calf pain, denies headache, chest pain, shortness of breath, nausea, vomiting, fever, or chills. Patient is assisted by her friend who states that she saw patient driving. No other issues noted.   Patient Active Problem List   Diagnosis Date Noted  . C. difficile colitis 06/10/2017  . Migraine 06/10/2017  . Osteoporosis 06/10/2017  . Old MI (myocardial infarction) 06/10/2017  . Angina pectoris ([place]) 08/16/2015  . Coronary artery spasm ([place]) 08/16/2015  . Essential hypertension 08/16/2015  . Hyperlipidemia 08/16/2015  . PAT (paroxysmal atrial tachycardia) ([place]) 08/16/2015    Current Outpatient Medications on File Prior to Visit  Medication Sig Dispense Refill  . alendronate (FOSAMAX) 70 MG tablet Take 70 mg by mouth once a week.  4  . aspirin EC 81 MG tablet Take 81 mg by mouth.    Marland Kitchen atorvastatin (LIPITOR) 10 MG tablet Take 1 tablet (10 mg total) by mouth 3 (three) times a week. 90 tablet 3  . Calcium Carb-Ergocalciferol 500-200 MG-UNIT TABS Take by mouth.    . ciprofloxacin (CIPRO) 500 MG tablet Take 1 tablet (500 mg total) by mouth 2 (two) times daily. 20 tablet 0  . cloNIDine (CATAPRES) 0.1 MG tablet Take 0.1 mg by mouth.    . diazepam (VALIUM) 5 MG tablet   0  . DOCOSAHEXAENOIC ACID PO Take by mouth.    . docusate sodium (COLACE) 100 MG capsule Take 100 mg by mouth 2 (two) times daily.    . metroNIDAZOLE (METROCREAM) 0.75 % cream   2  . Multiple Vitamin (MULTI-VITAMINS) TABS Take by mouth.    . nitroGLYCERIN (NITROSTAT) 0.4  MG SL tablet Place 0.4 mg under the tongue.    Marland Kitchen oxyCODONE-acetaminophen (PERCOCET) 10-325 MG tablet Take 1 tablet by mouth every 8 (eight) hours as needed for pain. 20 tablet 0  . polyethylene glycol powder (GLYCOLAX/MIRALAX) powder MIX 17GMS (1 CAPFUL) IN 8OZ OF LIQUID AND DRINK ONCE DAILY    . promethazine (PHENERGAN) 25 MG tablet Take 25 mg by mouth every 8 (eight) hours as needed for nausea or vomiting (Take 1 tablet by mouth every 8 hours as needed for nausea).    . psyllium (METAMUCIL) 58.6 % packet Take 1 packet by mouth daily.    Marland Kitchen spironolactone-hydrochlorothiazide (ALDACTAZIDE) 25-25 MG per tablet   3  . SUMAtriptan (IMITREX) 25 MG tablet Take 25 mg by mouth.    . verapamil (CALAN-SR) 180 MG CR tablet Take 1 tablet (180 mg total) by mouth 2 (two) times daily. 180 tablet 1  . Vitamins-Lipotropics (LIPOFLAVOVIT) TABS Take by mouth.     Current Facility-Administered Medications on File Prior to Visit  Medication Dose Route Frequency Provider Last Rate Last Dose  . dexamethasone (DECADRON) injection 4 mg  4 mg Intra-articular Once Charod Slawinski, DPM      . triamcinolone acetonide (KENALOG) 10 MG/ML injection 10 mg  10 mg Other Once Landis Martins, DPM        Allergies  Allergen Reactions  . Codeine   . Sulfa Antibiotics   . Tape  Objective: There were no vitals filed for this visit.  General: No acute distress, AAOx3  Right foot: K wire and sutures intact with no gapping or dehiscence at surgical site, there is moderate swelling and decreased redness focal to the right forefoot,  no warmth, no drainage, no other signs of infection noted, Capillary fill time <3 seconds in all digits with K wire intact, gross sensation present via light touch to right foot with some degree of hypersensitivity.  No pain with calf compression.   Assessment and Plan:  Problem List Items Addressed This Visit    None    Visit Diagnoses    Varus deformity of right great toe    -  Primary    Hallux interphalangeus, acquired, right       Hammertoe of right foot       Right foot pain       S/P foot surgery, right           -Patient seen and evaluated -A few sutures removed -Applied dry sterile dressing to surgical site right foot secured with ACE wrap and stockinet  -Advised patient to make sure to keep dressings clean, dry, and intact to right surgical site, allowing [facility] to change dressings as instructed -Patient may resume aspirin and to continue with Cipro until completed -For pain patient may take Tylenol and recommend benadryl for itch  -Advised patient to continue with CAM boot and walker with pressure to right heel only even though today she came to office without her walker -Advised patient to limit activity to necessity; No driving or excessive use of foot and being up on foot because this will contribute to swelling   -Advised patient to ice and elevate -Will plan for xray and finishing suture removal and remove kwires at next office visit. In the meantime, patient to call office if any issues or problems arise.   Landis Martins, DPM

## 2017-10-18 ENCOUNTER — Telehealth: Payer: Self-pay | Admitting: Sports Medicine

## 2017-10-18 NOTE — Telephone Encounter (Signed)
Pt called stated she was having bad bowl movements wanting to know what she needs to do. Pt states she has meds from a different dr but didn't know if it was ok to take them

## 2017-10-19 NOTE — Telephone Encounter (Signed)
Spoke with patient gave Dr Leeanne Rio recommendation to use Imodium.  Patient states that they are loose and she recently finished the antibiotic.  She has started a probiotic and is also eating yogurt she was just concerned because she has had C-diff in the past.  Told her to watch for the signs and odor she is familiar with C-diff and to go to her PCP or ER immediately if she notices them she stated understanding.

## 2017-10-19 NOTE — Telephone Encounter (Signed)
If its the consistency of diarrhea she can take medication like Imodium to help. If it persists should go to PCP or Urgent care Thanks Dr. Cannon Kettle

## 2017-10-29 ENCOUNTER — Encounter: Payer: Self-pay | Admitting: Sports Medicine

## 2017-10-29 ENCOUNTER — Ambulatory Visit (INDEPENDENT_AMBULATORY_CARE_PROVIDER_SITE_OTHER): Payer: Medicare Other | Admitting: Sports Medicine

## 2017-10-29 ENCOUNTER — Ambulatory Visit (INDEPENDENT_AMBULATORY_CARE_PROVIDER_SITE_OTHER): Payer: Medicare Other

## 2017-10-29 ENCOUNTER — Telehealth: Payer: Self-pay | Admitting: *Deleted

## 2017-10-29 DIAGNOSIS — M2011 Hallux valgus (acquired), right foot: Secondary | ICD-10-CM | POA: Diagnosis not present

## 2017-10-29 DIAGNOSIS — M2031 Hallux varus (acquired), right foot: Secondary | ICD-10-CM

## 2017-10-29 DIAGNOSIS — Z9889 Other specified postprocedural states: Secondary | ICD-10-CM

## 2017-10-29 DIAGNOSIS — M2041 Other hammer toe(s) (acquired), right foot: Secondary | ICD-10-CM

## 2017-10-29 NOTE — Telephone Encounter (Signed)
-----   Message from Landis Martins, [place] sent at 10/29/2017 12:32 PM EST ----- Regarding: [facility] Continue home nursing care with Betadine and dry dressing in the right fourth webspace until area has completely healed I advised patient that she cannot get the foot wet and must stay compliant with wearing postop shoe to prevent complication of the surgical wound from healing in between her toes

## 2017-10-29 NOTE — Progress Notes (Signed)
Subjective: Melissa Lozano is a 77 y.o. female patient seen today in office for POV #3 (DOS 09-27-17), S/P right reverse Austin bunionectomy Akin osteotomy removal of hardware 2 through 5 hammertoe repair capsulotomy 2 and 3 with excision of interdigital lesion Fourth webspace. Patient states that she is doing okay, denies calf pain, denies headache, chest pain, shortness of breath, nausea, vomiting, fever, or chills. Patient is assisted by her friend this visit. No other issues noted.   Patient Active Problem List   Diagnosis Date Noted  . C. difficile colitis 06/10/2017  . Migraine 06/10/2017  . Osteoporosis 06/10/2017  . Old MI (myocardial infarction) 06/10/2017  . Angina pectoris ([place]) 08/16/2015  . Coronary artery spasm ([place]) 08/16/2015  . Essential hypertension 08/16/2015  . Hyperlipidemia 08/16/2015  . PAT (paroxysmal atrial tachycardia) ([place]) 08/16/2015    Current Outpatient Medications on File Prior to Visit  Medication Sig Dispense Refill  . alendronate (FOSAMAX) 70 MG tablet Take 70 mg by mouth once a week.  4  . aspirin EC 81 MG tablet Take 81 mg by mouth.    Marland Kitchen atorvastatin (LIPITOR) 10 MG tablet Take 1 tablet (10 mg total) by mouth 3 (three) times a week. 90 tablet 3  . Calcium Carb-Ergocalciferol 500-200 MG-UNIT TABS Take by mouth.    . ciprofloxacin (CIPRO) 500 MG tablet Take 1 tablet (500 mg total) by mouth 2 (two) times daily. 20 tablet 0  . cloNIDine (CATAPRES) 0.1 MG tablet Take 0.1 mg by mouth.    . diazepam (VALIUM) 5 MG tablet   0  . DOCOSAHEXAENOIC ACID PO Take by mouth.    . docusate sodium (COLACE) 100 MG capsule Take 100 mg by mouth 2 (two) times daily.    . metroNIDAZOLE (METROCREAM) 0.75 % cream   2  . Multiple Vitamin (MULTI-VITAMINS) TABS Take by mouth.    . nitroGLYCERIN (NITROSTAT) 0.4 MG SL tablet Place 0.4 mg under the tongue.    Marland Kitchen oxyCODONE-acetaminophen (PERCOCET) 10-325 MG tablet Take 1 tablet by mouth every 8 (eight) hours as needed for pain. 20 tablet  0  . polyethylene glycol powder (GLYCOLAX/MIRALAX) powder MIX 17GMS (1 CAPFUL) IN 8OZ OF LIQUID AND DRINK ONCE DAILY    . promethazine (PHENERGAN) 25 MG tablet Take 25 mg by mouth every 8 (eight) hours as needed for nausea or vomiting (Take 1 tablet by mouth every 8 hours as needed for nausea).    . psyllium (METAMUCIL) 58.6 % packet Take 1 packet by mouth daily.    Marland Kitchen spironolactone-hydrochlorothiazide (ALDACTAZIDE) 25-25 MG per tablet   3  . SUMAtriptan (IMITREX) 25 MG tablet Take 25 mg by mouth.    . verapamil (CALAN-SR) 180 MG CR tablet Take 1 tablet (180 mg total) by mouth 2 (two) times daily. 180 tablet 1  . Vitamins-Lipotropics (LIPOFLAVOVIT) TABS Take by mouth.     Current Facility-Administered Medications on File Prior to Visit  Medication Dose Route Frequency Provider Last Rate Last Dose  . dexamethasone (DECADRON) injection 4 mg  4 mg Intra-articular Once Takayla Baillie, DPM      . triamcinolone acetonide (KENALOG) 10 MG/ML injection 10 mg  10 mg Other Once Landis Martins, DPM        Allergies  Allergen Reactions  . Codeine   . Sulfa Antibiotics   . Tape     Objective: There were no vitals filed for this visit.  General: No acute distress, AAOx3  Right foot: K wire and sutures intact with no gapping or  dehiscence at surgical site, there is maceration at fourth webspace, moderate swelling and decreased redness focal to the right forefoot,  no warmth, no drainage, no other signs of infection noted, Capillary fill time <3 seconds in all digits with K wire intact, gross sensation present via light touch to right foot with some degree of hypersensitivity.  No pain with calf compression.   Assessment and Plan:  Problem List Items Addressed This Visit    None    Visit Diagnoses    S/P foot surgery, right    -  Primary   Relevant Orders   DG Foot Complete Right   Varus deformity of right great toe       Relevant Orders   DG Foot Complete Right   Hallux interphalangeus,  acquired, right       Relevant Orders   DG Foot Complete Right   Hammertoe of right foot       Relevant Orders   DG Foot Complete Right       -Patient seen and evaluated - x-rays reviewed consistent with postop status -Remaining sutures were removed and K wires were removed -Applied Betadine to fourth webspace and Steri-Strips and stockinet  -Advised patient to make sure to keep dressings clean, dry, and intact to right surgical site, allowing [facility] to change dressings as instructed with Betadine at the fourth webspace and to refrain from getting foot wet -Dispense postop shoe to use as instructed -Advised patient to limit activity to necessity -Advised patient to ice and elevate -Will plan for fourth interspace wound check at next office visit and increasing activities. In the meantime, patient to call office if any issues or problems arise.   Landis Martins, DPM

## 2017-10-29 NOTE — Telephone Encounter (Signed)
Faxed copy of Dr. Leeanne Rio 10/29/2017 12:32pm orders to [facility].

## 2017-11-02 ENCOUNTER — Ambulatory Visit
Admission: RE | Admit: 2017-11-02 | Discharge: 2017-11-02 | Disposition: A | Discharge status: Home / Self Care | Payer: Medicare Other | Source: Ambulatory Visit | Attending: Internal Medicine | Admitting: Internal Medicine

## 2017-11-02 DIAGNOSIS — Z139 Encounter for screening, unspecified: Secondary | ICD-10-CM

## 2017-11-05 ENCOUNTER — Ambulatory Visit (INDEPENDENT_AMBULATORY_CARE_PROVIDER_SITE_OTHER): Payer: Medicare Other | Admitting: Sports Medicine

## 2017-11-05 DIAGNOSIS — M2031 Hallux varus (acquired), right foot: Secondary | ICD-10-CM

## 2017-11-05 DIAGNOSIS — M2041 Other hammer toe(s) (acquired), right foot: Secondary | ICD-10-CM

## 2017-11-05 DIAGNOSIS — Z9889 Other specified postprocedural states: Secondary | ICD-10-CM

## 2017-11-05 DIAGNOSIS — M2011 Hallux valgus (acquired), right foot: Secondary | ICD-10-CM

## 2017-11-07 ENCOUNTER — Encounter: Payer: Self-pay | Admitting: Sports Medicine

## 2017-11-07 NOTE — Progress Notes (Signed)
Subjective: Melissa Lozano is a 77 y.o. female patient seen today in office for POV #4 (DOS 09-27-17), S/P right reverse Austin bunionectomy Akin osteotomy removal of hardware 2 through 5 hammertoe repair capsulotomy 2 and 3 with excision of interdigital lesion Fourth webspace. Patient states that she is concerned that in between her toes the area may have opened up again and wanted to have it check, Reports that she has been dressing it with Betadine but it still is bleeding, denies calf pain, denies headache, chest pain, shortness of breath, nausea, vomiting, fever, or chills. No other issues noted.   Patient Active Problem List   Diagnosis Date Noted  . C. difficile colitis 06/10/2017  . Migraine 06/10/2017  . Osteoporosis 06/10/2017  . Old MI (myocardial infarction) 06/10/2017  . Angina pectoris ([place]) 08/16/2015  . Coronary artery spasm ([place]) 08/16/2015  . Essential hypertension 08/16/2015  . Hyperlipidemia 08/16/2015  . PAT (paroxysmal atrial tachycardia) ([place]) 08/16/2015    Current Outpatient Medications on File Prior to Visit  Medication Sig Dispense Refill  . alendronate (FOSAMAX) 70 MG tablet Take 70 mg by mouth once a week.  4  . aspirin EC 81 MG tablet Take 81 mg by mouth.    Marland Kitchen atorvastatin (LIPITOR) 10 MG tablet Take 1 tablet (10 mg total) by mouth 3 (three) times a week. 90 tablet 3  . Calcium Carb-Ergocalciferol 500-200 MG-UNIT TABS Take by mouth.    . ciprofloxacin (CIPRO) 500 MG tablet Take 1 tablet (500 mg total) by mouth 2 (two) times daily. 20 tablet 0  . cloNIDine (CATAPRES) 0.1 MG tablet Take 0.1 mg by mouth.    . diazepam (VALIUM) 5 MG tablet   0  . DOCOSAHEXAENOIC ACID PO Take by mouth.    . docusate sodium (COLACE) 100 MG capsule Take 100 mg by mouth 2 (two) times daily.    . metroNIDAZOLE (METROCREAM) 0.75 % cream   2  . Multiple Vitamin (MULTI-VITAMINS) TABS Take by mouth.    . nitroGLYCERIN (NITROSTAT) 0.4 MG SL tablet Place 0.4 mg under the tongue.    Marland Kitchen  oxyCODONE-acetaminophen (PERCOCET) 10-325 MG tablet Take 1 tablet by mouth every 8 (eight) hours as needed for pain. 20 tablet 0  . polyethylene glycol powder (GLYCOLAX/MIRALAX) powder MIX 17GMS (1 CAPFUL) IN 8OZ OF LIQUID AND DRINK ONCE DAILY    . promethazine (PHENERGAN) 25 MG tablet Take 25 mg by mouth every 8 (eight) hours as needed for nausea or vomiting (Take 1 tablet by mouth every 8 hours as needed for nausea).    . psyllium (METAMUCIL) 58.6 % packet Take 1 packet by mouth daily.    Marland Kitchen spironolactone-hydrochlorothiazide (ALDACTAZIDE) 25-25 MG per tablet   3  . SUMAtriptan (IMITREX) 25 MG tablet Take 25 mg by mouth.    . verapamil (CALAN-SR) 180 MG CR tablet Take 1 tablet (180 mg total) by mouth 2 (two) times daily. 180 tablet 1  . Vitamins-Lipotropics (LIPOFLAVOVIT) TABS Take by mouth.     Current Facility-Administered Medications on File Prior to Visit  Medication Dose Route Frequency Provider Last Rate Last Dose  . dexamethasone (DECADRON) injection 4 mg  4 mg Intra-articular Once Adonna Horsley, DPM      . triamcinolone acetonide (KENALOG) 10 MG/ML injection 10 mg  10 mg Other Once Landis Martins, DPM        Allergies  Allergen Reactions  . Codeine   . Sulfa Antibiotics   . Tape     Objective: There were no  vitals filed for this visit.  General: No acute distress, AAOx3  Right foot: Surgical sites intact with no gapping or dehiscence, there is mild maceration at fourth webspace with hypergranular tissue, moderate swelling and no redness focal to the right forefoot,  no warmth, no other signs of infection noted, Capillary fill time <3 seconds in all digits, gross sensation present via light touch to right foot, There is mild deviation of the 2nd toe likely from extensor over pull.  No pain with calf compression.   Assessment and Plan:  Problem List Items Addressed This Visit    None    Visit Diagnoses    S/P foot surgery, right    -  Primary   Varus deformity of right  great toe       Hallux interphalangeus, acquired, right       Hammertoe of right foot           -Patient seen and evaluated -Applied silver nitrate to 4th webspace hypergranular tissue  -Applied Coban splint to 2-3 toes and advised patient to do the same daily   - Instructed patient with assistance of nursing to continue with Betadine at the fourth webspace and to refrain from getting foot wet  -Continue with postop shoe to use as instructed -Advised patient to limit activity to necessity -Advised patient to ice and elevate -Will plan for follow up fourth interspace wound check at next office visit and increasing activities if healed. In the meantime, patient to call office if any issues or problems arise.   Landis Martins, DPM

## 2017-11-08 ENCOUNTER — Telehealth: Payer: Self-pay | Admitting: Cardiology

## 2017-11-08 ENCOUNTER — Telehealth: Payer: Self-pay | Admitting: Sports Medicine

## 2017-11-08 NOTE — Telephone Encounter (Signed)
Clarified that isosorbide is not on the patient's medication list. Patient verbalized understanding and will discard the expired medication. No further questions.

## 2017-11-08 NOTE — Telephone Encounter (Signed)
Patient needs to speak about medications

## 2017-11-08 NOTE — Telephone Encounter (Signed)
Hey this is [place]. I'm one of the nurses with [facility]. Melissa Lozano is no longer what we consider home bound and we will need to discharge her later on this week. If you have any questions please feel free to call me back at [phone]. Thank you so much. Bye.

## 2017-11-09 NOTE — Telephone Encounter (Signed)
Ok thanks -Dr. S 

## 2017-11-12 ENCOUNTER — Ambulatory Visit (INDEPENDENT_AMBULATORY_CARE_PROVIDER_SITE_OTHER): Payer: Medicare Other | Admitting: Sports Medicine

## 2017-11-12 ENCOUNTER — Encounter: Payer: Self-pay | Admitting: Sports Medicine

## 2017-11-12 DIAGNOSIS — M79671 Pain in right foot: Secondary | ICD-10-CM

## 2017-11-12 DIAGNOSIS — M2041 Other hammer toe(s) (acquired), right foot: Secondary | ICD-10-CM

## 2017-11-12 DIAGNOSIS — M2011 Hallux valgus (acquired), right foot: Secondary | ICD-10-CM

## 2017-11-12 DIAGNOSIS — Z9889 Other specified postprocedural states: Secondary | ICD-10-CM

## 2017-11-12 DIAGNOSIS — M2031 Hallux varus (acquired), right foot: Secondary | ICD-10-CM

## 2017-11-12 NOTE — Progress Notes (Signed)
Subjective: Melissa Lozano is a 77 y.o. female patient seen today in office for POV #5 (DOS 09-27-17), S/P right reverse Austin bunionectomy Akin osteotomy removal of hardware 2 through 5 hammertoe repair capsulotomy 2 and 3 with excision of interdigital lesion Fourth webspace. Patient states that the silver nitrate in between the toes really help to dry out the area and she feels like it is doing better and scabbing over, Reports that she has been dressing it with Betadine as instructed however as far as splinting the toes patient has not been consistent with this, denies calf pain, denies headache, chest pain, shortness of breath, nausea, vomiting, fever, or chills. No other issues noted.   Patient Active Problem List   Diagnosis Date Noted  . C. difficile colitis 06/10/2017  . Migraine 06/10/2017  . Osteoporosis 06/10/2017  . Old MI (myocardial infarction) 06/10/2017  . Angina pectoris ([place]) 08/16/2015  . Coronary artery spasm ([place]) 08/16/2015  . Essential hypertension 08/16/2015  . Hyperlipidemia 08/16/2015  . PAT (paroxysmal atrial tachycardia) ([place]) 08/16/2015    Current Outpatient Medications on File Prior to Visit  Medication Sig Dispense Refill  . alendronate (FOSAMAX) 70 MG tablet Take 70 mg by mouth once a week.  4  . aspirin EC 81 MG tablet Take 81 mg by mouth.    Marland Kitchen atorvastatin (LIPITOR) 10 MG tablet Take 1 tablet (10 mg total) by mouth 3 (three) times a week. 90 tablet 3  . Calcium Carb-Ergocalciferol 500-200 MG-UNIT TABS Take by mouth.    . ciprofloxacin (CIPRO) 500 MG tablet Take 1 tablet (500 mg total) by mouth 2 (two) times daily. 20 tablet 0  . cloNIDine (CATAPRES) 0.1 MG tablet Take 0.1 mg by mouth.    . diazepam (VALIUM) 5 MG tablet   0  . DOCOSAHEXAENOIC ACID PO Take by mouth.    . docusate sodium (COLACE) 100 MG capsule Take 100 mg by mouth 2 (two) times daily.    . metroNIDAZOLE (METROCREAM) 0.75 % cream   2  . Multiple Vitamin (MULTI-VITAMINS) TABS Take by mouth.     . nitroGLYCERIN (NITROSTAT) 0.4 MG SL tablet Place 0.4 mg under the tongue.    Marland Kitchen oxyCODONE-acetaminophen (PERCOCET) 10-325 MG tablet Take 1 tablet by mouth every 8 (eight) hours as needed for pain. 20 tablet 0  . polyethylene glycol powder (GLYCOLAX/MIRALAX) powder MIX 17GMS (1 CAPFUL) IN 8OZ OF LIQUID AND DRINK ONCE DAILY    . promethazine (PHENERGAN) 25 MG tablet Take 25 mg by mouth every 8 (eight) hours as needed for nausea or vomiting (Take 1 tablet by mouth every 8 hours as needed for nausea).    . psyllium (METAMUCIL) 58.6 % packet Take 1 packet by mouth daily.    Marland Kitchen spironolactone-hydrochlorothiazide (ALDACTAZIDE) 25-25 MG per tablet   3  . SUMAtriptan (IMITREX) 25 MG tablet Take 25 mg by mouth.    . verapamil (CALAN-SR) 180 MG CR tablet Take 1 tablet (180 mg total) by mouth 2 (two) times daily. 180 tablet 1  . Vitamins-Lipotropics (LIPOFLAVOVIT) TABS Take by mouth.     Current Facility-Administered Medications on File Prior to Visit  Medication Dose Route Frequency Provider Last Rate Last Dose  . dexamethasone (DECADRON) injection 4 mg  4 mg Intra-articular Once Candance Bohlman, DPM      . triamcinolone acetonide (KENALOG) 10 MG/ML injection 10 mg  10 mg Other Once Landis Martins, DPM        Allergies  Allergen Reactions  . Codeine   .  Sulfa Antibiotics   . Tape     Objective: There were no vitals filed for this visit.  General: No acute distress, AAOx3  Right foot: Surgical sites intact with no gapping or dehiscence, there is mild maceration at fourth webspace with hypergranular tissue at the dorsal aspect of the interspace, continue moderate swelling and no redness focal to the right forefoot,  no warmth, no other signs of infection noted, Capillary fill time <3 seconds in all digits, gross sensation present via light touch to right foot, There is mild deviation of the 2nd toe likely from extensor over pull as previously noted.  No pain with calf compression.   Assessment and  Plan:  Problem List Items Addressed This Visit    None    Visit Diagnoses    S/P foot surgery, right    -  Primary   Varus deformity of right great toe       Hallux interphalangeus, acquired, right       Hammertoe of right foot       Right foot pain           -Patient seen and evaluated -Applied silver nitrate to 4th webspace hypergranular tissue  -Applied again Coban splint to 2-3 toes and advised patient to do the same daily   -Continue with Betadine to fourth interspace and keeping the foot clean and dry do not get foot wet with showers until after evaluated next visit -Continue with postop shoe to use as instructed -Advised patient to limit activity to necessity -Advised patient to ice and elevate -Will plan for follow up fourth interspace wound check at next office visit and discuss possible tenotomy at second toe if continues to deviate. In the meantime, patient to call office if any issues or problems arise.   Landis Martins, DPM

## 2017-11-26 ENCOUNTER — Encounter: Payer: Self-pay | Admitting: Sports Medicine

## 2017-11-26 ENCOUNTER — Ambulatory Visit (INDEPENDENT_AMBULATORY_CARE_PROVIDER_SITE_OTHER): Payer: Medicare Other | Admitting: Sports Medicine

## 2017-11-26 DIAGNOSIS — Z9889 Other specified postprocedural states: Secondary | ICD-10-CM

## 2017-11-26 DIAGNOSIS — M2041 Other hammer toe(s) (acquired), right foot: Secondary | ICD-10-CM

## 2017-11-26 DIAGNOSIS — T8189XA Other complications of procedures, not elsewhere classified, initial encounter: Secondary | ICD-10-CM

## 2017-11-26 DIAGNOSIS — M2031 Hallux varus (acquired), right foot: Secondary | ICD-10-CM

## 2017-11-26 DIAGNOSIS — M79671 Pain in right foot: Secondary | ICD-10-CM

## 2017-11-26 DIAGNOSIS — M2011 Hallux valgus (acquired), right foot: Secondary | ICD-10-CM

## 2017-11-26 NOTE — Progress Notes (Signed)
Subjective: Melissa Lozano is a 77 y.o. female patient seen today in office for POV #6 (DOS 09-27-17), S/P right reverse Austin bunionectomy Akin osteotomy removal of hardware 2 through 5 hammertoe repair capsulotomy 2 and 3 with excision of interdigital lesion Fourth webspace. Patient states that scab is still present.  Reports that she has been dressing it with Betadine as instructed and has been more compliant with splinting the toes, denies calf pain, denies headache, chest pain, shortness of breath, nausea, vomiting, fever, or chills. No other issues noted.   Patient Active Problem List   Diagnosis Date Noted  . C. difficile colitis 06/10/2017  . Migraine 06/10/2017  . Osteoporosis 06/10/2017  . Old MI (myocardial infarction) 06/10/2017  . Angina pectoris ([place]) 08/16/2015  . Coronary artery spasm ([place]) 08/16/2015  . Essential hypertension 08/16/2015  . Hyperlipidemia 08/16/2015  . PAT (paroxysmal atrial tachycardia) ([place]) 08/16/2015    Current Outpatient Medications on File Prior to Visit  Medication Sig Dispense Refill  . alendronate (FOSAMAX) 70 MG tablet Take 70 mg by mouth once a week.  4  . aspirin EC 81 MG tablet Take 81 mg by mouth.    Marland Kitchen atorvastatin (LIPITOR) 10 MG tablet Take 1 tablet (10 mg total) by mouth 3 (three) times a week. 90 tablet 3  . Calcium Carb-Ergocalciferol 500-200 MG-UNIT TABS Take by mouth.    . ciprofloxacin (CIPRO) 500 MG tablet Take 1 tablet (500 mg total) by mouth 2 (two) times daily. 20 tablet 0  . cloNIDine (CATAPRES) 0.1 MG tablet Take 0.1 mg by mouth.    . diazepam (VALIUM) 5 MG tablet   0  . DOCOSAHEXAENOIC ACID PO Take by mouth.    . docusate sodium (COLACE) 100 MG capsule Take 100 mg by mouth 2 (two) times daily.    . metroNIDAZOLE (METROCREAM) 0.75 % cream   2  . Multiple Vitamin (MULTI-VITAMINS) TABS Take by mouth.    . nitroGLYCERIN (NITROSTAT) 0.4 MG SL tablet Place 0.4 mg under the tongue.    Marland Kitchen oxyCODONE-acetaminophen (PERCOCET) 10-325 MG  tablet Take 1 tablet by mouth every 8 (eight) hours as needed for pain. 20 tablet 0  . polyethylene glycol powder (GLYCOLAX/MIRALAX) powder MIX 17GMS (1 CAPFUL) IN 8OZ OF LIQUID AND DRINK ONCE DAILY    . promethazine (PHENERGAN) 25 MG tablet Take 25 mg by mouth every 8 (eight) hours as needed for nausea or vomiting (Take 1 tablet by mouth every 8 hours as needed for nausea).    . psyllium (METAMUCIL) 58.6 % packet Take 1 packet by mouth daily.    Marland Kitchen spironolactone-hydrochlorothiazide (ALDACTAZIDE) 25-25 MG per tablet   3  . SUMAtriptan (IMITREX) 25 MG tablet Take 25 mg by mouth.    . verapamil (CALAN-SR) 180 MG CR tablet Take 1 tablet (180 mg total) by mouth 2 (two) times daily. 180 tablet 1  . Vitamins-Lipotropics (LIPOFLAVOVIT) TABS Take by mouth.     Current Facility-Administered Medications on File Prior to Visit  Medication Dose Route Frequency Provider Last Rate Last Dose  . dexamethasone (DECADRON) injection 4 mg  4 mg Intra-articular Once Kyland No, DPM      . triamcinolone acetonide (KENALOG) 10 MG/ML injection 10 mg  10 mg Other Once Landis Martins, DPM        Allergies  Allergen Reactions  . Codeine   . Sulfa Antibiotics   . Tape     Objective: There were no vitals filed for this visit.  General: No acute distress,  AAOx3  Right foot: Surgical sites intact with no gapping or dehiscence, there is hypergranular tissue at the dorsal aspect of the interspace once thoroughly evaluated there was a small retained suture at this area, no redness focal to the right forefoot,  no warmth, no other signs of infection noted, Capillary fill time <3 seconds in all digits, gross sensation present via light touch to right foot, There is mild deviation of the 2nd toe likely from extensor over pull as previously noted.  No pain with calf compression.   Assessment and Plan:  Problem List Items Addressed This Visit    None    Visit Diagnoses    Suture granuloma, initial encounter    -   Primary   S/P foot surgery, right       Varus deformity of right great toe       Hallux interphalangeus, acquired, right       Hammertoe of right foot       Right foot pain           -Patient seen and evaluated -Suture retained was removed from fourth interspace on right foot -Applied silver nitrate to 4th webspace hypergranular tissue  -Applied again Coban splint to 2-3 toes and advised patient to do the same daily   -Advised patient she may now shower as normal and no longer needs to apply Betadine to the right fourth webspace -Patient may slowly start to wear normal shoe -Advised patient to limit activity to necessity -Advised patient to ice and elevate -Will plan for follow up fourth interspace wound check at next office visit and discuss possible tenotomy at second toe if continues to deviate. In the meantime, patient to call office if any issues or problems arise.  -Advised patient for her concerns for possible fungal nails to consider in the future for further evaluation once her right foot has healed.  Landis Martins, DPM

## 2017-12-15 ENCOUNTER — Encounter: Payer: Self-pay | Admitting: Sports Medicine

## 2017-12-15 ENCOUNTER — Ambulatory Visit (INDEPENDENT_AMBULATORY_CARE_PROVIDER_SITE_OTHER): Payer: Medicare Other | Admitting: Sports Medicine

## 2017-12-15 DIAGNOSIS — T8189XD Other complications of procedures, not elsewhere classified, subsequent encounter: Secondary | ICD-10-CM

## 2017-12-15 DIAGNOSIS — M2031 Hallux varus (acquired), right foot: Secondary | ICD-10-CM

## 2017-12-15 DIAGNOSIS — M2041 Other hammer toe(s) (acquired), right foot: Secondary | ICD-10-CM

## 2017-12-15 DIAGNOSIS — M2011 Hallux valgus (acquired), right foot: Secondary | ICD-10-CM

## 2017-12-15 DIAGNOSIS — Z9889 Other specified postprocedural states: Secondary | ICD-10-CM

## 2017-12-15 DIAGNOSIS — M79671 Pain in right foot: Secondary | ICD-10-CM

## 2017-12-15 NOTE — Progress Notes (Signed)
Subjective: Melissa Lozano is a 77 y.o. female patient seen today in office for POV #7 (DOS 09-27-17), S/P right reverse Austin bunionectomy Akin osteotomy removal of hardware 2 through 5 hammertoe repair capsulotomy 2 and 3 with excision of interdigital lesion Fourth webspace. Patient states that pain is better, still not able to wear a closed in shoe; feels best in sandals and is concerned with how 2-3 toes are turning inward.  Reports that she has been splinting the toes during day, denies calf pain, denies headache, chest pain, shortness of breath, nausea, vomiting, fever, or chills. No other issues noted.   Patient Active Problem List   Diagnosis Date Noted  . C. difficile colitis 06/10/2017  . Migraine 06/10/2017  . Osteoporosis 06/10/2017  . Old MI (myocardial infarction) 06/10/2017  . Angina pectoris ([place]) 08/16/2015  . Coronary artery spasm ([place]) 08/16/2015  . Essential hypertension 08/16/2015  . Hyperlipidemia 08/16/2015  . PAT (paroxysmal atrial tachycardia) ([place]) 08/16/2015    Current Outpatient Medications on File Prior to Visit  Medication Sig Dispense Refill  . alendronate (FOSAMAX) 70 MG tablet Take 70 mg by mouth once a week.  4  . aspirin EC 81 MG tablet Take 81 mg by mouth.    Marland Kitchen atorvastatin (LIPITOR) 10 MG tablet Take 1 tablet (10 mg total) by mouth 3 (three) times a week. 90 tablet 3  . Calcium Carb-Ergocalciferol 500-200 MG-UNIT TABS Take by mouth.    . ciprofloxacin (CIPRO) 500 MG tablet Take 1 tablet (500 mg total) by mouth 2 (two) times daily. 20 tablet 0  . cloNIDine (CATAPRES) 0.1 MG tablet Take 0.1 mg by mouth.    . diazepam (VALIUM) 5 MG tablet   0  . DOCOSAHEXAENOIC ACID PO Take by mouth.    . docusate sodium (COLACE) 100 MG capsule Take 100 mg by mouth 2 (two) times daily.    . metroNIDAZOLE (METROCREAM) 0.75 % cream   2  . Multiple Vitamin (MULTI-VITAMINS) TABS Take by mouth.    . nitroGLYCERIN (NITROSTAT) 0.4 MG SL tablet Place 0.4 mg under the tongue.    Marland Kitchen  oxyCODONE-acetaminophen (PERCOCET) 10-325 MG tablet Take 1 tablet by mouth every 8 (eight) hours as needed for pain. 20 tablet 0  . polyethylene glycol powder (GLYCOLAX/MIRALAX) powder MIX 17GMS (1 CAPFUL) IN 8OZ OF LIQUID AND DRINK ONCE DAILY    . promethazine (PHENERGAN) 25 MG tablet Take 25 mg by mouth every 8 (eight) hours as needed for nausea or vomiting (Take 1 tablet by mouth every 8 hours as needed for nausea).    . psyllium (METAMUCIL) 58.6 % packet Take 1 packet by mouth daily.    Marland Kitchen spironolactone-hydrochlorothiazide (ALDACTAZIDE) 25-25 MG per tablet   3  . SUMAtriptan (IMITREX) 25 MG tablet Take 25 mg by mouth.    . verapamil (CALAN-SR) 180 MG CR tablet Take 1 tablet (180 mg total) by mouth 2 (two) times daily. 180 tablet 1  . Vitamins-Lipotropics (LIPOFLAVOVIT) TABS Take by mouth.     Current Facility-Administered Medications on File Prior to Visit  Medication Dose Route Frequency Provider Last Rate Last Dose  . dexamethasone (DECADRON) injection 4 mg  4 mg Intra-articular Once Teaghan Formica, DPM      . triamcinolone acetonide (KENALOG) 10 MG/ML injection 10 mg  10 mg Other Once Landis Martins, DPM        Allergies  Allergen Reactions  . Codeine   . Sulfa Antibiotics   . Tape     Objective: There  were no vitals filed for this visit.  General: No acute distress, AAOx3  Right foot: Surgical sites intact with no gapping or dehiscence, there is a small retained vicryl suture at distal 1st ray incision line, no redness focal to the right forefoot,  no warmth, no other signs of infection noted, Capillary fill time <3 seconds in all digits, gross sensation present via light touch to right foot, There is mild deviation of the 2nd toe and now 3rd toe likely from extensor over pull and compensation in gait.  No pain with calf compression.   Assessment and Plan:  Problem List Items Addressed This Visit    None    Visit Diagnoses    Suture granuloma, subsequent encounter    -   Primary   Resolved   S/P foot surgery, right       Varus deformity of right great toe       Hallux interphalangeus, acquired, right       Hammertoe of right foot       Right foot pain          -Patient seen and evaluated -Suture retained was removed from 1st ray -Recommend at bedtime Coban splint to 2-3 toes and advised patient to do the same -Advised patient may wear a normal shoe until the time for an office tenotomy procedure which we will schedule next available to address deviation of second and third toes on right foot. -Advised patient for her concerns for possible fungal nails to consider time for nails to grow out, nail changes may be response to postoperative status and anesthesia.   Landis Martins, DPM

## 2017-12-31 ENCOUNTER — Ambulatory Visit: Payer: Medicare Other | Admitting: Sports Medicine

## 2018-01-13 ENCOUNTER — Ambulatory Visit: Payer: Medicare Other | Admitting: Sports Medicine

## 2018-01-13 ENCOUNTER — Encounter: Payer: Self-pay | Admitting: Sports Medicine

## 2018-01-13 DIAGNOSIS — Z9889 Other specified postprocedural states: Secondary | ICD-10-CM | POA: Diagnosis not present

## 2018-01-13 DIAGNOSIS — M2011 Hallux valgus (acquired), right foot: Secondary | ICD-10-CM

## 2018-01-13 DIAGNOSIS — M2031 Hallux varus (acquired), right foot: Secondary | ICD-10-CM | POA: Diagnosis not present

## 2018-01-13 DIAGNOSIS — M79671 Pain in right foot: Secondary | ICD-10-CM | POA: Diagnosis not present

## 2018-01-13 DIAGNOSIS — M2041 Other hammer toe(s) (acquired), right foot: Secondary | ICD-10-CM | POA: Diagnosis not present

## 2018-01-13 DIAGNOSIS — M6240 Contracture of muscle, unspecified site: Secondary | ICD-10-CM

## 2018-01-13 NOTE — Progress Notes (Signed)
Subjective: Melissa Lozano is a 77 y.o. female patient seen today in office for POV #8 (DOS 09-27-17), S/P right reverse Austin bunionectomy Akin osteotomy removal of hardware 2 through 5 hammertoe repair capsulotomy 2 and 3 with excision of interdigital lesion Fourth webspace. Patient states that pain is better, still concerned with how 2-3 toes are turning inward.  Reports that she stopped splinting the toes because he felt like it was making it worse, reports that the swelling is much improved, denies calf pain, denies headache, chest pain, shortness of breath, nausea, vomiting, fever, or chills. No other issues noted.   Patient Active Problem List   Diagnosis Date Noted  . C. difficile colitis 06/10/2017  . Migraine 06/10/2017  . Osteoporosis 06/10/2017  . Old MI (myocardial infarction) 06/10/2017  . Angina pectoris ([place]) 08/16/2015  . Coronary artery spasm ([place]) 08/16/2015  . Essential hypertension 08/16/2015  . Hyperlipidemia 08/16/2015  . PAT (paroxysmal atrial tachycardia) ([place]) 08/16/2015    Current Outpatient Medications on File Prior to Visit  Medication Sig Dispense Refill  . alendronate (FOSAMAX) 70 MG tablet Take 70 mg by mouth once a week.  4  . aspirin EC 81 MG tablet Take 81 mg by mouth.    Marland Kitchen atorvastatin (LIPITOR) 10 MG tablet Take 1 tablet (10 mg total) by mouth 3 (three) times a week. 90 tablet 3  . Calcium Carb-Ergocalciferol 500-200 MG-UNIT TABS Take by mouth.    . ciprofloxacin (CIPRO) 500 MG tablet Take 1 tablet (500 mg total) by mouth 2 (two) times daily. 20 tablet 0  . cloNIDine (CATAPRES) 0.1 MG tablet Take 0.1 mg by mouth.    . diazepam (VALIUM) 5 MG tablet   0  . DOCOSAHEXAENOIC ACID PO Take by mouth.    . docusate sodium (COLACE) 100 MG capsule Take 100 mg by mouth 2 (two) times daily.    . metroNIDAZOLE (METROCREAM) 0.75 % cream   2  . Multiple Vitamin (MULTI-VITAMINS) TABS Take by mouth.    . nitroGLYCERIN (NITROSTAT) 0.4 MG SL tablet Place 0.4 mg under the  tongue.    Marland Kitchen oxyCODONE-acetaminophen (PERCOCET) 10-325 MG tablet Take 1 tablet by mouth every 8 (eight) hours as needed for pain. 20 tablet 0  . polyethylene glycol powder (GLYCOLAX/MIRALAX) powder MIX 17GMS (1 CAPFUL) IN 8OZ OF LIQUID AND DRINK ONCE DAILY    . promethazine (PHENERGAN) 25 MG tablet Take 25 mg by mouth every 8 (eight) hours as needed for nausea or vomiting (Take 1 tablet by mouth every 8 hours as needed for nausea).    . psyllium (METAMUCIL) 58.6 % packet Take 1 packet by mouth daily.    Marland Kitchen spironolactone-hydrochlorothiazide (ALDACTAZIDE) 25-25 MG per tablet   3  . SUMAtriptan (IMITREX) 25 MG tablet Take 25 mg by mouth.    . verapamil (CALAN-SR) 180 MG CR tablet Take 1 tablet (180 mg total) by mouth 2 (two) times daily. 180 tablet 1  . Vitamins-Lipotropics (LIPOFLAVOVIT) TABS Take by mouth.     Current Facility-Administered Medications on File Prior to Visit  Medication Dose Route Frequency Provider Last Rate Last Dose  . dexamethasone (DECADRON) injection 4 mg  4 mg Intra-articular Once Tailer Volkert, DPM      . triamcinolone acetonide (KENALOG) 10 MG/ML injection 10 mg  10 mg Other Once Landis Martins, DPM        Allergies  Allergen Reactions  . Codeine   . Sulfa Antibiotics   . Tape     Objective: There were  no vitals filed for this visit.  General: No acute distress, AAOx3  Right foot: Surgical sites well-healed, with mild scar contracture, no warmth, no other signs of infection noted, Capillary fill time <3 seconds in all digits, gross sensation present via light touch to right foot, There is mild deviation of the 2nd toe and  3rd toe likely from extensor over pull and compensation in gait.  No pain with calf compression.   Assessment and Plan:  Problem List Items Addressed This Visit    None    Visit Diagnoses    Contracture of tendon    -  Primary   S/P foot surgery, right       Varus deformity of right great toe       Hallux interphalangeus, acquired,  right       Hammertoe of right foot       Right foot pain          -Patient seen and evaluated -Recommend [place] scar cream to incision site -Advised patient may wear a normal shoe until the time for an office tenotomy procedure which we will schedule next available to address deviation of second and third toes on right foot. -Advised patient that after the tenotomy procedure she will have to resume splinting of the toes.  Patient expressed understanding. -Return to office for tenotomy procedure and advised patient that she will have to be a postop shoe initially after surgery  Landis Martins, DPM

## 2018-01-13 NOTE — Patient Instructions (Addendum)
Merderma scar cream use daily to toes

## 2018-02-04 ENCOUNTER — Ambulatory Visit: Payer: Medicare Other | Admitting: Sports Medicine

## 2018-02-24 ENCOUNTER — Telehealth: Payer: Self-pay

## 2018-02-24 MED ORDER — VERAPAMIL HCL ER 180 MG PO TBCR: 180.0000 mg | EXTENDED_RELEASE_TABLET | Freq: Two times a day (BID) | ORAL | 3 refills | Status: DC

## 2018-02-24 NOTE — Telephone Encounter (Signed)
Rx for verapamil 180 mg one tablet twice daily #180 sent to [facility] as requested.

## 2018-02-25 ENCOUNTER — Encounter: Payer: Self-pay | Admitting: Sports Medicine

## 2018-02-25 ENCOUNTER — Ambulatory Visit: Payer: Medicare Other | Admitting: Sports Medicine

## 2018-02-25 DIAGNOSIS — M79671 Pain in right foot: Secondary | ICD-10-CM

## 2018-02-25 DIAGNOSIS — M6240 Contracture of muscle, unspecified site: Secondary | ICD-10-CM

## 2018-02-25 DIAGNOSIS — Z9889 Other specified postprocedural states: Secondary | ICD-10-CM

## 2018-02-25 NOTE — Progress Notes (Signed)
Subjective: Melissa Lozano is a 77 y.o. female patient seen today in office for evaluation of tendon contractures on the right foot.  Patient is also S/P right reverse Austin bunionectomy Akin osteotomy removal of hardware 2 through 5 hammertoe repair capsulotomy 2 and 3 with excision of interdigital lesion Fourth webspace. Patient states that she has no pain and has been taking Tylenol if needed.  Denies calf pain, denies headache, chest pain, shortness of breath, nausea, vomiting, fever, or chills. No other issues noted.   Patient Active Problem List   Diagnosis Date Noted  . C. difficile colitis 06/10/2017  . Migraine 06/10/2017  . Osteoporosis 06/10/2017  . Old MI (myocardial infarction) 06/10/2017  . Angina pectoris ([place]) 08/16/2015  . Coronary artery spasm ([place]) 08/16/2015  . Essential hypertension 08/16/2015  . Hyperlipidemia 08/16/2015  . PAT (paroxysmal atrial tachycardia) ([place]) 08/16/2015    Current Outpatient Medications on File Prior to Visit  Medication Sig Dispense Refill  . alendronate (FOSAMAX) 70 MG tablet Take 70 mg by mouth once a week.  4  . aspirin EC 81 MG tablet Take 81 mg by mouth.    Marland Kitchen atorvastatin (LIPITOR) 10 MG tablet Take 1 tablet (10 mg total) by mouth 3 (three) times a week. 90 tablet 3  . Calcium Carb-Ergocalciferol 500-200 MG-UNIT TABS Take by mouth.    . ciprofloxacin (CIPRO) 500 MG tablet Take 1 tablet (500 mg total) by mouth 2 (two) times daily. 20 tablet 0  . cloNIDine (CATAPRES) 0.1 MG tablet Take 0.1 mg by mouth.    . diazepam (VALIUM) 5 MG tablet   0  . DOCOSAHEXAENOIC ACID PO Take by mouth.    . docusate sodium (COLACE) 100 MG capsule Take 100 mg by mouth 2 (two) times daily.    . metroNIDAZOLE (METROCREAM) 0.75 % cream   2  . Multiple Vitamin (MULTI-VITAMINS) TABS Take by mouth.    . nitroGLYCERIN (NITROSTAT) 0.4 MG SL tablet Place 0.4 mg under the tongue.    Marland Kitchen oxyCODONE-acetaminophen (PERCOCET) 10-325 MG tablet Take 1 tablet by mouth every 8  (eight) hours as needed for pain. 20 tablet 0  . polyethylene glycol powder (GLYCOLAX/MIRALAX) powder MIX 17GMS (1 CAPFUL) IN 8OZ OF LIQUID AND DRINK ONCE DAILY    . promethazine (PHENERGAN) 25 MG tablet Take 25 mg by mouth every 8 (eight) hours as needed for nausea or vomiting (Take 1 tablet by mouth every 8 hours as needed for nausea).    . psyllium (METAMUCIL) 58.6 % packet Take 1 packet by mouth daily.    Marland Kitchen spironolactone-hydrochlorothiazide (ALDACTAZIDE) 25-25 MG per tablet   3  . SUMAtriptan (IMITREX) 25 MG tablet Take 25 mg by mouth.    . verapamil (CALAN-SR) 180 MG CR tablet Take 1 tablet (180 mg total) by mouth 2 (two) times daily. 180 tablet 3  . Vitamins-Lipotropics (LIPOFLAVOVIT) TABS Take by mouth.     Current Facility-Administered Medications on File Prior to Visit  Medication Dose Route Frequency Provider Last Rate Last Dose  . dexamethasone (DECADRON) injection 4 mg  4 mg Intra-articular Once Camdan Burdi, DPM      . triamcinolone acetonide (KENALOG) 10 MG/ML injection 10 mg  10 mg Other Once Landis Martins, DPM        Allergies  Allergen Reactions  . Codeine   . Sulfa Antibiotics   . Tape     Objective: There were no vitals filed for this visit.  General: No acute distress, AAOx3  Right foot: Previous  surgical sites well-healed, with mild scar contracture at the right second and third toes with extensor overpull, no warmth, no other signs of infection noted, Capillary fill time <3 seconds in all digits, gross sensation present via light touch to right foot, There is mild deviation of the 2nd toe and 3rd toe likely from extensor over pull and compensation in gait.  No pain with calf compression.   Assessment and Plan:  Problem List Items Addressed This Visit    None    Visit Diagnoses    Contracture of tendon    -  Primary   S/P foot surgery, right       Right foot pain          -Patient seen and evaluated -Previous x-rays reviewed -Discussed with patient  in office tenotomy procedure and written and oral consent was obtained for extensor tendon release of the right second and third toes -After aseptic prep the area was anesthetized utilizing 6 cc of one-to-one mixture of 1% lidocaine plain and 0.5% Marcaine plain and a local field block fashion at the bases of the second and third toes on the right foot.  After adequate anesthesia was confirmed, a 61 blade was used to make a small stab incision at the base of the second toe at the level of the metatarsal phalangeal joint and the toe was plantarflexed and the extensor tendon was then released with noted improved position of the toe with less deviation.  The same procedure was done in a sequential fashion for the extensor tendon to the third toe of the right foot as well.  After completion of the procedure hemostasis was achieved  using manual pressure and then the area was dressed and splinted with Steri-Strips dry sterile gauze dressing, [place], and [place].  Patient tolerated the procedure and local anesthetic well without complication.  Advised patient to monitor closely for any signs of infection and if noted to call office for antibiotic or for sooner appointment.  Advised patient to keep dressings clean dry and intact until next visit and to use postop shoe.  Patient instructed to rest ice elevate and to take Tylenol if needed for pain or fever. -Return to office in 1 week for follow-up evaluation.  At this time patient will likely be able to return to a normal shoe and will have to continue with splinting of toes at bedtime.  Landis Martins, DPM

## 2018-03-04 ENCOUNTER — Ambulatory Visit (INDEPENDENT_AMBULATORY_CARE_PROVIDER_SITE_OTHER): Payer: Medicare Other | Admitting: Sports Medicine

## 2018-03-04 ENCOUNTER — Encounter: Payer: Self-pay | Admitting: Sports Medicine

## 2018-03-04 DIAGNOSIS — M79671 Pain in right foot: Secondary | ICD-10-CM

## 2018-03-04 DIAGNOSIS — M6240 Contracture of muscle, unspecified site: Secondary | ICD-10-CM

## 2018-03-04 DIAGNOSIS — Z9889 Other specified postprocedural states: Secondary | ICD-10-CM

## 2018-03-04 NOTE — Progress Notes (Signed)
Subjective: Melissa Lozano is a 77 y.o. female patient seen today in office for POV #1 (DOS 02-25-18), S/P extensor tenotomy of right second and third toes due to tendon contracture and scar tissue that was performed in office. Patient denies pain at surgical site, denies calf pain, denies headache, chest pain, shortness of breath, nausea, vomiting, fever, or chills. Patient states that she is doing well and return to using her toe splint because her dressing came off about 3 days after we did the surgery. No other issues noted.   Patient Active Problem List   Diagnosis Date Noted  . C. difficile colitis 06/10/2017  . Migraine 06/10/2017  . Osteoporosis 06/10/2017  . Old MI (myocardial infarction) 06/10/2017  . Angina pectoris ([place]) 08/16/2015  . Coronary artery spasm ([place]) 08/16/2015  . Essential hypertension 08/16/2015  . Hyperlipidemia 08/16/2015  . PAT (paroxysmal atrial tachycardia) ([place]) 08/16/2015    Current Outpatient Medications on File Prior to Visit  Medication Sig Dispense Refill  . alendronate (FOSAMAX) 70 MG tablet Take 70 mg by mouth once a week.  4  . aspirin EC 81 MG tablet Take 81 mg by mouth.    Marland Kitchen atorvastatin (LIPITOR) 10 MG tablet Take 1 tablet (10 mg total) by mouth 3 (three) times a week. 90 tablet 3  . Calcium Carb-Ergocalciferol 500-200 MG-UNIT TABS Take by mouth.    . ciprofloxacin (CIPRO) 500 MG tablet Take 1 tablet (500 mg total) by mouth 2 (two) times daily. 20 tablet 0  . cloNIDine (CATAPRES) 0.1 MG tablet Take 0.1 mg by mouth.    . diazepam (VALIUM) 5 MG tablet   0  . DOCOSAHEXAENOIC ACID PO Take by mouth.    . docusate sodium (COLACE) 100 MG capsule Take 100 mg by mouth 2 (two) times daily.    . metroNIDAZOLE (METROCREAM) 0.75 % cream   2  . Multiple Vitamin (MULTI-VITAMINS) TABS Take by mouth.    . nitroGLYCERIN (NITROSTAT) 0.4 MG SL tablet Place 0.4 mg under the tongue.    Marland Kitchen oxyCODONE-acetaminophen (PERCOCET) 10-325 MG tablet Take 1 tablet by mouth every 8  (eight) hours as needed for pain. 20 tablet 0  . polyethylene glycol powder (GLYCOLAX/MIRALAX) powder MIX 17GMS (1 CAPFUL) IN 8OZ OF LIQUID AND DRINK ONCE DAILY    . promethazine (PHENERGAN) 25 MG tablet Take 25 mg by mouth every 8 (eight) hours as needed for nausea or vomiting (Take 1 tablet by mouth every 8 hours as needed for nausea).    . psyllium (METAMUCIL) 58.6 % packet Take 1 packet by mouth daily.    Marland Kitchen spironolactone-hydrochlorothiazide (ALDACTAZIDE) 25-25 MG per tablet   3  . SUMAtriptan (IMITREX) 25 MG tablet Take 25 mg by mouth.    . verapamil (CALAN-SR) 180 MG CR tablet Take 1 tablet (180 mg total) by mouth 2 (two) times daily. 180 tablet 3  . Vitamins-Lipotropics (LIPOFLAVOVIT) TABS Take by mouth.     Current Facility-Administered Medications on File Prior to Visit  Medication Dose Route Frequency Provider Last Rate Last Dose  . dexamethasone (DECADRON) injection 4 mg  4 mg Intra-articular Once Zakry Caso, DPM      . triamcinolone acetonide (KENALOG) 10 MG/ML injection 10 mg  10 mg Other Once Landis Martins, DPM        Allergies  Allergen Reactions  . Codeine   . Sulfa Antibiotics   . Tape     Objective: There were no vitals filed for this visit.  General: No acute distress,  AAOx3  Right foot: Stab incisions well-healed with no gapping or dehiscence at surgical sites, minimal swelling to right foot, no erythema, no warmth, no drainage, no signs of infection noted, Capillary fill time <3 seconds in all digits, gross sensation present via light touch to right foot.  There is mild deviation of the second toe otherwise all other toes appear to be rectus as possible for patient status.  No pain or crepitation with range of motion of right foot.  No pain with calf compression.   Assessment and Plan:  Problem List Items Addressed This Visit    None    Visit Diagnoses    Contracture of tendon    -  Primary   S/P foot surgery, right       Right foot pain            -Patient seen and evaluated -Applied strapping to the toe and advised patient to take her toes daily the same way if patient has irritation to skin advised patient to use her digital toe splint which she has or to look into getting yoga toe or toe socks to help splint toes and keep them in the correct position to keep the soft tissue stretched out after her procedure -Patient may return to normal shoe and may progress back to normal activities to tolerance -Advised patient to ice and elevate as necessary and may take Tylenol if needed for any pain -Will plan for strapping check at next office visit. In the meantime, patient to call office if any issues or problems arise.   Landis Martins, DPM

## 2018-03-23 ENCOUNTER — Encounter: Payer: Self-pay | Admitting: Sports Medicine

## 2018-03-23 ENCOUNTER — Ambulatory Visit (INDEPENDENT_AMBULATORY_CARE_PROVIDER_SITE_OTHER): Payer: Medicare Other | Admitting: Sports Medicine

## 2018-03-23 DIAGNOSIS — M2041 Other hammer toe(s) (acquired), right foot: Secondary | ICD-10-CM

## 2018-03-23 DIAGNOSIS — M2011 Hallux valgus (acquired), right foot: Secondary | ICD-10-CM

## 2018-03-23 DIAGNOSIS — M6240 Contracture of muscle, unspecified site: Secondary | ICD-10-CM

## 2018-03-23 DIAGNOSIS — M79671 Pain in right foot: Secondary | ICD-10-CM

## 2018-03-23 DIAGNOSIS — M2031 Hallux varus (acquired), right foot: Secondary | ICD-10-CM

## 2018-03-23 DIAGNOSIS — Z9889 Other specified postprocedural states: Secondary | ICD-10-CM

## 2018-03-23 NOTE — Progress Notes (Signed)
Subjective: Melissa Lozano is a 77 y.o. female patient seen today in office for POV #2 (DOS 02-25-18), S/P extensor tenotomy of right second and third toes due to tendon contracture and scar tissue that was performed in office. Patient denies pain at surgical site, states that she is able to wear a shoe comfortably, denies calf pain, denies headache, chest pain, shortness of breath, nausea, vomiting, fever, or chills. Patient states that she still has a slight curvature to her toes and try to use the yoga toe however it causes pain when it tries to stretch her toes thus feels like it was a waste of money.  Patient also admits that there is some thickening of the left second toenail and wants to discuss options about this states that she has noticed that the nail has been black for several years and wants to have it checked concerning for melanoma.  No other issues noted.   Patient Active Problem List   Diagnosis Date Noted  . C. difficile colitis 06/10/2017  . Migraine 06/10/2017  . Osteoporosis 06/10/2017  . Old MI (myocardial infarction) 06/10/2017  . Angina pectoris ([place]) 08/16/2015  . Coronary artery spasm ([place]) 08/16/2015  . Essential hypertension 08/16/2015  . Hyperlipidemia 08/16/2015  . PAT (paroxysmal atrial tachycardia) ([place]) 08/16/2015    Current Outpatient Medications on File Prior to Visit  Medication Sig Dispense Refill  . alendronate (FOSAMAX) 70 MG tablet Take 70 mg by mouth once a week.  4  . aspirin EC 81 MG tablet Take 81 mg by mouth.    Marland Kitchen atorvastatin (LIPITOR) 10 MG tablet Take 1 tablet (10 mg total) by mouth 3 (three) times a week. 90 tablet 3  . Calcium Carb-Ergocalciferol 500-200 MG-UNIT TABS Take by mouth.    . ciprofloxacin (CIPRO) 500 MG tablet Take 1 tablet (500 mg total) by mouth 2 (two) times daily. 20 tablet 0  . cloNIDine (CATAPRES) 0.1 MG tablet Take 0.1 mg by mouth.    . diazepam (VALIUM) 5 MG tablet   0  . DOCOSAHEXAENOIC ACID PO Take by mouth.    . docusate  sodium (COLACE) 100 MG capsule Take 100 mg by mouth 2 (two) times daily.    . metroNIDAZOLE (METROCREAM) 0.75 % cream   2  . Multiple Vitamin (MULTI-VITAMINS) TABS Take by mouth.    . nitroGLYCERIN (NITROSTAT) 0.4 MG SL tablet Place 0.4 mg under the tongue.    Marland Kitchen oxyCODONE-acetaminophen (PERCOCET) 10-325 MG tablet Take 1 tablet by mouth every 8 (eight) hours as needed for pain. 20 tablet 0  . polyethylene glycol powder (GLYCOLAX/MIRALAX) powder MIX 17GMS (1 CAPFUL) IN 8OZ OF LIQUID AND DRINK ONCE DAILY    . promethazine (PHENERGAN) 25 MG tablet Take 25 mg by mouth every 8 (eight) hours as needed for nausea or vomiting (Take 1 tablet by mouth every 8 hours as needed for nausea).    . psyllium (METAMUCIL) 58.6 % packet Take 1 packet by mouth daily.    Marland Kitchen spironolactone-hydrochlorothiazide (ALDACTAZIDE) 25-25 MG per tablet   3  . SUMAtriptan (IMITREX) 25 MG tablet Take 25 mg by mouth.    . verapamil (CALAN-SR) 180 MG CR tablet Take 1 tablet (180 mg total) by mouth 2 (two) times daily. 180 tablet 3  . Vitamins-Lipotropics (LIPOFLAVOVIT) TABS Take by mouth.     Current Facility-Administered Medications on File Prior to Visit  Medication Dose Route Frequency Provider Last Rate Last Dose  . dexamethasone (DECADRON) injection 4 mg  4 mg Intra-articular  Once Landis Martins, DPM      . triamcinolone acetonide (KENALOG) 10 MG/ML injection 10 mg  10 mg Other Once Landis Martins, DPM        Allergies  Allergen Reactions  . Codeine   . Sulfa Antibiotics   . Tape     Objective: There were no vitals filed for this visit.  General: No acute distress, AAOx3  Right foot: Stab incisions well-healed with no gapping or dehiscence at surgical sites, minimal swelling to right foot, no erythema, no warmth, no drainage, no signs of infection noted, Capillary fill time <3 seconds in all digits, gross sensation present via light touch to right foot.  There is mild deviation of the second toe otherwise all other  toes appear to be rectus as possible for patient status.  No pain or crepitation with range of motion of right foot.  No pain with calf compression.   Left second toenail: [place] but severely thickened and mycotic with dried blood underneath suggestive of microtrauma  Assessment and Plan:  Problem List Items Addressed This Visit    None    Visit Diagnoses    Contracture of tendon    -  Primary   S/P foot surgery, right       Right foot pain       Varus deformity of right great toe       Hallux interphalangeus, acquired, right       Hammertoe of right foot           -Patient seen and evaluated -Discussed long-term care for right surgical foot advised patient to continue with strapping and if cosmetically she is still concerned about drifting of toes may consider arthrodesis of toes with internal pin and shortening osteotomies of second and third metatarsal heads; patient at this time does not have any pain in shoes and is very pleased with the overall results and does not want any surgery at this time but will continue to monitor her right foot in case anything changes where she decides to change her mind about possible further surgery -Continue with good supportive shoes -Advised patient for the left second toenail consider fungal culture versus nail procedure patient states that she will consider a removal of the left second toenail in the winter -Patient to return to office as needed. In the meantime, patient to call office if any issues or problems arise.   Landis Martins, DPM

## 2018-04-11 ENCOUNTER — Telehealth: Payer: Self-pay | Admitting: Cardiology

## 2018-04-11 NOTE — Telephone Encounter (Signed)
Noted. Thanks.

## 2018-04-11 NOTE — Telephone Encounter (Signed)
FYI: Pt walked into office(without an appt) c/o active CP-an RN was not available to assess pt so she was advised to go to the nearest ER/pt refused and left

## 2018-04-20 ENCOUNTER — Encounter: Payer: Self-pay | Admitting: Cardiology

## 2018-04-20 ENCOUNTER — Ambulatory Visit: Payer: Medicare Other | Admitting: Cardiology

## 2018-04-20 ENCOUNTER — Encounter: Payer: Self-pay | Admitting: *Deleted

## 2018-04-20 VITALS — BP 140/74 | HR 74 | Ht 63.0 in | Wt 154.0 lb

## 2018-04-20 DIAGNOSIS — I10 Essential (primary) hypertension: Secondary | ICD-10-CM

## 2018-04-20 DIAGNOSIS — I209 Angina pectoris, unspecified: Secondary | ICD-10-CM

## 2018-04-20 DIAGNOSIS — I201 Angina pectoris with documented spasm: Secondary | ICD-10-CM

## 2018-04-20 DIAGNOSIS — E785 Hyperlipidemia, unspecified: Secondary | ICD-10-CM | POA: Diagnosis not present

## 2018-04-20 MED ORDER — NITROGLYCERIN 0.4 MG SL SUBL: 0.4000 mg | SUBLINGUAL_TABLET | SUBLINGUAL | 11 refills | Status: DC | PRN

## 2018-04-20 NOTE — Progress Notes (Signed)
Cardiology Office Note:    Date:  04/20/2018   ID:  Melissa Lozano, DOB 04/15/1941, MRN [id number]  PCP:  Nicoletta Dress, MD  Cardiologist:  Shirlee More, MD    Referring MD: Nicoletta Dress, MD   ASSESSMENT:    1. Angina pectoris ([place])   2. 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 Essential hypertension   4. Hyperlipidemia, unspecified hyperlipidemia type    PLAN:    In order of problems listed above:  1. She had an episode of angina coincident with the one year anniversary of her husband's death and was not particularly severe she took nitroglycerin and it was resolved she just felt badly afterwards.  And made her apprehensive.  Her EKG is normal at the time of her a heart attack in 2008 she had normal coronary angiography and I do not think she requires a repeat ischemia evaluation 2. See above stable she has had one episode of angina in the last year we renewed her prescription nitroglycerin we will continue current medical therapy unless she is having recurrent frequent angina 3. Stable blood pressure target continue current treatment including calcium channel blocker 4. Stable continue her statin recent labs from PCP for renal function liver function lipid profile requested   Next appointment: 6 months   Medication Adjustments/Labs and Tests Ordered: Current medicines are reviewed at length with the patient today.  Concerns regarding medicines are outlined above.  No orders of the defined types were placed in this encounter.  No orders of the defined types were placed in this encounter.   Chief Complaint  Patient presents with  . Chest Pain    A week ago this past Monday, for a couple days, did take a Nitro    History of Present Illness:    Melissa Lozano is a 77 y.o. female with a hx of Coronary artery spasm, essential hypertension, hyperlipidemia and PAT last seen 09/01/17. Compliance with diet, lifestyle and medications: yes She had an episode of vague chest  discomfort that waxed and waned and was relieved after nitroglycerin coincident with the anniversary of her husband's death it was not severe was in the left chest not pleuritic no associated nausea or vomiting shortness of breath or diaphoresis it was nonexertional and as mentioned relieved after nitroglycerin.  She has had no need to take clonidine for accelerated hypertension and tolerates her current medical treatment including aspirin calcium channel blocker and statin and recent labs requested from her PCP she has bruising on aspirin with a history of myocardial infarction I asked her to continue low-dose Past Medical History:  Diagnosis Date  . Angina pectoris ([place]) 08/16/2015   Overview:  stable, no recurrence of her present meds and continue same with a CCB, aspirin and a statin. Stress echo negative for ischemia at 10 mets 08/23/13  . C. difficile colitis 06/10/2017  . 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 Essential hypertension 08/16/2015  . Hyperlipidemia 08/16/2015  . Hypertension   . Migraine 06/10/2017  . 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 Osteoporosis   . PAT (paroxysmal atrial tachycardia) ([place]) 08/16/2015    Past Surgical History:  Procedure Laterality Date  . ABDOMINAL HYSTERECTOMY    . CARDIAC CATHETERIZATION    . HAMMER TOE SURGERY      Current Medications: Current Meds  Medication Sig  . alendronate (FOSAMAX) 70 MG tablet Take 70 mg by mouth  once a week.  Marland Kitchen aspirin EC 81 MG tablet Take 81 mg by mouth.  Marland Kitchen atorvastatin (LIPITOR) 10 MG tablet Take 1 tablet (10 mg total) by mouth 3 (three) times a week.  . Calcium Carb-Ergocalciferol 500-200 MG-UNIT TABS Take by mouth.  . cloNIDine (CATAPRES) 0.1 MG tablet Take 0.1 mg by mouth.  . diazepam (VALIUM) 5 MG tablet Take 5 mg by mouth as needed.   . DOCOSAHEXAENOIC ACID PO Take 1 capsule by mouth daily.   . Multiple Vitamin (MULTI-VITAMINS)  TABS Take by mouth.  . nitroGLYCERIN (NITROSTAT) 0.4 MG SL tablet Place 0.4 mg under the tongue.  . polyethylene glycol powder (GLYCOLAX/MIRALAX) powder MIX 17GMS (1 CAPFUL) IN 8OZ OF LIQUID AND DRINK ONCE DAILY  . psyllium (METAMUCIL) 58.6 % packet Take 1 packet by mouth daily.  Marland Kitchen spironolactone-hydrochlorothiazide (ALDACTAZIDE) 25-25 MG per tablet Take 1 tablet by mouth 3 (three) times a week.   . verapamil (CALAN-SR) 180 MG CR tablet Take 1 tablet (180 mg total) by mouth 2 (two) times daily.  . Vitamins-Lipotropics (LIPOFLAVOVIT) TABS Take by mouth.   Current Facility-Administered Medications for the 04/20/18 encounter (Office Visit) with Richardo Priest, MD  Medication  . dexamethasone (DECADRON) injection 4 mg  . triamcinolone acetonide (KENALOG) 10 MG/ML injection 10 mg     Allergies:   Codeine; Sulfa antibiotics; and Tape   Social History   Socioeconomic History  . Marital status: Married    Spouse name: Not on file  . Number of children: Not on file  . Years of education: Not on file  . Highest education level: Not on file  Occupational History  . Not on file  Social Needs  . Financial resource strain: Not on file  . Food insecurity:    Worry: Not on file    Inability: Not on file  . Transportation needs:    Medical: Not on file    Non-medical: Not on file  Tobacco Use  . Smoking status: Never Smoker  . Smokeless tobacco: Never Used  Substance and Sexual Activity  . Alcohol use: No    Alcohol/week: 0.0 oz    Comment: occasional  . Drug use: No  . Sexual activity: Not on file  Lifestyle  . Physical activity:    Days per week: Not on file    Minutes per session: Not on file  . Stress: Not on file  Relationships  . Social connections:    Talks on phone: Not on file    Gets together: Not on file    Attends religious service: Not on file    Active member of club or organization: Not on file    Attends meetings of clubs or organizations: Not on file     Relationship status: Not on file  Other Topics Concern  . Not on file  Social History Narrative  . Not on file     Family History: The patient's family history includes Aneurysm in her father. ROS:   Please see the history of present illness.    All other systems reviewed and are negative.  EKGs/Labs/Other Studies Reviewed:    The following studies were reviewed today:  EKG:  EKG ordered today.  The ekg ordered today demonstrates sinus rhythm and normal  Recent Labs: No results found for requested labs within last 8760 hours.  Recent Lipid Panel No results found for: CHOL, TRIG, HDL, CHOLHDL, VLDL, LDLCALC, LDLDIRECT  Physical Exam:    VS:  BP 140/74   Pulse  74   Ht 5\' 3"  (1.6 m)   Wt 154 lb (69.9 kg)   SpO2 98%   BMI 27.28 kg/m     Wt Readings from Last 3 Encounters:  04/20/18 154 lb (69.9 kg)  09/01/17 152 lb (68.9 kg)  06/04/17 147 lb (66.7 kg)     GEN:  Well nourished, well developed in no acute distress HEENT: Normal NECK: No JVD; No carotid bruits LYMPHATICS: No lymphadenopathy CARDIAC: RRR, no murmurs, rubs, gallops RESPIRATORY:  Clear to auscultation without rales, wheezing or rhonchi  ABDOMEN: Soft, non-tender, non-distended MUSCULOSKELETAL:  No edema; No deformity  SKIN: Warm and dry NEUROLOGIC:  Alert and oriented x 3 PSYCHIATRIC:  Normal affect    Signed, Shirlee More, MD  04/20/2018 2:59 PM    [place]

## 2018-04-20 NOTE — Patient Instructions (Addendum)
Medication Instructions:  Your physician recommends that you continue on your current medications as directed. Please refer to the Current Medication list given to you today.   Labwork: None  Testing/Procedures: You had an EKG today.   Follow-Up: Your physician wants you to follow-up in: 6 months. You will receive a reminder letter in the mail two months in advance. If you don't receive a letter, please call our office to schedule the follow-up appointment.   If you need a refill on your cardiac medications before your next appointment, please call your pharmacy.   Thank you for choosing [facility]! Robyne Peers, RN [phone]      Angina Pectoris Angina pectoris is a very bad feeling in the chest, neck, or arm. Your doctor may call it angina. There are four types of angina. Angina is caused by a lack of blood in the middle and thickest layer of the heart wall (myocardium). Angina may feel like a crushing or squeezing pain in the chest. It may feel like tightness or heavy pressure in the chest. Some people say it feels like gas, heartburn, or indigestion. Some people have symptoms other than pain. These include:  Shortness of breath.  Cold sweats.  Feeling sick to your stomach (nausea).  Feeling light-headed.  Many women have chest discomfort and some of the other symptoms. However, women often have different symptoms, such as:  Feeling tired (fatigue).  Feeling nervous for no reason.  Feeling weak for no reason.  Dizziness or fainting.  Women may have angina without any symptoms. Follow these instructions at home:  Take medicines only as told by your doctor.  Take care of other health issues as told by your doctor. These include: ? High blood pressure (hypertension). ? Diabetes.  Follow a heart-healthy diet. Your doctor can help you to choose healthy food options and make changes.  Talk to your doctor to learn more about healthy cooking methods and  use them. These include: ? Roasting. ? Grilling. ? Broiling. ? Baking. ? Poaching. ? Steaming. ? Stir-frying.  Follow an exercise program approved by your doctor.  Keep a healthy weight. Lose weight as told by your doctor.  Rest when you are tired.  Learn to manage stress.  Do not use any tobacco, such as cigarettes, chewing tobacco, or electronic cigarettes. If you need help quitting, ask your doctor.  If you drink alcohol, and your doctor says it is okay, limit yourself to no more than 1 drink per day. One drink equals 12 ounces of beer, 5 ounces of wine, or 1 ounces of hard liquor.  Stop illegal drug use.  Keep all follow-up visits as told by your doctor. This is important. Do not take these medicines unless your doctor says that you can:  Nonsteroidal anti-inflammatory drugs (NSAIDs). These include: ? Ibuprofen. ? Naproxen. ? Celecoxib.  Vitamin supplements that have vitamin A, vitamin E, or both.  Hormone therapy that contains estrogen with or without progestin.  Get help right away if:  You have pain in your chest, neck, arm, jaw, stomach, or back that: ? Lasts more than a few minutes. ? Comes back. ? Does not get better after you take medicine under your tongue (sublingual nitroglycerin).  You have any of these symptoms for no reason: ? Gas, heartburn, or indigestion. ? Sweating a lot. ? Shortness of breath or trouble breathing. ? Feeling sick to your stomach or throwing up. ? Feeling more tired than usual. ? Feeling nervous or worrying more than  usual. ? Feeling weak. ? Diarrhea.  You are suddenly dizzy or light-headed.  You faint or pass out. These symptoms may be an emergency. Do not wait to see if the symptoms will go away. Get medical help right away. Call your local emergency services (911 in the [place] Do not drive yourself to the hospital. This information is not intended to replace advice given to you by your health care provider. Make sure you  discuss any questions you have with your health care provider. Document Released: 02/24/2008 Document Revised: 02/13/2016 Document Reviewed: 01/09/2014 Elsevier Interactive Patient Education  2017 [employer].

## 2018-09-25 NOTE — Progress Notes (Signed)
Cardiology Office Note:    Date:  09/26/2018   ID:  Melissa Lozano, DOB [birth year], MRN [id number]  PCP:  Nicoletta Dress, MD  Cardiologist:  Shirlee More, MD    Referring MD: Nicoletta Dress, MD    ASSESSMENT:    1. 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 Shortness of breath   3. PAT (paroxysmal atrial tachycardia) ([place])   4. Hyperlipidemia, unspecified hyperlipidemia type   5. Essential hypertension    PLAN:    In order of problems listed above:  1. She has had myocardial infarction subsequently angina with nonobstructive coronary arteries.  Continue current medical treatment including aspirin high intensity statin calcium channel blocker. 2. In the setting of bronchitis these individuals of bulbar at risk for developing heart failure reduced ejection fraction we will screen her with a proBNP level if significantly elevated echocardiogram regarding diastolic heart failure 3. Stable no recurrence continue calcium channel blocker and diuretic 4. Stable continue statin check CMP for kidney function potassium liver function and lipid profile 5. Stable blood pressure target continue current treatment including clonidine which she can take as needed for urgency   Next appointment: 6 months   Medication Adjustments/Labs and Tests Ordered: Current medicines are reviewed at length with the patient today.  Concerns regarding medicines are outlined above.  Orders Placed This Encounter  Procedures  . Lipid Profile  . Comp Met (CMET)  . Pro b natriuretic peptide (BNP)   No orders of the defined types were placed in this encounter.   No chief complaint on file.   History of Present Illness:    Melissa Lozano is a 78 y.o. female with a hx of Coronary artery spasm, essential hypertension, hyperlipidemia and PAT last seen 04/20/2018. Compliance with diet, lifestyle and medications: Yes  Cardiac perspective is done well no angina palpitations syncope or hypertensive urgency.   Unfortunately she has had cough bronchitis bronchospasm and is on her second round of antibiotic and steroid at this time.  She complains of shortness of breath with wheezing is at risk for heart failure check proBNP level. Past Medical History:  Diagnosis Date  . Angina pectoris ([place]) 08/16/2015   Overview:  stable, no recurrence of her present meds and continue same with a CCB, aspirin and a statin. Stress echo negative for ischemia at 10 mets 08/23/13  . C. difficile colitis 06/10/2017  . 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 Essential hypertension 08/16/2015  . Hyperlipidemia 08/16/2015  . Hypertension   . Migraine 06/10/2017  . 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 Osteoporosis   . PAT (paroxysmal atrial tachycardia) ([place]) 08/16/2015    Past Surgical History:  Procedure Laterality Date  . ABDOMINAL HYSTERECTOMY    . BUNIONECTOMY     reverse bunionectomy  . CARDIAC CATHETERIZATION    . HAMMER TOE SURGERY      Current Medications: Current Meds  Medication Sig  . alendronate (FOSAMAX) 70 MG tablet Take 70 mg by mouth once a week.  Marland Kitchen aspirin EC 81 MG tablet Take 81 mg by mouth.  Marland Kitchen atorvastatin (LIPITOR) 10 MG tablet Take 1 tablet (10 mg total) by mouth 3 (three) times a week.  . benzonatate (TESSALON) 200 MG capsule Take 200 mg by mouth 3 (three) times daily as needed for cough.  . Calcium Carb-Ergocalciferol 500-200 MG-UNIT TABS Take by mouth.  . cefUROXime (CEFTIN) 500 MG tablet Take 500  mg by mouth.  . cloNIDine (CATAPRES) 0.1 MG tablet Take 0.1 mg by mouth.  . dextromethorphan-guaiFENesin (MUCINEX DM) 30-600 MG 12hr tablet Take 1 tablet by mouth 2 (two) times daily as needed for cough.  . diazepam (VALIUM) 5 MG tablet Take 5 mg by mouth as needed.   . DOCOSAHEXAENOIC ACID PO Take 1 capsule by mouth daily.   . Multiple Vitamin (MULTI-VITAMINS) TABS Take by mouth.  . nitroGLYCERIN (NITROSTAT) 0.4  MG SL tablet Place 1 tablet (0.4 mg total) under the tongue every 5 (five) minutes as needed for chest pain.  . polyethylene glycol powder (GLYCOLAX/MIRALAX) powder MIX 17GMS (1 CAPFUL) IN 8OZ OF LIQUID AND DRINK ONCE DAILY  . predniSONE (DELTASONE) 10 MG tablet Take 10 mg by mouth as directed. Take 6 tablets x 3 days, 4 tablets x 3 days, 2 tablets x 3 days, and 1 tablet x 3 days  . psyllium (METAMUCIL) 58.6 % packet Take 1 packet by mouth daily.  Marland Kitchen spironolactone-hydrochlorothiazide (ALDACTAZIDE) 25-25 MG per tablet Take 1 tablet by mouth 3 (three) times a week.   . Tiotropium Bromide Monohydrate (SPIRIVA RESPIMAT) 2.5 MCG/ACT AERS Inhale 2.5 mcg into the lungs as directed.  . verapamil (CALAN-SR) 180 MG CR tablet Take 1 tablet (180 mg total) by mouth 2 (two) times daily.  . Vitamins-Lipotropics (LIPOFLAVOVIT) TABS Take by mouth.   Current Facility-Administered Medications for the 09/26/18 encounter (Office Visit) with Richardo Priest, MD  Medication  . dexamethasone (DECADRON) injection 4 mg  . triamcinolone acetonide (KENALOG) 10 MG/ML injection 10 mg     Allergies:   Codeine; Sulfa antibiotics; and Tape   Social History   Socioeconomic History  . Marital status: Married    Spouse name: Not on file  . Number of children: Not on file  . Years of education: Not on file  . Highest education level: Not on file  Occupational History  . Not on file  Social Needs  . Financial resource strain: Not on file  . Food insecurity:    Worry: Not on file    Inability: Not on file  . Transportation needs:    Medical: Not on file    Non-medical: Not on file  Tobacco Use  . Smoking status: Never Smoker  . Smokeless tobacco: Never Used  Substance and Sexual Activity  . Alcohol use: No    Alcohol/week: 0.0 standard drinks    Comment: occasional  . Drug use: No  . Sexual activity: Not on file  Lifestyle  . Physical activity:    Days per week: Not on file    Minutes per session: Not on file   . Stress: Not on file  Relationships  . Social connections:    Talks on phone: Not on file    Gets together: Not on file    Attends religious service: Not on file    Active member of club or organization: Not on file    Attends meetings of clubs or organizations: Not on file    Relationship status: Not on file  Other Topics Concern  . Not on file  Social History Narrative  . Not on file     Family History: The patient's family history includes Aneurysm in her father. ROS:   Please see the history of present illness.    All other systems reviewed and are negative.  EKGs/Labs/Other Studies Reviewed:    The following studies were reviewed today:   Recent Labs: No results found for requested labs  within last 8760 hours.  Recent Lipid Panel No results found for: CHOL, TRIG, HDL, CHOLHDL, VLDL, LDLCALC, LDLDIRECT  Physical Exam:    VS:  BP 140/76 (BP Location: Right Arm, Patient Position: Sitting, Cuff Size: Normal)   Pulse 80   Ht 5' 3.5" (1.613 m)   Wt 153 lb 6 oz (69.6 kg)   SpO2 96%   BMI 26.74 kg/m     Wt Readings from Last 3 Encounters:  09/26/18 153 lb 6 oz (69.6 kg)  04/20/18 154 lb (69.9 kg)  09/01/17 152 lb (68.9 kg)     GEN:  Well nourished, well developed in no acute distress HEENT: Normal NECK: No JVD; No carotid bruits LYMPHATICS: No lymphadenopathy CARDIAC: RRR, no murmurs, rubs, gallops RESPIRATORY:  Clear to auscultation without rales, wheezing or rhonchi  ABDOMEN: Soft, non-tender, non-distended MUSCULOSKELETAL:  No edema; No deformity  SKIN: Warm and dry NEUROLOGIC:  Alert and oriented x 3 PSYCHIATRIC:  Normal affect    Signed, Shirlee More, MD  09/26/2018 4:56 PM    [place]

## 2018-09-26 ENCOUNTER — Encounter: Payer: Self-pay | Admitting: Cardiology

## 2018-09-26 ENCOUNTER — Ambulatory Visit: Payer: Medicare Other | Admitting: Cardiology

## 2018-09-26 VITALS — BP 140/76 | HR 80 | Ht 63.5 in | Wt 153.4 lb

## 2018-09-26 DIAGNOSIS — I10 Essential (primary) hypertension: Secondary | ICD-10-CM

## 2018-09-26 DIAGNOSIS — I201 Angina pectoris with documented spasm: Secondary | ICD-10-CM | POA: Diagnosis not present

## 2018-09-26 DIAGNOSIS — R0602 Shortness of breath: Secondary | ICD-10-CM | POA: Diagnosis not present

## 2018-09-26 DIAGNOSIS — E785 Hyperlipidemia, unspecified: Secondary | ICD-10-CM

## 2018-09-26 DIAGNOSIS — I471 Supraventricular tachycardia: Secondary | ICD-10-CM | POA: Diagnosis not present

## 2018-09-26 NOTE — Patient Instructions (Signed)
Medication Instructions:  Your physician recommends that you continue on your current medications as directed. Please refer to the Current Medication list given to you today.  If you need a refill on your cardiac medications before your next appointment, please call your pharmacy.   Lab work: Your physician recommends that you return for lab work today: lipid panel, CMP, ProBNP.   If you have labs (blood work) drawn today and your tests are completely normal, you will receive your results only by: . MyChart Message (if you have MyChart) OR . A paper copy in the mail If you have any lab test that is abnormal or we need to change your treatment, we will call you to review the results.  Testing/Procedures: None  Follow-Up: At [facility], you and your health needs are our priority.  As part of our continuing mission to provide you with exceptional heart care, we have created designated Provider Care Teams.  These Care Teams include your primary Cardiologist (physician) and Advanced Practice Providers (APPs -  Physician Assistants and Nurse Practitioners) who all work together to provide you with the care you need, when you need it. You will need a follow up appointment in 6 months.  Please call our office 2 months in advance to schedule this appointment.   

## 2018-09-27 LAB — COMPREHENSIVE METABOLIC PANEL
ALT: 16 IU/L (ref 0–32)
AST: 21 IU/L (ref 0–40)
Albumin/Globulin Ratio: 2.3 — ABNORMAL HIGH (ref 1.2–2.2)
Albumin: 4.3 g/dL (ref 3.5–4.8)
Alkaline Phosphatase: 69 IU/L (ref 39–117)
BUN/Creatinine Ratio: 24 (ref 12–28)
BUN: 18 mg/dL (ref 8–27)
Bilirubin Total: 0.2 mg/dL (ref 0.0–1.2)
CHLORIDE: 100 mmol/L (ref 96–106)
CO2: 27 mmol/L (ref 20–29)
Calcium: 9.5 mg/dL (ref 8.7–10.3)
Creatinine, Ser: 0.76 mg/dL (ref 0.57–1.00)
GFR calc non Af Amer: 75 mL/min/{1.73_m2} (ref >59)
GFR, EST AFRICAN AMERICAN: 87 mL/min/{1.73_m2} (ref >59)
Globulin, Total: 1.9 g/dL (ref 1.5–4.5)
Glucose: 166 mg/dL — ABNORMAL HIGH (ref 65–99)
Potassium: 4.9 mmol/L (ref 3.5–5.2)
Sodium: 138 mmol/L (ref 134–144)
Total Protein: 6.2 g/dL (ref 6.0–8.5)

## 2018-09-27 LAB — LIPID PANEL
Chol/HDL Ratio: 2.4 ratio (ref 0.0–4.4)
Cholesterol, Total: 213 mg/dL — ABNORMAL HIGH (ref 100–199)
HDL: 88 mg/dL (ref >39)
LDL Calculated: 115 mg/dL — ABNORMAL HIGH (ref 0–99)
Triglycerides: 50 mg/dL (ref 0–149)
VLDL Cholesterol Cal: 10 mg/dL (ref 5–40)

## 2018-09-27 LAB — PRO B NATRIURETIC PEPTIDE: NT-Pro BNP: 216 pg/mL (ref 0–738)

## 2018-10-19 ENCOUNTER — Other Ambulatory Visit: Payer: Self-pay | Admitting: Sports Medicine

## 2018-10-19 ENCOUNTER — Ambulatory Visit (INDEPENDENT_AMBULATORY_CARE_PROVIDER_SITE_OTHER): Payer: Medicare Other

## 2018-10-19 ENCOUNTER — Encounter: Payer: Self-pay | Admitting: Sports Medicine

## 2018-10-19 ENCOUNTER — Ambulatory Visit: Payer: Medicare Other | Admitting: Sports Medicine

## 2018-10-19 DIAGNOSIS — M2550 Pain in unspecified joint: Secondary | ICD-10-CM | POA: Diagnosis not present

## 2018-10-19 DIAGNOSIS — M21171 Varus deformity, not elsewhere classified, right ankle: Secondary | ICD-10-CM

## 2018-10-19 DIAGNOSIS — M79672 Pain in left foot: Secondary | ICD-10-CM

## 2018-10-19 DIAGNOSIS — M779 Enthesopathy, unspecified: Secondary | ICD-10-CM

## 2018-10-19 DIAGNOSIS — M6240 Contracture of muscle, unspecified site: Secondary | ICD-10-CM | POA: Diagnosis not present

## 2018-10-19 DIAGNOSIS — M2141 Flat foot [pes planus] (acquired), right foot: Secondary | ICD-10-CM

## 2018-10-19 DIAGNOSIS — M79671 Pain in right foot: Secondary | ICD-10-CM | POA: Diagnosis not present

## 2018-10-19 DIAGNOSIS — M2142 Flat foot [pes planus] (acquired), left foot: Secondary | ICD-10-CM

## 2018-10-19 DIAGNOSIS — M2031 Hallux varus (acquired), right foot: Secondary | ICD-10-CM

## 2018-10-19 NOTE — Progress Notes (Signed)
Subjective: Melissa Lozano is a 78 y.o. female patient who presents to office for evaluation of bilateral foot pain. Patient complains of progressive pain especially over the last 6 months off and on at both the medial aspect of her foot and ankle states that pain is sharp sometimes when walking but reports that she had a past diagnosis of tendinitis and has been taking Motrin which helps and also went on the Internet and bought a brace for her left foot which seems to help a little bit however with certain motions and movements of her left foot and ankle the pain intensifies more so than the right.  Patient is also concerned about her toes that she had surgery on last year with me starting to occur again and she is worried that she may get a corn on the lateral side of her right third toe if things keep rubbing that she has noticed that the toes on the right have slowly gotten worse over the last 3 months. Patient denies any other pedal complaints. Denies injury/trip/fall/sprain/any causative factors.   Patient Active Problem List   Diagnosis Date Noted  . C. difficile colitis 06/10/2017  . Migraine 06/10/2017  . Osteoporosis 06/10/2017  . Old MI (myocardial infarction) 06/10/2017  . Angina pectoris ([place]) 08/16/2015  . 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 Essential hypertension 08/16/2015  . Hyperlipidemia 08/16/2015  . PAT (paroxysmal atrial tachycardia) ([place]) 08/16/2015    Current Outpatient Medications on File Prior to Visit  Medication Sig Dispense Refill  . alendronate (FOSAMAX) 70 MG tablet Take 70 mg by mouth once a week.  4  . aspirin EC 81 MG tablet Take 81 mg by mouth.    Marland Kitchen atorvastatin (LIPITOR) 10 MG tablet Take 1 tablet (10 mg total) by mouth 3 (three) times a week. 90 tablet 3  . benzonatate (TESSALON) 200 MG capsule Take 200 mg by mouth 3 (three) times daily as needed for cough.    . Calcium Carb-Ergocalciferol 500-200 MG-UNIT TABS Take by mouth.    . cefUROXime (CEFTIN)  500 MG tablet Take 500 mg by mouth.    . cloNIDine (CATAPRES) 0.1 MG tablet Take 0.1 mg by mouth.    . dextromethorphan-guaiFENesin (MUCINEX DM) 30-600 MG 12hr tablet Take 1 tablet by mouth 2 (two) times daily as needed for cough.    . diazepam (VALIUM) 5 MG tablet Take 5 mg by mouth as needed.   0  . DOCOSAHEXAENOIC ACID PO Take 1 capsule by mouth daily.     . Multiple Vitamin (MULTI-VITAMINS) TABS Take by mouth.    . nitroGLYCERIN (NITROSTAT) 0.4 MG SL tablet Place 1 tablet (0.4 mg total) under the tongue every 5 (five) minutes as needed for chest pain. 25 tablet 11  . polyethylene glycol powder (GLYCOLAX/MIRALAX) powder MIX 17GMS (1 CAPFUL) IN 8OZ OF LIQUID AND DRINK ONCE DAILY    . predniSONE (DELTASONE) 10 MG tablet Take 10 mg by mouth as directed. Take 6 tablets x 3 days, 4 tablets x 3 days, 2 tablets x 3 days, and 1 tablet x 3 days    . psyllium (METAMUCIL) 58.6 % packet Take 1 packet by mouth daily.    Marland Kitchen spironolactone-hydrochlorothiazide (ALDACTAZIDE) 25-25 MG per tablet Take 1 tablet by mouth 3 (three) times a week.   3  . Tiotropium Bromide Monohydrate (SPIRIVA RESPIMAT) 2.5 MCG/ACT AERS Inhale 2.5 mcg into the lungs as directed.    . verapamil (CALAN-SR) 180 MG CR tablet Take 1 tablet (180  mg total) by mouth 2 (two) times daily. 180 tablet 3  . Vitamins-Lipotropics (LIPOFLAVOVIT) TABS Take by mouth.     Current Facility-Administered Medications on File Prior to Visit  Medication Dose Route Frequency Provider Last Rate Last Dose  . dexamethasone (DECADRON) injection 4 mg  4 mg Intra-articular Once Adreana Coull, DPM      . triamcinolone acetonide (KENALOG) 10 MG/ML injection 10 mg  10 mg Other Once Landis Martins, DPM        Allergies  Allergen Reactions  . Codeine Itching  . Sulfa Antibiotics Nausea Only  . Tape Itching    Objective:  General: Alert and oriented x3 in no acute distress  Dermatology: Old scars from surgery well healed. No open lesions bilateral lower  extremities, no webspace macerations, no ecchymosis bilateral, all nails x 10 are well manicured.  Vascular: Dorsalis Pedis and Posterior Tibial pedal pulses palpable, Capillary Fill Time 3 seconds,(+) pedal hair growth bilateral, no edema bilateral lower extremities, Temperature gradient within normal limits.  Neurology: Gross sensation intact via light touch bilateral  Musculoskeletal: Mild tenderness with palpation at lateral aspect of Right 3rd toe, varus digital deformity and recurrent contracture of all toes 1-4 on right, pes planus with heel valgus and stress noted and pain to PT tendon bilateral L>R, + with eversion bilateral. Strength within normal limits in all groups bilateral.   Gait: Antalgic gait  Xrays  Right and left Foot   Impression: Normal osseous mineralization, there is midtarsal breech supportive of pes planus deformity with increased prominence of the navicular noted left greater than right, there is digital deformity bilateral with deviation at the level of the metatarsophalangeal joints 2 through 5 right greater than left with significant subluxation and deviation of right great toe, hardware intact from previous surgery.  Mild soft tissue swelling.  No other acute findings.  Assessment and Plan: Problem List Items Addressed This Visit    None    Visit Diagnoses    Tendonitis    -  Primary   Bilateral foot pain       Arthralgia, unspecified joint       R/o RA   Relevant Orders   CBC with Differential (Completed)   Basic Metabolic Panel (Completed)   HLA-B27 Antigen (Completed)   ANA (Completed)   Rheumatoid factor (Completed)   Uric Acid (Completed)   TSH (Completed)   T4, Free (Completed)   T3, Free (Completed)   C-reactive protein (Completed)   Sedimentation Rate (Completed)   Contracture of tendon       Varus deformity of right great toe       Varus foot deformity, acquired, right       Pes planus of both feet         -Complete examination  performed -Xrays reviewed -Discussed treatement options for recurrent digital deformity on right foot highly suspicious of rheumatoid and tendinitis bilateral secondary to pes planus -Rx arthritic panel to evaluate for rheumatoid arthritis or any type of underlying inflammatory joint condition that can be causing repeat failure of surgery procedures on right -Advised patient to use toe As dispensed over the third toe to prevent rubbing and irritation -Advised patient to try a lace up ankle brace on her left if this does not help then may return back to using her neoprene ankle brace -Offered patient a stronger anti-inflammatory however she declined and wants to continue with Motrin at this time -Advised patient good supportive shoes -Patient to return to office when  call after her blood work is completed or sooner if condition worsens.  Landis Martins, DPM

## 2018-10-19 NOTE — Patient Instructions (Signed)
bmp 

## 2018-10-24 DIAGNOSIS — R079 Chest pain, unspecified: Secondary | ICD-10-CM | POA: Diagnosis not present

## 2018-10-24 LAB — BASIC METABOLIC PANEL
BUN/Creatinine Ratio: 26 (ref 12–28)
BUN: 18 mg/dL (ref 8–27)
CO2: 32 mmol/L — AB (ref 20–29)
Calcium: 9.7 mg/dL (ref 8.7–10.3)
Chloride: 100 mmol/L (ref 96–106)
Creatinine, Ser: 0.68 mg/dL (ref 0.57–1.00)
GFR calc Af Amer: 97 mL/min/{1.73_m2} (ref >59)
GFR calc non Af Amer: 84 mL/min/{1.73_m2} (ref >59)
GLUCOSE: 91 mg/dL (ref 65–99)
Potassium: 4.1 mmol/L (ref 3.5–5.2)
Sodium: 137 mmol/L (ref 134–144)

## 2018-10-24 LAB — HLA-B27 ANTIGEN: HLA B27: NEGATIVE

## 2018-10-24 LAB — CBC WITH DIFFERENTIAL/PLATELET
Basophils Absolute: 0 10*3/uL (ref 0.0–0.2)
Basos: 1 %
EOS (ABSOLUTE): 0.4 10*3/uL (ref 0.0–0.4)
Eos: 6 %
Hematocrit: 39.6 % (ref 34.0–46.6)
Hemoglobin: 13.6 g/dL (ref 11.1–15.9)
Lymphocytes Absolute: 1.2 10*3/uL (ref 0.7–3.1)
Lymphs: 20 %
MCH: 30.2 pg (ref 26.6–33.0)
MCHC: 34.3 g/dL (ref 31.5–35.7)
MCV: 88 fL (ref 79–97)
MONOS ABS: 0.5 10*3/uL (ref 0.1–0.9)
Monocytes: 9 %
Neutrophils Absolute: 3.9 10*3/uL (ref 1.4–7.0)
Neutrophils: 64 %
PLATELETS: 261 10*3/uL (ref 150–450)
RBC: 4.51 x10E6/uL (ref 3.77–5.28)
RDW: 14.2 % (ref 11.7–15.4)
WBC: 6 10*3/uL (ref 3.4–10.8)

## 2018-10-24 LAB — T4, FREE: Free T4: 1.03 ng/dL (ref 0.82–1.77)

## 2018-10-24 LAB — SEDIMENTATION RATE: SED RATE: 11 mm/h (ref 0–40)

## 2018-10-24 LAB — ANA: ANA Titer 1: NEGATIVE

## 2018-10-24 LAB — C-REACTIVE PROTEIN: CRP: 4 mg/L (ref 0–10)

## 2018-10-24 LAB — RHEUMATOID FACTOR: Rheumatoid fact SerPl-aCnc: 10.1 IU/mL (ref 0.0–13.9)

## 2018-10-24 LAB — URIC ACID: Uric Acid: 3.1 mg/dL (ref 2.5–7.1)

## 2018-10-24 LAB — TSH: TSH: 4.12 u[IU]/mL (ref 0.450–4.500)

## 2018-10-24 LAB — T3, FREE: T3, Free: 2.4 pg/mL (ref 2.0–4.4)

## 2018-10-27 ENCOUNTER — Telehealth: Payer: Self-pay | Admitting: Sports Medicine

## 2018-10-27 NOTE — Telephone Encounter (Signed)
Val is to call her

## 2018-10-27 NOTE — Telephone Encounter (Signed)
Dr. Stover please advise 

## 2018-10-27 NOTE — Telephone Encounter (Signed)
I'm calling about my blood work results.

## 2018-11-01 ENCOUNTER — Telehealth: Payer: Self-pay | Admitting: *Deleted

## 2018-11-01 NOTE — Telephone Encounter (Signed)
-----   Message from Landis Martins, [place] sent at 10/27/2018  4:30 PM EST ----- Will you let patient know that her arthritic panel is negative but her glucose was elevated. Please order A1c and mail requisition to patient for her to go to [facility] to r/o Diabetes which could affect how she has healed after surgery. After patient has had A1c lab test done have her call office for appointment to further discuss treatment plan -Dr. Cannon Kettle

## 2018-11-01 NOTE — Telephone Encounter (Signed)
Pt called an states she has been waiting for the results since 01/292/2020. I told pt I had the results, but may have not had at the time she called and could only give them once reviewed by Dr. Cannon Kettle. I informed pt that her glucose 13 days was within normal ranges and 1 month ago was elevated and wanted pt to have hemoglobin A1c for more information on a history of how her glucose run. Pt states she will have either her cardiologist or PCP run the test. I told pt I could mail her results to take to either doctor. Mailed results to pt.

## 2018-11-01 NOTE — Telephone Encounter (Signed)
Pt contacted pt through Result Notes.

## 2018-11-07 ENCOUNTER — Other Ambulatory Visit: Payer: Self-pay | Admitting: Cardiology

## 2018-11-10 ENCOUNTER — Ambulatory Visit: Payer: Medicare Other | Admitting: Cardiology

## 2018-11-25 ENCOUNTER — Other Ambulatory Visit: Payer: Self-pay | Admitting: Sports Medicine

## 2018-11-25 ENCOUNTER — Telehealth: Payer: Self-pay | Admitting: Sports Medicine

## 2018-11-25 MED ORDER — DICLOFENAC SODIUM 1 % TD GEL: 4.0000 g | Freq: Four times a day (QID) | TRANSDERMAL | 1 refills | Status: DC

## 2018-11-25 NOTE — Progress Notes (Signed)
Sent Diclofenac to patient pharmacy -Dr Cannon Kettle

## 2018-11-25 NOTE — Telephone Encounter (Signed)
Patient called requesting a refill on her diclofenac 1% ointment. Please give patient a call.

## 2018-11-25 NOTE — Telephone Encounter (Signed)
Sent Diclofenac to patient pharmacy

## 2018-11-27 NOTE — Progress Notes (Signed)
Cardiology Office Note:    Date:  11/28/2018   ID:  Melissa Lozano, DOB [birth year], MRN [id number]  PCP:  Nicoletta Dress, MD  Cardiologist:  Shirlee More, MD    Referring MD: Nicoletta Dress, MD    ASSESSMENT:    1. Coronary artery spasm ([place])   2. PAT (paroxysmal atrial tachycardia) ([place])   3. Essential hypertension   4. Hyperlipidemia, unspecified hyperlipidemia type   5. Gallbladder polyp    PLAN:    In order of problems listed above:  1. Stable recent [facility] brief admission 10/24/2018 nonanginal chest pain normal cardiac enzymes chest x-ray EKG and myocardial perfusion study with a small fixed defect consistent with attenuation otherwise normal.  No evidence of scar or ischemia.  Subsequently found to have gallbladder polyps and referred to surgery by GI. 2. Stable no recurrence continue current treatment rate limiting calcium channel blocker 3. Stable blood pressure target continue current treatment including clonidine as needed diuretic calcium channel blocker 4. Stable lipids at target continue high intensity statin 5. She has been referred to surgery for an opinion regarding cholecystectomy with gallbladder polyps as the etiology of her atypical chest pain note she has had previous normal coronary arteriography in my opinion and normal variant myocardial perfusion study last month   Next appointment: 6 months   Medication Adjustments/Labs and Tests Ordered: Current medicines are reviewed at length with the patient today.  Concerns regarding medicines are outlined above.  No orders of the defined types were placed in this encounter.  No orders of the defined types were placed in this encounter.   Chief Complaint  Patient presents with  . Follow-up    Reason [facility] admission 10/24/2018    History of Present Illness:    Melissa Lozano is a 78 y.o. female with a hx of  Coronary artery spasm, essential hypertension, hyperlipidemia and PAT  last seen 09/26/2018.  Because of shortness of breath a proBNP level was performed and was low at 216. Compliance with diet, lifestyle and medications: Yes  Recently she has struggled with a persistent respiratory infection requiring antibiotics.  She has had an emergency room visit for that and subsequent is back on 10/24/2018 for chest pain at rest no relief with nitroglycerin.  Her labs including troponin were normal EKG normal chest x-ray normal admitted to the hospital underwent a myocardial perfusion study with a small focal area of attenuation no infarction no ischemia.  Subsequently seen by GI Dr. Melina Copa duplex shows the presence of multiple polyps.  He has referred her to surgery for consideration of cholecystectomy.  From my perspective she is at low risk and would in the setting of normal coronary arteriography and recent normal myocardial perfusion study requires no further preoperative cardiology evaluation.  I will send a copy this to her surgeon. Past Medical History:  Diagnosis Date  . Angina pectoris ([place]) 08/16/2015   Overview:  stable, no recurrence of her present meds and continue same with a CCB, aspirin and a statin. Stress echo negative for ischemia at 10 mets 08/23/13  . C. difficile colitis 06/10/2017  . 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 Essential hypertension 08/16/2015  . Hyperlipidemia 08/16/2015  . Hypertension   . Migraine 06/10/2017  . 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 Osteoporosis   . PAT (paroxysmal atrial tachycardia) ([place]) 08/16/2015    Past Surgical History:  Procedure  Laterality Date  . ABDOMINAL HYSTERECTOMY    . BUNIONECTOMY     reverse bunionectomy  . CARDIAC CATHETERIZATION    . HAMMER TOE SURGERY      Current Medications: Current Meds  Medication Sig  . alendronate (FOSAMAX) 70 MG tablet Take 70 mg by mouth once a week.  Marland Kitchen aspirin EC 81 MG tablet Take 81 mg  by mouth.  Marland Kitchen atorvastatin (LIPITOR) 10 MG tablet TAKE ONE TABLET 3 times weekly  . Calcium Carb-Ergocalciferol 500-200 MG-UNIT TABS Take by mouth.  . cloNIDine (CATAPRES) 0.1 MG tablet Take 0.1 mg by mouth as needed.   . Multiple Vitamin (MULTI-VITAMINS) TABS Take by mouth.  . nitroGLYCERIN (NITROSTAT) 0.4 MG SL tablet Place 1 tablet (0.4 mg total) under the tongue every 5 (five) minutes as needed for chest pain.  . polyethylene glycol powder (GLYCOLAX/MIRALAX) powder MIX 17GMS (1 CAPFUL) IN 8OZ OF LIQUID AND DRINK ONCE DAILY  . psyllium (METAMUCIL) 58.6 % packet Take 1 packet by mouth daily.  Marland Kitchen spironolactone-hydrochlorothiazide (ALDACTAZIDE) 25-25 MG per tablet Take 1 tablet by mouth 3 (three) times a week.   . verapamil (CALAN-SR) 180 MG CR tablet Take 1 tablet (180 mg total) by mouth 2 (two) times daily.  . Vitamins-Lipotropics (LIPOFLAVOVIT) TABS Take by mouth.  . [DISCONTINUED] dextromethorphan-guaiFENesin (MUCINEX DM) 30-600 MG 12hr tablet Take 1 tablet by mouth 2 (two) times daily as needed for cough.   Current Facility-Administered Medications for the 11/28/18 encounter (Office Visit) with Richardo Priest, MD  Medication  . dexamethasone (DECADRON) injection 4 mg  . triamcinolone acetonide (KENALOG) 10 MG/ML injection 10 mg     Allergies:   Codeine; Sulfa antibiotics; and Tape   Social History   Socioeconomic History  . Marital status: Married    Spouse name: Not on file  . Number of children: Not on file  . Years of education: Not on file  . Highest education level: Not on file  Occupational History  . Not on file  Social Needs  . Financial resource strain: Not on file  . Food insecurity:    Worry: Not on file    Inability: Not on file  . Transportation needs:    Medical: Not on file    Non-medical: Not on file  Tobacco Use  . Smoking status: Never Smoker  . Smokeless tobacco: Never Used  Substance and Sexual Activity  . Alcohol use: No    Alcohol/week: 0.0  standard drinks    Comment: occasional  . Drug use: No  . Sexual activity: Not on file  Lifestyle  . Physical activity:    Days per week: Not on file    Minutes per session: Not on file  . Stress: Not on file  Relationships  . Social connections:    Talks on phone: Not on file    Gets together: Not on file    Attends religious service: Not on file    Active member of club or organization: Not on file    Attends meetings of clubs or organizations: Not on file    Relationship status: Not on file  Other Topics Concern  . Not on file  Social History Narrative  . Not on file     Family History: The patient's family history includes Aneurysm in her father. ROS:   Please see the history of present illness.    All other systems reviewed and are negative.  EKGs/Labs/Other Studies Reviewed:    The following studies were reviewed today:  EKG:  EKG to [facility] 10/24/2018 independently reviewed the EKGs were sinus normal Recent Labs: 09/26/2018: ALT 16; NT-Pro BNP 216 10/19/2018: BUN 18; Creatinine, Ser 0.68; Hemoglobin 13.6; Platelets 261; Potassium 4.1; Sodium 137; TSH 4.120  Recent Lipid Panel    Component Value Date/Time   CHOL 213 (H) 09/26/2018 1604   TRIG 50 09/26/2018 1604   HDL 88 09/26/2018 1604   CHOLHDL 2.4 09/26/2018 1604   LDLCALC 115 (H) 09/26/2018 1604    Physical Exam:    VS:  BP 128/76   Pulse 78   Ht 5\' 4"  (1.626 m)   Wt 152 lb 3.2 oz (69 kg)   SpO2 98%   BMI 26.13 kg/m     Wt Readings from Last 3 Encounters:  11/28/18 152 lb 3.2 oz (69 kg)  09/26/18 153 lb 6 oz (69.6 kg)  04/20/18 154 lb (69.9 kg)     GEN:  Well nourished, well developed in no acute distress HEENT: Normal NECK: No JVD; No carotid bruits LYMPHATICS: No lymphadenopathy CARDIAC:  RRR, no murmurs, rubs, gallops RESPIRATORY:  Clear to auscultation without rales, wheezing or rhonchi  ABDOMEN: Soft, non-tender, non-distended MUSCULOSKELETAL:  No edema; No deformity  SKIN:  Warm and dry NEUROLOGIC:  Alert and oriented x 3 PSYCHIATRIC:  Normal affect    Signed, Shirlee More, MD  11/28/2018 5:29 PM    [facility]

## 2018-11-28 ENCOUNTER — Encounter: Payer: Self-pay | Admitting: Cardiology

## 2018-11-28 ENCOUNTER — Ambulatory Visit: Payer: Medicare Other | Admitting: Cardiology

## 2018-11-28 VITALS — BP 128/76 | HR 78 | Ht 64.0 in | Wt 152.2 lb

## 2018-11-28 DIAGNOSIS — K824 Cholesterolosis of gallbladder: Secondary | ICD-10-CM | POA: Insufficient documentation

## 2018-11-28 DIAGNOSIS — I471 Supraventricular tachycardia: Secondary | ICD-10-CM

## 2018-11-28 DIAGNOSIS — I201 Angina pectoris with documented spasm: Secondary | ICD-10-CM

## 2018-11-28 DIAGNOSIS — I10 Essential (primary) hypertension: Secondary | ICD-10-CM | POA: Diagnosis not present

## 2018-11-28 DIAGNOSIS — E785 Hyperlipidemia, unspecified: Secondary | ICD-10-CM

## 2018-11-28 NOTE — Patient Instructions (Signed)

## 2019-02-01 ENCOUNTER — Other Ambulatory Visit: Payer: Self-pay | Admitting: Cardiology

## 2019-02-01 ENCOUNTER — Other Ambulatory Visit: Payer: Self-pay | Admitting: *Deleted

## 2019-02-01 MED ORDER — VERAPAMIL HCL ER 180 MG PO TBCR: 180.0000 mg | EXTENDED_RELEASE_TABLET | Freq: Two times a day (BID) | ORAL | 2 refills | Status: DC

## 2019-04-17 DIAGNOSIS — M76829 Posterior tibial tendinitis, unspecified leg: Secondary | ICD-10-CM | POA: Insufficient documentation

## 2019-04-27 ENCOUNTER — Other Ambulatory Visit: Payer: Self-pay | Admitting: Cardiology

## 2019-06-24 NOTE — Progress Notes (Signed)
Cardiology Office Note:    Date:  06/26/2019   ID:  [name], DOB 1941-05-25, MRN [id number]  PCP:  Nicoletta Dress, MD  Cardiologist:  Shirlee More, MD    Referring MD: Nicoletta Dress, MD    ASSESSMENT:    1. Coronary artery spasm ([place])   2. Essential hypertension   3. Mixed hyperlipidemia   4. PAT (paroxysmal atrial tachycardia) ([facility])    PLAN:    In order of problems listed above:  1. Stable, continue medical treatment including aspirin statin and calcium channel blocker. 2. Stable BP at target on current medical regimen including diuretic clonidine will recheck renal function potassium and continue her current antihypertensive regimen 3. Stable although her calcium score was 0 she has benefited from and will continue lipid-lowering treatment low-dose atorvastatin recheck liver function lipid profile. 4. No clinical recurrence of atrial arrhythmia continue her calcium channel   Next appointment: 6 months   Medication Adjustments/Labs and Tests Ordered: Current medicines are reviewed at length with the patient today.  Concerns regarding medicines are outlined above.  No orders of the defined types were placed in this encounter.  No orders of the defined types were placed in this encounter.   Chief Complaint  Patient presents with  . Follow-up    coronary spasm    History of Present Illness:    Melissa Lozano is a 78 y.o. female with a hx of Coronary artery spasm, essential hypertension, hyperlipidemia and PAT  last seen 11/28/2018 virtual visit. Compliance with diet, lifestyle and medications: Yes  Overall she is done well except for biliary colic and in the interim is a cholecystectomy.  Her hypertension is controlled no rapid heart rhythm chest pain has had no anginal episodes.  Her last labs for lipids and liver were in January she has an upcoming appointment with her PCP and will recheck today at that time 09/26/2018 cholesterol 213 HDL 88 LDL  115. Past Medical History:  Diagnosis Date  . Angina pectoris ([place]) 08/16/2015   Overview:  stable, no recurrence of her present meds and continue same with a CCB, aspirin and a statin. Stress echo negative for ischemia at 10 mets 08/23/13  . C. difficile colitis 06/10/2017  . 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 Essential hypertension 08/16/2015  . Hyperlipidemia 08/16/2015  . Hypertension   . Migraine 06/10/2017  . 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 Osteoporosis   . PAT (paroxysmal atrial tachycardia) ([place]) 08/16/2015    Past Surgical History:  Procedure Laterality Date  . ABDOMINAL HYSTERECTOMY    . BUNIONECTOMY     reverse bunionectomy  . CARDIAC CATHETERIZATION    . CHOLECYSTECTOMY    . HAMMER TOE SURGERY      Current Medications: Current Meds  Medication Sig  . alendronate (FOSAMAX) 70 MG tablet Take 70 mg by mouth once a week.  Marland Kitchen aspirin EC 81 MG tablet Take 81 mg by mouth.  Marland Kitchen atorvastatin (LIPITOR) 10 MG tablet TAKE ONE TABLET 3 times weekly  . Calcium Carb-Ergocalciferol 500-200 MG-UNIT TABS Take 1 tablet by mouth 2 (two) times daily.   . cloNIDine (CATAPRES) 0.1 MG tablet Take 0.1 mg by mouth as needed.   . Multiple Vitamin (MULTI-VITAMINS) TABS Take 1 tablet by mouth daily.   . nitroGLYCERIN (NITROSTAT) 0.4 MG SL tablet Place 1 tablet (0.4 mg total) under the tongue every 5 (five) minutes as  needed for chest pain.  . polyethylene glycol powder (GLYCOLAX/MIRALAX) powder MIX 17GMS (1 CAPFUL) IN 8OZ OF LIQUID AND DRINK ONCE DAILY  . psyllium (METAMUCIL) 58.6 % packet Take 1 packet by mouth daily.  Marland Kitchen spironolactone-hydrochlorothiazide (ALDACTAZIDE) 25-25 MG per tablet Take 1 tablet by mouth 3 (three) times a week.   . verapamil (CALAN-SR) 180 MG CR tablet Take 1 tablet (180 mg total) by mouth 2 (two) times daily.  . Vitamins-Lipotropics (LIPOFLAVOVIT) TABS Take 1 tablet by mouth 2 (two)  times daily.    Current Facility-Administered Medications for the 06/26/19 encounter (Office Visit) with Richardo Priest, MD  Medication  . dexamethasone (DECADRON) injection 4 mg     Allergies:   Codeine, Sulfa antibiotics, and Tape   Social History   Socioeconomic History  . Marital status: Married    Spouse name: Not on file  . Number of children: Not on file  . Years of education: Not on file  . Highest education level: Not on file  Occupational History  . Not on file  Social Needs  . Financial resource strain: Not on file  . Food insecurity    Worry: Not on file    Inability: Not on file  . Transportation needs    Medical: Not on file    Non-medical: Not on file  Tobacco Use  . Smoking status: Never Smoker  . Smokeless tobacco: Never Used  Substance and Sexual Activity  . Alcohol use: Yes    Alcohol/week: 0.0 standard drinks    Comment: occasional  . Drug use: No  . Sexual activity: Not on file  Lifestyle  . Physical activity    Days per week: Not on file    Minutes per session: Not on file  . Stress: Not on file  Relationships  . Social Herbalist on phone: Not on file    Gets together: Not on file    Attends religious service: Not on file    Active member of club or organization: Not on file    Attends meetings of clubs or organizations: Not on file    Relationship status: Not on file  Other Topics Concern  . Not on file  Social History Narrative  . Not on file     Family History: The patient's family history includes Aneurysm in her father. ROS:   Please see the history of present illness.    All other systems reviewed and are negative.  EKGs/Labs/Other Studies Reviewed:    The following studies were reviewed today:  EKG:  EKG ordered today and personally reviewed.  The ekg ordered today demonstrates sinus rhythm and is normal  Recent Labs: 09/26/2018: ALT 16; NT-Pro BNP 216 10/19/2018: BUN 18; Creatinine, Ser 0.68; Hemoglobin 13.6;  Platelets 261; Potassium 4.1; Sodium 137; TSH 4.120  Recent Lipid Panel    Component Value Date/Time   CHOL 213 (H) 09/26/2018 1604   TRIG 50 09/26/2018 1604   HDL 88 09/26/2018 1604   CHOLHDL 2.4 09/26/2018 1604   LDLCALC 115 (H) 09/26/2018 1604    Physical Exam:    VS:  BP 124/78 (BP Location: Right Arm, Patient Position: Sitting, Cuff Size: Normal)   Pulse 75   Ht 5' 3.5" (1.613 m)   Wt 154 lb (69.9 kg)   SpO2 93%   BMI 26.85 kg/m     Wt Readings from Last 3 Encounters:  06/26/19 154 lb (69.9 kg)  11/28/18 152 lb 3.2 oz (69 kg)  09/26/18  153 lb 6 oz (69.6 kg)     GEN:  Well nourished, well developed in no acute distress HEENT: Normal NECK: No JVD; No carotid bruits LYMPHATICS: No lymphadenopathy CARDIAC: RRR, no murmurs, rubs, gallops RESPIRATORY:  Clear to auscultation without rales, wheezing or rhonchi  ABDOMEN: Soft, non-tender, non-distended MUSCULOSKELETAL:  No edema; No deformity  SKIN: Warm and dry NEUROLOGIC:  Alert and oriented x 3 PSYCHIATRIC:  Normal affect    Signed, Shirlee More, MD  06/26/2019 12:59 PM    [facility]

## 2019-06-26 ENCOUNTER — Ambulatory Visit (INDEPENDENT_AMBULATORY_CARE_PROVIDER_SITE_OTHER): Payer: Medicare Other | Admitting: Cardiology

## 2019-06-26 ENCOUNTER — Encounter: Payer: Self-pay | Admitting: Cardiology

## 2019-06-26 ENCOUNTER — Other Ambulatory Visit: Payer: Self-pay

## 2019-06-26 VITALS — BP 124/78 | HR 75 | Ht 63.5 in | Wt 154.0 lb

## 2019-06-26 DIAGNOSIS — E782 Mixed hyperlipidemia: Secondary | ICD-10-CM

## 2019-06-26 DIAGNOSIS — I201 Angina pectoris with documented spasm: Secondary | ICD-10-CM | POA: Diagnosis not present

## 2019-06-26 DIAGNOSIS — I471 Supraventricular tachycardia: Secondary | ICD-10-CM

## 2019-06-26 DIAGNOSIS — I10 Essential (primary) hypertension: Secondary | ICD-10-CM

## 2019-06-26 NOTE — Patient Instructions (Signed)
Medication Instructions:  Your physician recommends that you continue on your current medications as directed. Please refer to the Current Medication list given to you today.  If you need a refill on your cardiac medications before your next appointment, please call your pharmacy.   Lab work: You will have lab work today:  CMP, Lipid If you have labs (blood work) drawn today and your tests are completely normal, you will receive your results only by: Marland Kitchen MyChart Message (if you have MyChart) OR . A paper copy in the mail If you have any lab test that is abnormal or we need to change your treatment, we will call you to review the results.  Testing/Procedures: You had an EKG today.  Follow-Up: At [facility], you and your health needs are our priority.  As part of our continuing mission to provide you with exceptional heart care, we have created designated Provider Care Teams.  These Care Teams include your primary Cardiologist (physician) and Advanced Practice Providers (APPs -  Physician Assistants and Nurse Practitioners) who all work together to provide you with the care you need, when you need it. You will need a follow up appointment in 6 months.  Please call our office 2 months in advance to schedule this appointment.

## 2019-06-27 LAB — COMPREHENSIVE METABOLIC PANEL
ALT: 20 IU/L (ref 0–32)
AST: 21 IU/L (ref 0–40)
Albumin/Globulin Ratio: 2.2 (ref 1.2–2.2)
Albumin: 4.2 g/dL (ref 3.7–4.7)
Alkaline Phosphatase: 79 IU/L (ref 39–117)
BUN/Creatinine Ratio: 21 (ref 12–28)
BUN: 17 mg/dL (ref 8–27)
Bilirubin Total: 0.3 mg/dL (ref 0.0–1.2)
CO2: 27 mmol/L (ref 20–29)
Calcium: 9.9 mg/dL (ref 8.7–10.3)
Chloride: 103 mmol/L (ref 96–106)
Creatinine, Ser: 0.81 mg/dL (ref 0.57–1.00)
GFR calc Af Amer: 80 mL/min/{1.73_m2} (ref >59)
GFR calc non Af Amer: 69 mL/min/{1.73_m2} (ref >59)
Globulin, Total: 1.9 g/dL (ref 1.5–4.5)
Glucose: 109 mg/dL — ABNORMAL HIGH (ref 65–99)
Potassium: 4.3 mmol/L (ref 3.5–5.2)
Sodium: 143 mmol/L (ref 134–144)
Total Protein: 6.1 g/dL (ref 6.0–8.5)

## 2019-06-27 LAB — LIPID PANEL
Chol/HDL Ratio: 2.5 ratio (ref 0.0–4.4)
Cholesterol, Total: 184 mg/dL (ref 100–199)
HDL: 73 mg/dL (ref >39)
LDL Chol Calc (NIH): 97 mg/dL (ref 0–99)
Triglycerides: 78 mg/dL (ref 0–149)
VLDL Cholesterol Cal: 14 mg/dL (ref 5–40)

## 2019-10-11 ENCOUNTER — Other Ambulatory Visit: Payer: Self-pay | Admitting: Cardiology

## 2019-11-08 DIAGNOSIS — L820 Inflamed seborrheic keratosis: Secondary | ICD-10-CM | POA: Diagnosis not present

## 2019-11-08 DIAGNOSIS — D1801 Hemangioma of skin and subcutaneous tissue: Secondary | ICD-10-CM | POA: Diagnosis not present

## 2019-11-08 DIAGNOSIS — L578 Other skin changes due to chronic exposure to nonionizing radiation: Secondary | ICD-10-CM | POA: Diagnosis not present

## 2019-11-15 DIAGNOSIS — M1712 Unilateral primary osteoarthritis, left knee: Secondary | ICD-10-CM | POA: Diagnosis not present

## 2019-12-20 DIAGNOSIS — M25562 Pain in left knee: Secondary | ICD-10-CM | POA: Diagnosis not present

## 2019-12-27 DIAGNOSIS — Z1211 Encounter for screening for malignant neoplasm of colon: Secondary | ICD-10-CM | POA: Diagnosis not present

## 2019-12-27 DIAGNOSIS — K5730 Diverticulosis of large intestine without perforation or abscess without bleeding: Secondary | ICD-10-CM | POA: Diagnosis not present

## 2019-12-27 DIAGNOSIS — K635 Polyp of colon: Secondary | ICD-10-CM | POA: Diagnosis not present

## 2019-12-27 DIAGNOSIS — D124 Benign neoplasm of descending colon: Secondary | ICD-10-CM | POA: Diagnosis not present

## 2019-12-27 DIAGNOSIS — Z86010 Personal history of colonic polyps: Secondary | ICD-10-CM | POA: Diagnosis not present

## 2020-01-07 NOTE — Progress Notes (Signed)
Cardiology Office Note:    Date:  01/08/2020   ID:  Melissa Lozano, DOB Feb 15, 1941, MRN [id number]  PCP:  Melissa Dress, MD  Cardiologist:  Melissa More, MD    Referring MD: Melissa Dress, MD    ASSESSMENT:    1. 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 Essential hypertension   3. Mixed hyperlipidemia   4. PAT (paroxysmal atrial tachycardia) (HCC)    PLAN:    In order of problems listed above:  1. Stable she has had no recurrent angina continue her calcium channel blocker.  Nitroglycerin and aspirin and statin with her previous myocardial infarction with normal coronary arteries.  She does not need an ischemia evaluation 2. Stable previously quite resistant paroxysmal blood pressure medicine range continue her current regimen including centrally acting clonidine calcium channel blocker and ARB along with diuretic check renal function potassium 3. Continue statin lipids are ideal her last LDL was less than 100 4. Stable no recurrence of tachycardia   Next appointment: 6 months   Medication Adjustments/Labs and Tests Ordered: Current medicines are reviewed at length with the patient today.  Concerns regarding medicines are outlined above.  No orders of the defined types were placed in this encounter.  No orders of the defined types were placed in this encounter.   Chief Complaint  Patient presents with  . Follow-up    Coronary artery spasm    History of Present Illness:    Melissa Lozano is a 79 y.o. female with a hx of coronary artery spasm hypertension hyperlipidemia and paroxysmal atrial tachycardia last seen 06/26/2019. Compliance with diet, lifestyle and medications: Yes  Melissa Lozano was quite troubled by joint knee ankle pain is using topical Voltaren has had quick relief.  She is back to her usual walking program pleased with the quality of her life and she has had no further angina dyspnea shortness of breath palpitation or syncope.  She is overdue for labs today we  will check CMP and lipid profile.  Her last labs from 06/26/2019 shows a cholesterol 184 LDL 115 HDL 73 and normal renal function.  I am not can repeat an EKG today she has had no chest pain. Past Medical History:  Diagnosis Date  . Angina pectoris ([place]) 08/16/2015   Overview:  stable, no recurrence of her present meds and continue same with a CCB, aspirin and a statin. Stress echo negative for ischemia at 10 mets 08/23/13  . C. difficile colitis 06/10/2017  . 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 Essential hypertension 08/16/2015  . Hyperlipidemia 08/16/2015  . Hypertension   . Migraine 06/10/2017  . 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 Osteoporosis   . PAT (paroxysmal atrial tachycardia) ([place]) 08/16/2015    Past Surgical History:  Procedure Laterality Date  . ABDOMINAL HYSTERECTOMY    . BUNIONECTOMY     reverse bunionectomy  . CARDIAC CATHETERIZATION    . CHOLECYSTECTOMY    . HAMMER TOE SURGERY      Current Medications: Current Meds  Medication Sig  . alendronate (FOSAMAX) 70 MG tablet Take 70 mg by mouth once a week.  Marland Kitchen aspirin EC 81 MG tablet Take 81 mg by mouth.  Marland Kitchen atorvastatin (LIPITOR) 10 MG tablet TAKE ONE TABLET 3 times weekly  . Calcium Carb-Ergocalciferol 500-200 MG-UNIT TABS Take 1 tablet by mouth 2 (two) times daily.   . cloNIDine (CATAPRES) 0.1 MG tablet  Take 0.1 mg by mouth as needed.   . diclofenac Sodium (VOLTAREN) 1 % GEL Apply topically daily.  . Multiple Vitamin (MULTI-VITAMINS) TABS Take 1 tablet by mouth daily.   . nitroGLYCERIN (NITROSTAT) 0.4 MG SL tablet Place 1 tablet (0.4 mg total) under the tongue every 5 (five) minutes as needed for chest pain.  . polyethylene glycol powder (GLYCOLAX/MIRALAX) powder MIX 17GMS (1 CAPFUL) IN 8OZ OF LIQUID AND DRINK ONCE DAILY  . psyllium (METAMUCIL) 58.6 % packet Take 1 packet by mouth daily.  Marland Kitchen spironolactone-hydrochlorothiazide  (ALDACTAZIDE) 25-25 MG per tablet Take 1 tablet by mouth 3 (three) times a week.   . verapamil (CALAN-SR) 180 MG CR tablet Take 1 tablet (180 mg total) by mouth 2 (two) times daily.  . Vitamins-Lipotropics (LIPOFLAVOVIT) TABS Take 1 tablet by mouth 2 (two) times daily.    Current Facility-Administered Medications for the 01/08/20 encounter (Office Visit) with Richardo Priest, MD  Medication  . dexamethasone (DECADRON) injection 4 mg     Allergies:   Codeine, Sulfa antibiotics, and Tape   Social History   Socioeconomic History  . Marital status: Married    Spouse name: Not on file  . Number of children: Not on file  . Years of education: Not on file  . Highest education level: Not on file  Occupational History  . Not on file  Tobacco Use  . Smoking status: Never Smoker  . Smokeless tobacco: Never Used  Substance and Sexual Activity  . Alcohol use: Yes    Alcohol/week: 0.0 standard drinks    Comment: occasional  . Drug use: No  . Sexual activity: Not on file  Other Topics Concern  . Not on file  Social History Narrative  . Not on file   Social Determinants of Health   Financial Resource Strain:   . Difficulty of Paying Living Expenses:   Food Insecurity:   . Worried About Charity fundraiser in the Last Year:   . Arboriculturist in the Last Year:   Transportation Needs:   . Film/video editor (Medical):   Marland Kitchen Lack of Transportation (Non-Medical):   Physical Activity:   . Days of Exercise per Week:   . Minutes of Exercise per Session:   Stress:   . Feeling of Stress :   Social Connections:   . Frequency of Communication with Friends and Family:   . Frequency of Social Gatherings with Friends and Family:   . Attends Religious Services:   . Active Member of Clubs or Organizations:   . Attends Archivist Meetings:   Marland Kitchen Marital Status:      Family History: The patient's family history includes Aneurysm in her father. ROS:   Please see the history of  present illness.    All other systems reviewed and are negative.  EKGs/Labs/Other Studies Reviewed:    The following studies were reviewed today:   Recent Labs: 06/26/2019: ALT 20; BUN 17; Creatinine, Ser 0.81; Potassium 4.3; Sodium 143  Recent Lipid Panel    Component Value Date/Time   CHOL 184 06/26/2019 1312   TRIG 78 06/26/2019 1312   HDL 73 06/26/2019 1312   CHOLHDL 2.5 06/26/2019 1312   LDLCALC 97 06/26/2019 1312    Physical Exam:    VS:  BP 128/66   Pulse 72   Temp 98.8 F (37.1 C)   Ht 5\' 3"  (1.6 m)   Wt 153 lb 6.4 oz (69.6 kg)   SpO2 95%  BMI 27.17 kg/m     Wt Readings from Last 3 Encounters:  01/08/20 153 lb 6.4 oz (69.6 kg)  06/26/19 154 lb (69.9 kg)  11/28/18 152 lb 3.2 oz (69 kg)     GEN:  Well nourished, well developed in no acute distress HEENT: Normal NECK: No JVD; No carotid bruits LYMPHATICS: No lymphadenopathy CARDIAC: RRR, no murmurs, rubs, gallops RESPIRATORY:  Clear to auscultation without rales, wheezing or rhonchi  ABDOMEN: Soft, non-tender, non-distended MUSCULOSKELETAL:  No edema; No deformity  SKIN: Warm and dry NEUROLOGIC:  Alert and oriented x 3 PSYCHIATRIC:  Normal affect    Signed, Melissa More, MD  01/08/2020 1:42 PM    [facility]

## 2020-01-08 ENCOUNTER — Other Ambulatory Visit: Payer: Self-pay

## 2020-01-08 ENCOUNTER — Ambulatory Visit: Payer: Medicare PPO | Admitting: Cardiology

## 2020-01-08 ENCOUNTER — Encounter: Payer: Self-pay | Admitting: Cardiology

## 2020-01-08 VITALS — BP 128/66 | HR 72 | Temp 98.8°F | Ht 63.0 in | Wt 153.4 lb

## 2020-01-08 DIAGNOSIS — I10 Essential (primary) hypertension: Secondary | ICD-10-CM

## 2020-01-08 DIAGNOSIS — I201 Angina pectoris with documented spasm: Secondary | ICD-10-CM | POA: Diagnosis not present

## 2020-01-08 DIAGNOSIS — I471 Supraventricular tachycardia: Secondary | ICD-10-CM

## 2020-01-08 DIAGNOSIS — E782 Mixed hyperlipidemia: Secondary | ICD-10-CM | POA: Diagnosis not present

## 2020-01-08 MED ORDER — NITROGLYCERIN 0.4 MG SL SUBL: 0.4000 mg | SUBLINGUAL_TABLET | SUBLINGUAL | 11 refills | Status: AC | PRN

## 2020-01-08 NOTE — Patient Instructions (Signed)
Medication Instructions:  Your physician recommends that you continue on your current medications as directed. Please refer to the Current Medication list given to you today.  *If you need a refill on your cardiac medications before your next appointment, please call your pharmacy*   Lab Work: Your physician recommends that you return for lab work in: [place], Lipid profile If you have labs (blood work) drawn today and your tests are completely normal, you will receive your results only by: Marland Kitchen MyChart Message (if you have MyChart) OR . A paper copy in the mail If you have any lab test that is abnormal or we need to change your treatment, we will call you to review the results.   Testing/Procedures: None   Follow-Up: At [facility], you and your health needs are our priority.  As part of our continuing mission to provide you with exceptional heart care, we have created designated Provider Care Teams.  These Care Teams include your primary Cardiologist (physician) and Advanced Practice Providers (APPs -  Physician Assistants and Nurse Practitioners) who all work together to provide you with the care you need, when you need it.  We recommend signing up for the patient portal called "MyChart".  Sign up information is provided on this After Visit Summary.  MyChart is used to connect with patients for Virtual Visits (Telemedicine).  Patients are able to view lab/test results, encounter notes, upcoming appointments, etc.  Non-urgent messages can be sent to your provider as well.   To learn more about what you can do with MyChart, go to [email or web address].    Your next appointment:   6 month(s)  The format for your next appointment:   In Person  Provider:   Shirlee More, MD   Other Instructions

## 2020-01-09 ENCOUNTER — Telehealth: Payer: Self-pay

## 2020-01-09 LAB — COMPREHENSIVE METABOLIC PANEL
ALT: 16 IU/L (ref 0–32)
AST: 21 IU/L (ref 0–40)
Albumin/Globulin Ratio: 2.1 (ref 1.2–2.2)
Albumin: 4.1 g/dL (ref 3.7–4.7)
Alkaline Phosphatase: 62 IU/L (ref 39–117)
BUN/Creatinine Ratio: 25 (ref 12–28)
BUN: 18 mg/dL (ref 8–27)
Bilirubin Total: 0.2 mg/dL (ref 0.0–1.2)
CO2: 25 mmol/L (ref 20–29)
Calcium: 9.1 mg/dL (ref 8.7–10.3)
Chloride: 100 mmol/L (ref 96–106)
Creatinine, Ser: 0.71 mg/dL (ref 0.57–1.00)
GFR calc Af Amer: 93 mL/min/{1.73_m2} (ref >59)
GFR calc non Af Amer: 81 mL/min/{1.73_m2} (ref >59)
Globulin, Total: 2 g/dL (ref 1.5–4.5)
Glucose: 90 mg/dL (ref 65–99)
Potassium: 4.3 mmol/L (ref 3.5–5.2)
Sodium: 138 mmol/L (ref 134–144)
Total Protein: 6.1 g/dL (ref 6.0–8.5)

## 2020-01-09 LAB — LIPID PANEL
Chol/HDL Ratio: 2.2 ratio (ref 0.0–4.4)
Cholesterol, Total: 176 mg/dL (ref 100–199)
HDL: 81 mg/dL (ref >39)
LDL Chol Calc (NIH): 82 mg/dL (ref 0–99)
Triglycerides: 71 mg/dL (ref 0–149)
VLDL Cholesterol Cal: 13 mg/dL (ref 5–40)

## 2020-01-09 NOTE — Telephone Encounter (Signed)
-----   Message from Richardo Priest, MD sent at 01/09/2020 12:08 PM EDT ----- Normal or stable result  All are good no changes

## 2020-01-09 NOTE — Telephone Encounter (Signed)
Spoke with patient regarding results.  Patient verbalizes understanding and is agreeable to plan of care. Advised patient to call back with any issues or concerns.  

## 2020-01-31 DIAGNOSIS — L578 Other skin changes due to chronic exposure to nonionizing radiation: Secondary | ICD-10-CM | POA: Diagnosis not present

## 2020-01-31 DIAGNOSIS — D1801 Hemangioma of skin and subcutaneous tissue: Secondary | ICD-10-CM | POA: Diagnosis not present

## 2020-01-31 DIAGNOSIS — L570 Actinic keratosis: Secondary | ICD-10-CM | POA: Diagnosis not present

## 2020-01-31 DIAGNOSIS — D225 Melanocytic nevi of trunk: Secondary | ICD-10-CM | POA: Diagnosis not present

## 2020-01-31 DIAGNOSIS — D2239 Melanocytic nevi of other parts of face: Secondary | ICD-10-CM | POA: Diagnosis not present

## 2020-03-27 ENCOUNTER — Other Ambulatory Visit: Payer: Self-pay | Admitting: Cardiology

## 2020-04-06 DIAGNOSIS — T63481A Toxic effect of venom of other arthropod, accidental (unintentional), initial encounter: Secondary | ICD-10-CM | POA: Diagnosis not present

## 2020-05-08 DIAGNOSIS — L820 Inflamed seborrheic keratosis: Secondary | ICD-10-CM | POA: Diagnosis not present

## 2020-05-29 DIAGNOSIS — M1990 Unspecified osteoarthritis, unspecified site: Secondary | ICD-10-CM | POA: Diagnosis not present

## 2020-05-29 DIAGNOSIS — Z91048 Other nonmedicinal substance allergy status: Secondary | ICD-10-CM | POA: Diagnosis not present

## 2020-05-29 DIAGNOSIS — R32 Unspecified urinary incontinence: Secondary | ICD-10-CM | POA: Diagnosis not present

## 2020-05-29 DIAGNOSIS — Z818 Family history of other mental and behavioral disorders: Secondary | ICD-10-CM | POA: Diagnosis not present

## 2020-05-29 DIAGNOSIS — Z85828 Personal history of other malignant neoplasm of skin: Secondary | ICD-10-CM | POA: Diagnosis not present

## 2020-05-29 DIAGNOSIS — Z885 Allergy status to narcotic agent status: Secondary | ICD-10-CM | POA: Diagnosis not present

## 2020-05-29 DIAGNOSIS — K5900 Constipation, unspecified: Secondary | ICD-10-CM | POA: Diagnosis not present

## 2020-05-29 DIAGNOSIS — I10 Essential (primary) hypertension: Secondary | ICD-10-CM | POA: Diagnosis not present

## 2020-05-29 DIAGNOSIS — Z9181 History of falling: Secondary | ICD-10-CM | POA: Diagnosis not present

## 2020-05-29 DIAGNOSIS — Z8249 Family history of ischemic heart disease and other diseases of the circulatory system: Secondary | ICD-10-CM | POA: Diagnosis not present

## 2020-05-29 DIAGNOSIS — M810 Age-related osteoporosis without current pathological fracture: Secondary | ICD-10-CM | POA: Diagnosis not present

## 2020-05-29 DIAGNOSIS — K581 Irritable bowel syndrome with constipation: Secondary | ICD-10-CM | POA: Diagnosis not present

## 2020-05-29 DIAGNOSIS — I252 Old myocardial infarction: Secondary | ICD-10-CM | POA: Diagnosis not present

## 2020-05-29 DIAGNOSIS — G8929 Other chronic pain: Secondary | ICD-10-CM | POA: Diagnosis not present

## 2020-05-29 DIAGNOSIS — I2510 Atherosclerotic heart disease of native coronary artery without angina pectoris: Secondary | ICD-10-CM | POA: Diagnosis not present

## 2020-06-04 DIAGNOSIS — H2000 Unspecified acute and subacute iridocyclitis: Secondary | ICD-10-CM | POA: Diagnosis not present

## 2020-06-20 DIAGNOSIS — H52203 Unspecified astigmatism, bilateral: Secondary | ICD-10-CM | POA: Diagnosis not present

## 2020-06-20 DIAGNOSIS — H25813 Combined forms of age-related cataract, bilateral: Secondary | ICD-10-CM | POA: Diagnosis not present

## 2020-06-20 DIAGNOSIS — H5203 Hypermetropia, bilateral: Secondary | ICD-10-CM | POA: Diagnosis not present

## 2020-07-02 DIAGNOSIS — I10 Essential (primary) hypertension: Secondary | ICD-10-CM | POA: Insufficient documentation

## 2020-07-03 NOTE — Progress Notes (Signed)
Cardiology Office Note:    Date:  07/04/2020   ID:  Melissa Lozano, DOB 05-25-1941, MRN [id number]  PCP:  Nicoletta Dress, MD  Cardiologist:  Shirlee More, MD    Referring MD: Nicoletta Dress, MD    ASSESSMENT:    1. 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 Essential hypertension   3. Mixed hyperlipidemia   4. PAT (paroxysmal atrial tachycardia) (HCC)    PLAN:    In order of problems listed above:  1. She continues to do well with medical therapy for coronary spasm no recurrent angina continue her calcium channel blocker aspirin statin New [place] Heart Association class I 2. BP at target continue current regimen including her diuretic as needed clonidine calcium channel blocker 3. Lipids are ideal continue high intensity statin labs are followed in her PCP office 4. Stable no recurrence continue her calcium channel blocker 5. She has a prescription for nitroglycerin has not been used 6. She has a prescription for clonidine for hypertension paroxysm not needed   Next appointment: 6 months   Medication Adjustments/Labs and Tests Ordered: Current medicines are reviewed at length with the patient today.  Concerns regarding medicines are outlined above.  Orders Placed This Encounter  Procedures  . EKG 12-Lead   No orders of the defined types were placed in this encounter.   Chief Complaint  Patient presents with  . Follow-up    For coronary artery spasm SVT  . Hypertension  . Hyperlipidemia    History of Present Illness:    Melissa Lozano is a 79 y.o. female with a hx of coronary artery spasm hypertension hyperlipidemia and paroxysmal atrial tachycardia last seen 01/08/2020. Compliance with diet, lifestyle and medications: Yes  She has done well recovering from the loss of her husband active in the community has had no recurrent rapid heart rhythm shortness of breath chest pain palpitation or syncope.  Recent labs at her PCP office 01/08/2020 showed lipids at target  cholesterol 176 LDL 82 triglyceride 71 HDL 81 creatinine normal 0.71 A1c 5.5% EKG in my office today shows sinus rhythm and is normal Past Medical History:  Diagnosis Date  . 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 Stress echo negative for ischemia at 10 mets 08/23/13  . C. difficile colitis 06/10/2017  . 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 Essential hypertension 08/16/2015  . Hyperlipidemia 08/16/2015  . Hypertension   . Migraine 06/10/2017  . 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 Osteoporosis   . PAT (paroxysmal atrial tachycardia) ([place]) 08/16/2015    Past Surgical History:  Procedure Laterality Date  . ABDOMINAL HYSTERECTOMY    . BUNIONECTOMY     reverse bunionectomy  . CARDIAC CATHETERIZATION    . CHOLECYSTECTOMY    . HAMMER TOE SURGERY      Current Medications: Current Meds  Medication Sig  . alendronate (FOSAMAX) 70 MG tablet Take 70 mg by mouth once a week.  Marland Kitchen aspirin EC 81 MG tablet Take 81 mg by mouth.  Marland Kitchen atorvastatin (LIPITOR) 10 MG tablet TAKE ONE TABLET 3 times weekly  . calcium-vitamin D (OSCAL WITH D) 500-200 MG-UNIT TABS tablet Take 1 tablet by mouth 2 (two) times daily.  . cloNIDine (CATAPRES) 0.1 MG tablet Take 0.1 mg by mouth as needed.   . diclofenac  Sodium (VOLTAREN) 1 % GEL Apply topically daily.  . Multiple Vitamin (MULTI-VITAMINS) TABS Take 1 tablet by mouth daily.   . Multiple Vitamins-Minerals (MULTIPLE VITAMINS/WOMENS PO) Take 1 tablet by mouth daily.  . nitroGLYCERIN (NITROSTAT) 0.4 MG SL tablet Place 1 tablet (0.4 mg total) under the tongue every 5 (five) minutes as needed for chest pain.  . Omega-3 Fatty Acids (FISH OIL) 1000 MG CAPS Take 1 capsule by mouth daily.  . polyethylene glycol powder (GLYCOLAX/MIRALAX) powder MIX 17GMS (1 CAPFUL) IN 8OZ OF LIQUID AND DRINK ONCE  DAILY  . psyllium (METAMUCIL) 58.6 % packet Take 1 packet by mouth daily.  Marland Kitchen spironolactone-hydrochlorothiazide (ALDACTAZIDE) 25-25 MG per tablet Take 1 tablet by mouth 3 (three) times a week.   . verapamil (CALAN-SR) 180 MG CR tablet TAKE ONE TABLET BY MOUTH TWICE DAILY   Current Facility-Administered Medications for the 07/04/20 encounter (Office Visit) with Richardo Priest, MD  Medication  . dexamethasone (DECADRON) injection 4 mg     Allergies:   Codeine, Sulfa antibiotics, and Tape   Social History   Socioeconomic History  . Marital status: Married    Spouse name: Not on file  . Number of children: Not on file  . Years of education: Not on file  . Highest education level: Not on file  Occupational History  . Not on file  Tobacco Use  . Smoking status: Never Smoker  . Smokeless tobacco: Never Used  Vaping Use  . Vaping Use: Never used  Substance and Sexual Activity  . Alcohol use: Yes    Alcohol/week: 0.0 standard drinks    Comment: occasional  . Drug use: No  . Sexual activity: Not on file  Other Topics Concern  . Not on file  Social History Narrative  . Not on file   Social Determinants of Health   Financial Resource Strain:   . Difficulty of Paying Living Expenses: Not on file  Food Insecurity:   . Worried About Charity fundraiser in the Last Year: Not on file  . Ran Out of Food in the Last Year: Not on file  Transportation Needs:   . Lack of Transportation (Medical): Not on file  . Lack of Transportation (Non-Medical): Not on file  Physical Activity:   . Days of Exercise per Week: Not on file  . Minutes of Exercise per Session: Not on file  Stress:   . Feeling of Stress : Not on file  Social Connections:   . Frequency of Communication with Friends and Family: Not on file  . Frequency of Social Gatherings with Friends and Family: Not on file  . Attends Religious Services: Not on file  . Active Member of Clubs or Organizations: Not on file  . Attends  Archivist Meetings: Not on file  . Marital Status: Not on file     Family History: The patient's family history includes Aneurysm in her father. ROS:   Please see the history of present illness.    All other systems reviewed and are negative.  EKGs/Labs/Other Studies Reviewed:    The following studies were reviewed today:  EKG:  EKG ordered today and personally reviewed.  The ekg ordered today demonstrates sinus rhythm normal  Recent Labs: 01/08/2020: ALT 16; BUN 18; Creatinine, Ser 0.71; Potassium 4.3; Sodium 138  Recent Lipid Panel    Component Value Date/Time   CHOL 176 01/08/2020 1350   TRIG 71 01/08/2020 1350   HDL 81 01/08/2020 1350   CHOLHDL 2.2  01/08/2020 1350   LDLCALC 82 01/08/2020 1350    Physical Exam:    VS:  BP 134/78   Pulse 68   Ht 5\' 3"  (1.6 m)   Wt 155 lb (70.3 kg)   SpO2 98%   BMI 27.46 kg/m     Wt Readings from Last 3 Encounters:  07/04/20 155 lb (70.3 kg)  01/08/20 153 lb 6.4 oz (69.6 kg)  06/26/19 154 lb (69.9 kg)     GEN:  Well nourished, well developed in no acute distress HEENT: Normal NECK: No JVD; No carotid bruits LYMPHATICS: No lymphadenopathy CARDIAC: RRR, no murmurs, rubs, gallops RESPIRATORY:  Clear to auscultation without rales, wheezing or rhonchi  ABDOMEN: Soft, non-tender, non-distended MUSCULOSKELETAL:  No edema; No deformity  SKIN: Warm and dry NEUROLOGIC:  Alert and oriented x 3 PSYCHIATRIC:  Normal affect    Signed, Shirlee More, MD  07/04/2020 10:13 AM    [employer]

## 2020-07-04 ENCOUNTER — Encounter: Payer: Self-pay | Admitting: Cardiology

## 2020-07-04 ENCOUNTER — Ambulatory Visit: Payer: Medicare PPO | Admitting: Cardiology

## 2020-07-04 ENCOUNTER — Other Ambulatory Visit: Payer: Self-pay

## 2020-07-04 VITALS — BP 134/78 | HR 68 | Ht 63.0 in | Wt 155.0 lb

## 2020-07-04 DIAGNOSIS — I4719 Other supraventricular tachycardia: Secondary | ICD-10-CM

## 2020-07-04 DIAGNOSIS — I471 Supraventricular tachycardia: Secondary | ICD-10-CM | POA: Diagnosis not present

## 2020-07-04 DIAGNOSIS — I201 Angina pectoris with documented spasm: Secondary | ICD-10-CM

## 2020-07-04 DIAGNOSIS — I10 Essential (primary) hypertension: Secondary | ICD-10-CM

## 2020-07-04 DIAGNOSIS — E782 Mixed hyperlipidemia: Secondary | ICD-10-CM | POA: Diagnosis not present

## 2020-07-04 NOTE — Patient Instructions (Signed)

## 2020-07-22 DIAGNOSIS — H6122 Impacted cerumen, left ear: Secondary | ICD-10-CM | POA: Diagnosis not present

## 2020-07-22 DIAGNOSIS — H61303 Acquired stenosis of external ear canal, unspecified, bilateral: Secondary | ICD-10-CM | POA: Diagnosis not present

## 2020-07-22 DIAGNOSIS — L299 Pruritus, unspecified: Secondary | ICD-10-CM | POA: Diagnosis not present

## 2020-08-20 DIAGNOSIS — S92302A Fracture of unspecified metatarsal bone(s), left foot, initial encounter for closed fracture: Secondary | ICD-10-CM | POA: Diagnosis not present

## 2020-10-01 DIAGNOSIS — Z1331 Encounter for screening for depression: Secondary | ICD-10-CM | POA: Diagnosis not present

## 2020-10-01 DIAGNOSIS — Z0000 Encounter for general adult medical examination without abnormal findings: Secondary | ICD-10-CM | POA: Diagnosis not present

## 2020-10-01 DIAGNOSIS — Z9181 History of falling: Secondary | ICD-10-CM | POA: Diagnosis not present

## 2020-10-01 DIAGNOSIS — Z139 Encounter for screening, unspecified: Secondary | ICD-10-CM | POA: Diagnosis not present

## 2020-10-01 DIAGNOSIS — E785 Hyperlipidemia, unspecified: Secondary | ICD-10-CM | POA: Diagnosis not present

## 2020-10-14 DIAGNOSIS — I10 Essential (primary) hypertension: Secondary | ICD-10-CM | POA: Diagnosis not present

## 2020-10-14 DIAGNOSIS — H9193 Unspecified hearing loss, bilateral: Secondary | ICD-10-CM | POA: Diagnosis not present

## 2020-10-14 DIAGNOSIS — E785 Hyperlipidemia, unspecified: Secondary | ICD-10-CM | POA: Diagnosis not present

## 2020-10-14 DIAGNOSIS — I471 Supraventricular tachycardia: Secondary | ICD-10-CM | POA: Diagnosis not present

## 2020-10-14 DIAGNOSIS — M810 Age-related osteoporosis without current pathological fracture: Secondary | ICD-10-CM | POA: Diagnosis not present

## 2020-10-15 DIAGNOSIS — R319 Hematuria, unspecified: Secondary | ICD-10-CM | POA: Diagnosis not present

## 2020-10-16 DIAGNOSIS — Z9049 Acquired absence of other specified parts of digestive tract: Secondary | ICD-10-CM | POA: Diagnosis not present

## 2020-10-16 DIAGNOSIS — I700 Atherosclerosis of aorta: Secondary | ICD-10-CM | POA: Diagnosis not present

## 2020-10-16 DIAGNOSIS — K5730 Diverticulosis of large intestine without perforation or abscess without bleeding: Secondary | ICD-10-CM | POA: Diagnosis not present

## 2020-10-16 DIAGNOSIS — R319 Hematuria, unspecified: Secondary | ICD-10-CM | POA: Diagnosis not present

## 2020-10-29 DIAGNOSIS — N952 Postmenopausal atrophic vaginitis: Secondary | ICD-10-CM | POA: Diagnosis not present

## 2020-10-29 DIAGNOSIS — R310 Gross hematuria: Secondary | ICD-10-CM | POA: Diagnosis not present

## 2020-10-29 DIAGNOSIS — N3946 Mixed incontinence: Secondary | ICD-10-CM | POA: Diagnosis not present

## 2020-10-29 DIAGNOSIS — Z79899 Other long term (current) drug therapy: Secondary | ICD-10-CM | POA: Diagnosis not present

## 2020-11-01 DIAGNOSIS — M810 Age-related osteoporosis without current pathological fracture: Secondary | ICD-10-CM | POA: Diagnosis not present

## 2020-11-01 DIAGNOSIS — Z1231 Encounter for screening mammogram for malignant neoplasm of breast: Secondary | ICD-10-CM | POA: Diagnosis not present

## 2020-11-01 DIAGNOSIS — N959 Unspecified menopausal and perimenopausal disorder: Secondary | ICD-10-CM | POA: Diagnosis not present

## 2020-11-04 DIAGNOSIS — K5730 Diverticulosis of large intestine without perforation or abscess without bleeding: Secondary | ICD-10-CM | POA: Diagnosis not present

## 2020-11-04 DIAGNOSIS — R310 Gross hematuria: Secondary | ICD-10-CM | POA: Diagnosis not present

## 2020-11-04 DIAGNOSIS — K429 Umbilical hernia without obstruction or gangrene: Secondary | ICD-10-CM | POA: Diagnosis not present

## 2020-11-04 DIAGNOSIS — N3289 Other specified disorders of bladder: Secondary | ICD-10-CM | POA: Diagnosis not present

## 2020-11-04 DIAGNOSIS — I700 Atherosclerosis of aorta: Secondary | ICD-10-CM | POA: Diagnosis not present

## 2020-11-04 DIAGNOSIS — K449 Diaphragmatic hernia without obstruction or gangrene: Secondary | ICD-10-CM | POA: Diagnosis not present

## 2020-11-04 DIAGNOSIS — R93421 Abnormal radiologic findings on diagnostic imaging of right kidney: Secondary | ICD-10-CM | POA: Diagnosis not present

## 2020-11-11 DIAGNOSIS — M1711 Unilateral primary osteoarthritis, right knee: Secondary | ICD-10-CM | POA: Diagnosis not present

## 2020-11-12 DIAGNOSIS — R310 Gross hematuria: Secondary | ICD-10-CM | POA: Diagnosis not present

## 2020-11-12 DIAGNOSIS — N3281 Overactive bladder: Secondary | ICD-10-CM | POA: Diagnosis not present

## 2020-11-12 DIAGNOSIS — N952 Postmenopausal atrophic vaginitis: Secondary | ICD-10-CM | POA: Diagnosis not present

## 2020-11-21 DIAGNOSIS — M810 Age-related osteoporosis without current pathological fracture: Secondary | ICD-10-CM | POA: Diagnosis not present

## 2020-11-28 DIAGNOSIS — H9113 Presbycusis, bilateral: Secondary | ICD-10-CM | POA: Diagnosis not present

## 2020-11-28 DIAGNOSIS — H903 Sensorineural hearing loss, bilateral: Secondary | ICD-10-CM | POA: Diagnosis not present

## 2020-12-04 ENCOUNTER — Other Ambulatory Visit: Payer: Self-pay | Admitting: Cardiology

## 2020-12-04 DIAGNOSIS — L03031 Cellulitis of right toe: Secondary | ICD-10-CM | POA: Diagnosis not present

## 2020-12-04 NOTE — Telephone Encounter (Signed)
Verapamil approved and sent

## 2020-12-06 DIAGNOSIS — H52203 Unspecified astigmatism, bilateral: Secondary | ICD-10-CM | POA: Diagnosis not present

## 2020-12-06 DIAGNOSIS — H18513 Endothelial corneal dystrophy, bilateral: Secondary | ICD-10-CM | POA: Diagnosis not present

## 2020-12-06 DIAGNOSIS — H353131 Nonexudative age-related macular degeneration, bilateral, early dry stage: Secondary | ICD-10-CM | POA: Diagnosis not present

## 2020-12-06 DIAGNOSIS — H33102 Unspecified retinoschisis, left eye: Secondary | ICD-10-CM | POA: Diagnosis not present

## 2020-12-17 ENCOUNTER — Encounter: Payer: Self-pay | Admitting: Sports Medicine

## 2020-12-17 ENCOUNTER — Ambulatory Visit: Payer: Medicare Other | Admitting: Sports Medicine

## 2020-12-17 ENCOUNTER — Other Ambulatory Visit: Payer: Self-pay

## 2020-12-17 DIAGNOSIS — M2060 Acquired deformities of toe(s), unspecified, unspecified foot: Secondary | ICD-10-CM

## 2020-12-17 DIAGNOSIS — L600 Ingrowing nail: Secondary | ICD-10-CM

## 2020-12-17 DIAGNOSIS — M79674 Pain in right toe(s): Secondary | ICD-10-CM | POA: Diagnosis not present

## 2020-12-17 MED ORDER — NEOMYCIN-POLYMYXIN-HC 3.5-10000-1 OT SOLN
OTIC | 0 refills | Status: DC
Start: 2020-12-17 — End: 2021-02-07

## 2020-12-17 NOTE — Patient Instructions (Signed)

## 2020-12-17 NOTE — Progress Notes (Signed)
Subjective: Melissa Lozano is a 80 y.o. female patient presents to office today complaining of a moderately painful incurvated, red and tender right 3rd toenail at the lateral border. Patient has treated this by antibiotics given by PCP. Pain started after a pedicure on Feb 7th and has slowly gotten worse.  Patient denies fever/chills/nausea/vomitting/any other related constitutional symptoms at this time.  Patient Active Problem List   Diagnosis Date Noted  . 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 Hypertension   . 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 Gallbladder polyp 11/28/2018  . C. difficile colitis 06/10/2017  . Migraine 06/10/2017  . Osteoporosis 06/10/2017  . Old MI (myocardial infarction) 06/10/2017  . Angina pectoris ([place]) 08/16/2015  . 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 Essential hypertension 08/16/2015  . Hyperlipidemia 08/16/2015  . PAT (paroxysmal atrial tachycardia) ([place]) 08/16/2015    Current Outpatient Medications on File Prior to Visit  Medication Sig Dispense Refill  . aspirin EC 81 MG tablet Take 81 mg by mouth.    Marland Kitchen atorvastatin (LIPITOR) 10 MG tablet TAKE ONE TABLET 3 times weekly 36 tablet 1  . calcium-vitamin D (OSCAL WITH D) 500-200 MG-UNIT TABS tablet Take 1 tablet by mouth 2 (two) times daily.    . cloNIDine (CATAPRES) 0.1 MG tablet Take 0.1 mg by mouth as needed.     . diclofenac Sodium (VOLTAREN) 1 % GEL Apply topically daily.    . Multiple Vitamin (MULTI-VITAMINS) TABS Take 1 tablet by mouth daily.     . Multiple Vitamins-Minerals (MULTIPLE VITAMINS/WOMENS PO) Take 1 tablet by mouth daily.    . nitroGLYCERIN (NITROSTAT) 0.4 MG SL tablet Place 1 tablet (0.4 mg total) under the tongue every 5 (five) minutes as needed for chest pain. 25 tablet 11  . Omega-3 Fatty Acids (FISH OIL) 1000 MG CAPS Take 1 capsule by mouth daily.    . polyethylene glycol powder (GLYCOLAX/MIRALAX) powder MIX 17GMS (1 CAPFUL) IN 8OZ OF LIQUID AND DRINK ONCE DAILY    .  psyllium (METAMUCIL) 58.6 % packet Take 1 packet by mouth daily.    Marland Kitchen spironolactone-hydrochlorothiazide (ALDACTAZIDE) 25-25 MG per tablet Take 1 tablet by mouth 3 (three) times a week.   3  . verapamil (CALAN-SR) 180 MG CR tablet TAKE ONE TABLET BY MOUTH TWICE DAILY 180 tablet 2   Current Facility-Administered Medications on File Prior to Visit  Medication Dose Route Frequency Provider Last Rate Last Admin  . dexamethasone (DECADRON) injection 4 mg  4 mg Intra-articular Once Landis Martins, DPM        Allergies  Allergen Reactions  . Sulfa Antibiotics Nausea Only and Nausea And Vomiting    Other reaction(s): Vomiting Other reaction(s): Vomiting   . Codeine Itching  . Tape Itching  . Wound Dressing Adhesive Itching    Objective:  There were no vitals filed for this visit.  General: Well developed, nourished, in no acute distress, alert and oriented x3   Dermatology: Skin is warm, dry and supple bilateral.  Right third toenail appears to be moderately incurvated with hyperkeratosis formation at the distal aspects of the lateral nail border. (+) Erythema. (+) Edema. (-) serosanguous  drainage present. The remaining nails appear unremarkable at this time. There are no open sores, lesions or other signs of infection present.  Vascular: Dorsalis Pedis artery and Posterior Tibial artery pedal pulses are 1/4 bilateral with immedate capillary fill time.  Varicosities present.  Scant pedal hair growth present. No significant lower extremity edema.   Neruologic: Grossly  intact via light touch bilateral.  Musculoskeletal: Tenderness palpation to right third toe at the lateral nail margin.  There is significant digital deformities previous history of foot surgery in 2019.  Assesement and Plan: Problem List Items Addressed This Visit   None   Visit Diagnoses    Ingrown nail    -  Primary   Toe pain, right       Acquired deformity of toe, unspecified laterality          -Discussed  treatment alternatives and plan of care; Explained permanent/temporary nail avulsion and post procedure course to patient. Patient elects for partial nail avulsion right third toe lateral margin - After a verbal and written consent, injected 3 ml of a 50:50 mixture of 2% plain  lidocaine and 0.5% plain marcaine in a normal digital block fashion. Next, a  betadine prep was performed. Anesthesia was tested and found to be appropriate.  The offending right third toe lateral nail border was then incised from the hyponychium to the epinychium. The offending nail border was removed and cleared from the field. The area was curretted for any remaining nail or spicules. Phenol application performed and the area was then flushed with alcohol and dressed with antibiotic cream and a dry sterile dressing. -Patient was instructed to leave the dressing intact for today and begin soaking  in a weak solution of betadine or Epsom salt and water tomorrow. Patient was instructed to soak for 15-20 minutes each day and apply neosporin/corticosporin and a gauze or bandaid dressing each day. -Patient was instructed to monitor the toe for signs of infection and return to office if toe becomes red, hot or swollen. -Advised ice, elevation, and tylenol  if needed for pain.  -Patient is to return in 2 weeks for follow up care/nail check or sooner if problems arise.  Landis Martins, DPM

## 2020-12-24 ENCOUNTER — Other Ambulatory Visit: Payer: Self-pay

## 2020-12-24 ENCOUNTER — Encounter (INDEPENDENT_AMBULATORY_CARE_PROVIDER_SITE_OTHER): Payer: Self-pay | Admitting: Ophthalmology

## 2020-12-24 ENCOUNTER — Encounter (INDEPENDENT_AMBULATORY_CARE_PROVIDER_SITE_OTHER): Payer: Self-pay

## 2020-12-24 ENCOUNTER — Encounter (INDEPENDENT_AMBULATORY_CARE_PROVIDER_SITE_OTHER): Payer: Medicare Other | Admitting: Ophthalmology

## 2020-12-24 ENCOUNTER — Ambulatory Visit (INDEPENDENT_AMBULATORY_CARE_PROVIDER_SITE_OTHER): Payer: Medicare PPO | Admitting: Ophthalmology

## 2020-12-24 DIAGNOSIS — I10 Essential (primary) hypertension: Secondary | ICD-10-CM

## 2020-12-24 DIAGNOSIS — H33102 Unspecified retinoschisis, left eye: Secondary | ICD-10-CM

## 2020-12-24 DIAGNOSIS — H3581 Retinal edema: Secondary | ICD-10-CM

## 2020-12-24 DIAGNOSIS — H35033 Hypertensive retinopathy, bilateral: Secondary | ICD-10-CM | POA: Diagnosis not present

## 2020-12-24 DIAGNOSIS — H25813 Combined forms of age-related cataract, bilateral: Secondary | ICD-10-CM | POA: Diagnosis not present

## 2020-12-24 DIAGNOSIS — H3322 Serous retinal detachment, left eye: Secondary | ICD-10-CM

## 2020-12-24 HISTORY — PX: EYE SURGERY: SHX253

## 2020-12-24 HISTORY — PX: RETINAL DETACHMENT SURGERY: SHX105

## 2020-12-24 MED ORDER — NEOMYCIN-POLYMYXIN-DEXAMETH 3.5-10000-0.1 OP OINT: 1.0000 "application " | TOPICAL_OINTMENT | Freq: Four times a day (QID) | OPHTHALMIC | 0 refills | Status: DC

## 2020-12-24 NOTE — Progress Notes (Signed)
[facility] Note  12/24/2020     CHIEF COMPLAINT Patient presents for Retina Evaluation   HISTORY OF PRESENT ILLNESS: Melissa Lozano is a 80 y.o. female who presents to the clinic today for:   HPI    Retina Evaluation    In left eye.  Onset: unknown.  Duration: unknown.  Associated Symptoms Floaters.  Negative for Flashes, Distortion, Blind Spot, Pain, Redness, Photophobia, Glare, Trauma, Scalp Tenderness, Jaw Claudication, Shoulder/Hip pain, Fever, Weight Loss and Fatigue.  Context:  distance vision and near vision.  Treatments tried include no treatments.  I, the attending physician,  performed the HPI with the patient and updated documentation appropriately.          Comments    Patient referred by Dr. Zigmund Daniel for possible RD OS. Patient was initially referred to our office to see Dr. Zigmund Daniel for bullous retinoschisis with retinal hole OS by Dr. Ellie Lunch. Patient has had floaters for years. No recent increase in floaters. Patient denies flashes.        Last edited by Bernarda Caffey, MD on 12/24/2020  4:43 PM. (History)    pt was here on the referral of Dr. 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 Ellie Lunch did sx she saw a spot in the back of her eye that she wanted checked out first, pt saw Dr. Zigmund Daniel first and was sent to Dr. Coralyn Pear for possible RD repair  Referring physician: Luberta Mutter, MD [place],  [place] [address]  HISTORICAL INFORMATION:   Selected notes from the MEDICAL RECORD NUMBER Referred by Dr. Luberta Mutter for concern of RD OS LEE:  Ocular Hx- PMH-    CURRENT MEDICATIONS: Current Outpatient Medications (Ophthalmic Drugs)  Medication Sig  . neomycin-polymyxin b-dexamethasone (MAXITROL) 3.5-10000-0.1 OINT Place 1 application into the left eye 4 (four) times daily.   No current facility-administered medications for this visit. (Ophthalmic Drugs)   Current  Outpatient Medications (Other)  Medication Sig  . aspirin EC 81 MG tablet Take 81 mg by mouth.  Marland Kitchen atorvastatin (LIPITOR) 10 MG tablet TAKE ONE TABLET 3 times weekly  . calcium-vitamin D (OSCAL WITH D) 500-200 MG-UNIT TABS tablet Take 1 tablet by mouth 2 (two) times daily.  . cloNIDine (CATAPRES) 0.1 MG tablet Take 0.1 mg by mouth as needed.   . diclofenac Sodium (VOLTAREN) 1 % GEL Apply topically daily.  . Multiple Vitamin (MULTI-VITAMINS) TABS Take 1 tablet by mouth daily.   . Multiple Vitamins-Minerals (MULTIPLE VITAMINS/WOMENS PO) Take 1 tablet by mouth daily.  Marland Kitchen neomycin-polymyxin-hydrocortisone (CORTISPORIN) OTIC solution Apply 2-3 drops to the ingrown toenail site twice daily. Cover with band-aid.  Marland Kitchen nitroGLYCERIN (NITROSTAT) 0.4 MG SL tablet Place 1 tablet (0.4 mg total) under the tongue every 5 (five) minutes as needed for chest pain.  . Omega-3 Fatty Acids (FISH OIL) 1000 MG CAPS Take 1 capsule by mouth daily.  . polyethylene glycol powder (GLYCOLAX/MIRALAX) powder MIX 17GMS (1 CAPFUL) IN 8OZ OF LIQUID AND DRINK ONCE DAILY  . psyllium (METAMUCIL) 58.6 % packet Take 1 packet by mouth daily.  Marland Kitchen spironolactone-hydrochlorothiazide (ALDACTAZIDE) 25-25 MG per tablet Take 1 tablet by mouth 3 (three) times a week.   . verapamil (CALAN-SR) 180 MG CR tablet TAKE ONE TABLET BY MOUTH TWICE DAILY   Current Facility-Administered Medications (Other)  Medication Route  . dexamethasone (DECADRON) injection 4 mg Intra-articular      REVIEW OF SYSTEMS: ROS  Positive for: Cardiovascular, Eyes   Negative for: Constitutional, Gastrointestinal, Neurological, Skin, Genitourinary, Musculoskeletal, HENT, Endocrine, Respiratory, Psychiatric, Allergic/Imm, Heme/Lymph   Last edited by Roselee Nova D, COT on 12/24/2020  2:19 PM. (History)       ALLERGIES Allergies  Allergen Reactions  . Sulfa Antibiotics Nausea Only and Nausea And Vomiting    Other reaction(s): Vomiting Other reaction(s):  Vomiting   . Codeine Itching  . Tape Itching  . Wound Dressing Adhesive Itching    PAST MEDICAL HISTORY Past Medical History:  Diagnosis Date  . 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 Stress echo negative for ischemia at 10 mets 08/23/13  . C. difficile colitis 06/10/2017  . Coronary artery spasm ([place]) 08/16/2015  . Essential hypertension 08/16/2015  . Hyperlipidemia 08/16/2015  . Hypertension   . Migraine 06/10/2017  . 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 Osteoporosis   . PAT (paroxysmal atrial tachycardia) ([place]) 08/16/2015   Past Surgical History:  Procedure Laterality Date  . ABDOMINAL HYSTERECTOMY    . BUNIONECTOMY     reverse bunionectomy  . CARDIAC CATHETERIZATION    . CHOLECYSTECTOMY    . HAMMER TOE SURGERY      FAMILY HISTORY Family History  Problem Relation Age of Onset  . Aneurysm Father   . Diabetes Maternal Grandmother     SOCIAL HISTORY Social History   Tobacco Use  . Smoking status: Never Smoker  . Smokeless tobacco: Never Used  Vaping Use  . Vaping Use: Never used  Substance Use Topics  . Alcohol use: Yes    Alcohol/week: 0.0 standard drinks    Comment: occasional  . Drug use: No         OPHTHALMIC EXAM:  Base Eye Exam    Visual Acuity (Snellen - Linear)      Right Left   Dist cc 20/40 -2 20/40 -2   Dist ph cc NI NI   Correction: Glasses       Tonometry (Tonopen, 2:21 PM)      Right Left   Pressure 11 10       Pupils      Dark Light Shape React APD   Right 8 8 Round None None   Left 8 8 Round None None       Visual Fields (Counting fingers)      Left Right    Full Full       Extraocular Movement      Right Left    Full, Ortho Full, Ortho  RHT       Neuro/Psych    Oriented x3: Yes   Mood/Affect: Normal       Dilation    Both eyes:  1.0% Mydriacyl, 2.5% Phenylephrine @ 1:01 PM        Slit Lamp and Fundus Exam    Slit Lamp Exam      Right Left   Lids/Lashes Dermatochalasis - upper lid Dermatochalasis - upper lid   Conjunctiva/Sclera White and quiet White and quiet   Cornea arcus arcus   Anterior Chamber Deep and quiet Deep and quiet   Iris Round and dilated Round and dilated   Lens 3+ Nuclear sclerosis, 3+ Cortical cataract 3+ Nuclear sclerosis, 3+ Cortical cataract   Vitreous Vitreous syneresis, no pigment, Posterior vitreous detachment Vitreous syneresis, trace pigment, Posterior  vitreous detachment, vitreous condensations       Fundus Exam      Right Left   Disc Pink and Sharp, Compact Pink and Sharp, Compact   C/D Ratio 0.1 0.1   Macula Flat, Blunted foveal reflex, mild Drusen, RPE mottling and clumping, No heme or edema Flat, Good foveal reflex, RPE mottling and clumping, mild Drusen   Vessels mild attenuation, mild tortuousity attenuated, mild tortuousity   Periphery Attached; no RT/RD or heme schisis detachment from 0130-0400 with retinal hole at 0200; pigment deposition along posterior border of detachment - likely chronic        Refraction    Wearing Rx      Sphere Cylinder Axis Add Vert Prism   Right +0.50 +0.75 005 +2.75 2 BD   Left +0.25 +0.75 155 +2.75 2 BU       Manifest Refraction      Sphere Cylinder Axis Dist VA   Right +0.50 +0.75 005 20/30-2   Left -0.25 +0.75 155 20/30          IMAGING AND PROCEDURES  Imaging and Procedures for 12/24/2020  OCT, Retina - OU - Both Eyes       Right Eye Quality was good. Central Foveal Thickness: 303. Progression has no prior data. Findings include normal foveal contour, no IRF, no SRF, retinal drusen .   Left Eye Quality was good. Central Foveal Thickness: 311. Progression has no prior data. Findings include normal foveal contour, epiretinal membrane, intraretinal fluid, subretinal fluid (Schisis detachment ST quad caught on widefield).    Notes *Images captured and stored on drive  Diagnosis / Impression:  OD: NFP, no IRF/SRF OS: NFP, no IRF/SRF centrally; Schisis detachment ST quad caught on widefield  Clinical management:  See below  Abbreviations: NFP - Normal foveal profile. CME - cystoid macular edema. PED - pigment epithelial detachment. IRF - intraretinal fluid. SRF - subretinal fluid. EZ - ellipsoid zone. ERM - epiretinal membrane. ORA - outer retinal atrophy. ORT - outer retinal tubulation. SRHM - subretinal hyper-reflective material. IRHM - intraretinal hyper-reflective material        Pneumatic Retinopexy - OS - Left Eye       PROCEDURE NOTE  Diagnosis:  Retinal detachment with retinal tear, LEFT EYE  Procedure:  Pneumatic cryopexy with C3F8 gas injection, LEFT EYE  CPT:  44010  Surgeon: Bernarda Caffey, M.D.,Ph.D.  Anesthesia:  Subconjunctival Lidocaine   The patient was brought to the procedure room. Informed consent was obtained for cryopexy of tear and intravitreal gas injection. The diagnosis was reviewed with the patient and all questions were answered. The risks of the procedure including potential systemic risks like stroke and heart attack and other thrombotic events as well as the local risks like infection, endophthalmitis, retinal detachment were discussed. The patient consented to the procedure.   The LEFT eye was marked and a time out was performed identifying the correct eye. Subsequently, the patient was placed in the supine position. Topical anesthetic drops were given and a lid speculum was placed to expose the eye. Subsequently, 2% Lidocaine was injected subconjunctivally in the quadrants of the tears giving adequate anesthesia. Using indirect ophthalmoscopy the cryo probe was positioned beneath the tear at 0200 and choroidal and retinal whitening was achieved 360 degrees around the tear margins. Additional cryo was applied to the posterior bed of the detachment. The superotemporal  quadrant was then cleaned with Betadine swabs and allowed to dry. At this time, 3.5 mm posterior to the limbus  utilizing a 30-gauge needle, 0.45 cc of pure, 100% C3F8 gas was injected into the vitreous cavity under direct visualization. The needle was then withdrawn from the eye and the retina and gas bubble were visualized. An AC tap was performed to normalize the pressure and the central retinal artery was noted to be patent. Betadine was then reapplied to the injection sites and then rinsed with sterile BSS. A drop of polymixin ophthalmic soln and polysporin opthalmic ung were applied to the eye, then the eye was double patched with eye pads. An arrow was drawn on the dressing to assist with post-operative positioning. There were no complications. Discharge and post-operative instructions were reviewed.               ASSESSMENT/PLAN:    ICD-10-CM   1. 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 Retinal edema  H35.81 OCT, Retina - OU - Both Eyes  4. Essential hypertension  I10   5. Hypertensive retinopathy of both eyes  H35.033   6. 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 Retinoschisis retinal detachment, OS - focal, peripheral schisis detachment from 0130-0330 - retinal hole at 0200 and +pigment along posterior border -- likely chronic - asymptomatic - The incidence, risk factors, and natural history of retinal detachment was discussed with patient.   - Potential treatment options including delimiting laser, pneumatic retinopexy, scleral buckle, and vitrectomy, cryotherapy and laser, and the use of air, gas, and oil discussed with patient. - The risks of blindness, loss of vision, infection, hemorrhage, cataract progression or lens displacement were discussed with patient. - recommend pneumatic retinopexy OS today, 04.05.22 - pt wishes to proceed with procedure - RBA of procedure discussed,  questions answered - informed consent obtained and signed - see procedure note - discussed post procedure positioning -- face down, R head tilt - remove patch after dinner and start Maxitrol ung QID OS this evening - f/u tomorrow -- POV OS  4,5. Hypertensive retinopathy OU - discussed importance of tight BP control - monitor  6. Mixed Cataract OU - The symptoms of cataract, surgical options, and treatments and risks were discussed with patient. - discussed diagnosis and progression - under the expert management of Dr. Ellie Lunch    Ophthalmic Meds Ordered this visit:  Meds ordered this encounter  Medications  . neomycin-polymyxin b-dexamethasone (MAXITROL) 3.5-10000-0.1 OINT    Sig: Place 1 application into the left eye 4 (four) times daily.    Dispense:  3 g    Refill:  0       Return in about 1 day (around 12/25/2020) for f/u retinal detachment OS, DFE, OCT.  There are no Patient Instructions on file for this visit.   Explained the diagnoses, plan, and follow up with the patient and they expressed understanding.  Patient expressed understanding of the importance of proper follow up care.   This document serves as a record of services personally performed by Gardiner Sleeper, MD, PhD. It was created on their behalf by San Jetty. Owens Shark, OA an ophthalmic technician. The creation of this record is the provider's dictation and/or activities during the visit.    Electronically signed by: San Jetty. [place], [place] 04.05.2022 5:02 PM  Gardiner Sleeper, M.D., Ph.D. Diseases & Surgery of the Retina and Vitreous [facility]  I have reviewed the above documentation for accuracy and completeness,  and I agree with the above. Gardiner Sleeper, M.D., Ph.D. 12/24/20 5:02 PM  Abbreviations: M myopia (nearsighted); A astigmatism; H hyperopia (farsighted); P presbyopia; Mrx spectacle prescription;  CTL contact lenses; OD right eye; OS left eye; OU both eyes  XT exotropia; ET  esotropia; PEK punctate epithelial keratitis; PEE punctate epithelial erosions; DES dry eye syndrome; MGD meibomian gland dysfunction; ATs artificial tears; PFAT's preservative free artificial tears; [place] nuclear sclerotic cataract; PSC posterior subcapsular cataract; ERM epi-retinal membrane; PVD posterior vitreous detachment; RD retinal detachment; DM diabetes mellitus; DR diabetic retinopathy; NPDR non-proliferative diabetic retinopathy; PDR proliferative diabetic retinopathy; CSME clinically significant macular edema; DME diabetic macular edema; dbh dot blot hemorrhages; CWS cotton wool spot; POAG primary open angle glaucoma; C/D cup-to-disc ratio; HVF humphrey visual field; GVF goldmann visual field; OCT optical coherence tomography; IOP intraocular pressure; BRVO Branch retinal vein occlusion; CRVO central retinal vein occlusion; CRAO central retinal artery occlusion; BRAO branch retinal artery occlusion; RT retinal tear; SB scleral buckle; PPV pars plana vitrectomy; VH Vitreous hemorrhage; PRP panretinal laser photocoagulation; IVK intravitreal kenalog; VMT vitreomacular traction; MH Macular hole;  NVD neovascularization of the disc; NVE neovascularization elsewhere; AREDS age related eye disease study; ARMD age related macular degeneration; POAG primary open angle glaucoma; EBMD epithelial/anterior basement membrane dystrophy; ACIOL anterior chamber intraocular lens; IOL intraocular lens; PCIOL posterior chamber intraocular lens; Phaco/IOL phacoemulsification with intraocular lens placement; [place] photorefractive keratectomy; LASIK laser assisted in situ keratomileusis; HTN hypertension; DM diabetes mellitus; COPD chronic obstructive pulmonary disease

## 2020-12-25 ENCOUNTER — Encounter (INDEPENDENT_AMBULATORY_CARE_PROVIDER_SITE_OTHER): Payer: Self-pay | Admitting: Ophthalmology

## 2020-12-25 ENCOUNTER — Ambulatory Visit (INDEPENDENT_AMBULATORY_CARE_PROVIDER_SITE_OTHER): Payer: Medicare PPO | Admitting: Ophthalmology

## 2020-12-25 DIAGNOSIS — I10 Essential (primary) hypertension: Secondary | ICD-10-CM

## 2020-12-25 DIAGNOSIS — H3322 Serous retinal detachment, left eye: Secondary | ICD-10-CM

## 2020-12-25 DIAGNOSIS — H33102 Unspecified retinoschisis, left eye: Secondary | ICD-10-CM

## 2020-12-25 DIAGNOSIS — H35033 Hypertensive retinopathy, bilateral: Secondary | ICD-10-CM

## 2020-12-25 DIAGNOSIS — H25813 Combined forms of age-related cataract, bilateral: Secondary | ICD-10-CM

## 2020-12-25 DIAGNOSIS — H3581 Retinal edema: Secondary | ICD-10-CM

## 2020-12-25 NOTE — Progress Notes (Signed)
[facility] Note  12/25/2020     CHIEF COMPLAINT Patient presents for Post-op Follow-up   HISTORY OF PRESENT ILLNESS: Melissa Lozano is a 80 y.o. female who presents to the clinic today for:   HPI    Post-op Follow-up    In left eye.  Discomfort includes foreign body sensation and tearing.  Vision is worse.  I, the attending physician,  performed the HPI with the patient and updated documentation appropriately.          Comments    Pt states vision is poor in the left eye.  States she did not sleep well last night with head positioning.  Pt denies eye pain.  She states she does have some irritation/FBS OS.       Last edited by Bernarda Caffey, MD on 12/25/2020  1:13 PM. (History)    pt states she did not sleep well last night, she states her left eye vision is blurry and she is having a hard time reading with her right eye, she has to adjust her glasses to find a good focal point  Referring physician: Nicoletta Dress, MD [place],  [place] [address]  HISTORICAL INFORMATION:   Selected notes from the [facility] Referred by Dr. Luberta Mutter for concern of RD OS LEE:  Ocular Hx- PMH-    CURRENT MEDICATIONS: Current Outpatient Medications (Ophthalmic Drugs)  Medication Sig  . neomycin-polymyxin b-dexamethasone (MAXITROL) 3.5-10000-0.1 OINT Place 1 application into the left eye 4 (four) times daily.   No current facility-administered medications for this visit. (Ophthalmic Drugs)   Current Outpatient Medications (Other)  Medication Sig  . denosumab (PROLIA) 60 MG/ML SOSY injection Inject 60 mg into the skin every 6 (six) months.  Marland Kitchen aspirin EC 81 MG tablet Take 81 mg by mouth.  Marland Kitchen atorvastatin (LIPITOR) 10 MG tablet TAKE ONE TABLET 3 times weekly  . calcium-vitamin D (OSCAL WITH D) 500-200 MG-UNIT TABS tablet Take 1 tablet by mouth 2 (two) times daily.  . cloNIDine (CATAPRES) 0.1 MG tablet Take 0.1 mg by  mouth as needed.   . diclofenac Sodium (VOLTAREN) 1 % GEL Apply topically daily.  . Multiple Vitamin (MULTI-VITAMINS) TABS Take 1 tablet by mouth daily.   . Multiple Vitamins-Minerals (MULTIPLE VITAMINS/WOMENS PO) Take 1 tablet by mouth daily.  Marland Kitchen neomycin-polymyxin-hydrocortisone (CORTISPORIN) OTIC solution Apply 2-3 drops to the ingrown toenail site twice daily. Cover with band-aid.  Marland Kitchen nitroGLYCERIN (NITROSTAT) 0.4 MG SL tablet Place 1 tablet (0.4 mg total) under the tongue every 5 (five) minutes as needed for chest pain.  . Omega-3 Fatty Acids (FISH OIL) 1000 MG CAPS Take 1 capsule by mouth daily.  . polyethylene glycol powder (GLYCOLAX/MIRALAX) powder MIX 17GMS (1 CAPFUL) IN 8OZ OF LIQUID AND DRINK ONCE DAILY  . psyllium (METAMUCIL) 58.6 % packet Take 1 packet by mouth daily.  Marland Kitchen spironolactone-hydrochlorothiazide (ALDACTAZIDE) 25-25 MG per tablet Take 1 tablet by mouth 3 (three) times a week.   . verapamil (CALAN-SR) 180 MG CR tablet TAKE ONE TABLET BY MOUTH TWICE DAILY   Current Facility-Administered Medications (Other)  Medication Route  . dexamethasone (DECADRON) injection 4 mg Intra-articular      REVIEW OF SYSTEMS: ROS    Positive for: Cardiovascular, Eyes   Negative for: Constitutional, Gastrointestinal, Neurological, Skin, Genitourinary, Musculoskeletal, HENT, Endocrine, Respiratory, Psychiatric, Allergic/Imm, Heme/Lymph   Last edited by Doneen Poisson on 12/25/2020 10:23 AM. (History)  ALLERGIES Allergies  Allergen Reactions  . Sulfa Antibiotics Nausea Only and Nausea And Vomiting    Other reaction(s): Vomiting Other reaction(s): Vomiting   . Codeine Itching  . Tape Itching  . Wound Dressing Adhesive Itching    PAST MEDICAL HISTORY Past Medical History:  Diagnosis Date  . Angina pectoris ([place]) 08/16/2015   Overview:  stable, no recurrence of her present meds and continue same with a CCB, aspirin and a statin. Stress echo negative for ischemia at 10 mets  08/23/13  . C. difficile colitis 06/10/2017  . Coronary artery spasm ([place]) 08/16/2015  . Essential hypertension 08/16/2015  . Hyperlipidemia 08/16/2015  . Hypertension   . Migraine 06/10/2017  . 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 Osteoporosis   . PAT (paroxysmal atrial tachycardia) ([place]) 08/16/2015   Past Surgical History:  Procedure Laterality Date  . ABDOMINAL HYSTERECTOMY    . BUNIONECTOMY     reverse bunionectomy  . CARDIAC CATHETERIZATION    . CHOLECYSTECTOMY    . HAMMER TOE SURGERY      FAMILY HISTORY Family History  Problem Relation Age of Onset  . Aneurysm Father   . Diabetes Maternal Grandmother     SOCIAL HISTORY Social History   Tobacco Use  . Smoking status: Never Smoker  . Smokeless tobacco: Never Used  Vaping Use  . Vaping Use: Never used  Substance Use Topics  . Alcohol use: Yes    Alcohol/week: 0.0 standard drinks    Comment: occasional  . Drug use: No         OPHTHALMIC EXAM:  Base Eye Exam    Visual Acuity (Snellen - Linear)      Right Left   Dist cc 20/30 -2 20/150 +1   Dist ph cc NI NI       Tonometry (Tonopen, 10:24 AM)      Right Left   Pressure def 15       Pupils      Dark Light Shape React APD   Right 3 2 Round Brisk 0   Left 4 3 Round Brisk 0       Extraocular Movement      Right Left    Full Full       Neuro/Psych    Oriented x3: Yes   Mood/Affect: Normal       Dilation    Left eye: 1.0% Mydriacyl, 2.5% Phenylephrine @ 10:24 AM        Slit Lamp and Fundus Exam    External Exam      Right Left   External  periorbital erythema nasal       Slit Lamp Exam      Right Left   Lids/Lashes Dermatochalasis - upper lid Dermatochalasis - upper lid, periorbital errythema nasal   Conjunctiva/Sclera White and quiet Subconjunctival hemorrhage, Chemosis   Cornea arcus arcus   Anterior Chamber Deep and quiet Deep and quiet   Iris Round and  dilated Round and dilated   Lens 3+ Nuclear sclerosis, 3+ Cortical cataract 3+ Nuclear sclerosis, 3+ Cortical cataract   Vitreous Vitreous syneresis, no pigment, Posterior vitreous detachment Vitreous syneresis, trace pigment, Posterior vitreous detachment, vitreous condensations       Fundus Exam      Right Left   Disc Pink and Sharp, Compact Pink and Sharp, Compact   C/D Ratio 0.1 0.1   Macula Flat, Blunted foveal reflex, mild Drusen, RPE  mottling and clumping, No heme or edema Flat, Good foveal reflex, RPE mottling and clumping, mild Drusen   Vessels mild attenuation, mild tortuousity attenuated, mild tortuousity   Periphery Attached; no RT/RD or heme ST SRF improving, early cryo changes; ORIGINALLY: schisis detachment from 0130-0400 with retinal hole at 0200; pigment deposition along posterior border of detachment - likely chronic        Refraction    Wearing Rx      Sphere Cylinder Axis Add Vert Prism   Right +0.50 +0.75 005 +2.75 2 BD   Left +0.25 +0.75 155 +2.75 2 BU          IMAGING AND PROCEDURES  Imaging and Procedures for 12/25/2020  OCT, Retina - OU - Both Eyes       Right Eye Quality was good. Central Foveal Thickness: 304. Progression has been stable. Findings include normal foveal contour, no IRF, no SRF, retinal drusen .   Left Eye Quality was good. Central Foveal Thickness: 310. Progression has improved. Findings include normal foveal contour, epiretinal membrane, no SRF, no IRF (Interval improvement in SRF ST quad caught on widefield).   Notes *Images captured and stored on drive  Diagnosis / Impression:  OD: NFP, no IRF/SRF OS: NFP, no IRF/SRF centrally; interval improvement SRF ST quad caught on widefield  Clinical management:  See below  Abbreviations: NFP - Normal foveal profile. CME - cystoid macular edema. PED - pigment epithelial detachment. IRF - intraretinal fluid. SRF - subretinal fluid. EZ - ellipsoid zone. ERM - epiretinal membrane. ORA -  outer retinal atrophy. ORT - outer retinal tubulation. SRHM - subretinal hyper-reflective material. IRHM - intraretinal hyper-reflective material               ASSESSMENT/PLAN:    ICD-10-CM   1. 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 Retinal edema  H35.81 OCT, Retina - OU - Both Eyes  4. Essential hypertension  I10   5. Hypertensive retinopathy of both eyes  H35.033   6. 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 Retinoschisis retinal detachment, OS - focal, peripheral schisis detachment from 0130-0330 - retinal hole at 0200 and +pigment along posterior border -- likely chronic - asymptomatic - s/p pneumatic retinopexy OS (04.05.22) - early improvement in ST SRF notable on exam and OCT - continue post procedure positioning -- face down, R head tilt - continue Maxitrol ung QID OS  - f/u Friday, April 8 -- POV OS  4,5. Hypertensive retinopathy OU - discussed importance of tight BP control - monitor  6. Mixed Cataract OU - The symptoms of cataract, surgical options, and treatments and risks were discussed with patient. - discussed diagnosis and progression - under the expert management of Dr. Ellie Lunch   Ophthalmic Meds Ordered this visit:  No orders of the defined types were placed in this encounter.      Return in about 2 days (around 12/27/2020) for f/u RD OS, DFE, OCT.  There are no Patient Instructions on file for this visit.   Explained the diagnoses, plan, and follow up with the patient and they expressed understanding.  Patient expressed understanding of the importance of proper follow up care.   This document serves as a record of services personally performed by Gardiner Sleeper, MD, PhD. It was created on their behalf by San Jetty. Owens Shark, OA an ophthalmic technician. The creation of this record is the provider's dictation  and/or activities during the visit.    Electronically signed by: San Jetty. Owens Shark,  [place] 04.06.2022 1:16 PM  Gardiner Sleeper, M.D., Ph.D. Diseases & Surgery of the Retina and Vitreous [facility]  I have reviewed the above documentation for accuracy and completeness, and I agree with the above. Gardiner Sleeper, M.D., Ph.D. 12/25/20 1:16 PM  Abbreviations: M myopia (nearsighted); A astigmatism; H hyperopia (farsighted); P presbyopia; Mrx spectacle prescription;  CTL contact lenses; OD right eye; OS left eye; OU both eyes  XT exotropia; ET esotropia; PEK punctate epithelial keratitis; PEE punctate epithelial erosions; DES dry eye syndrome; MGD meibomian gland dysfunction; ATs artificial tears; PFAT's preservative free artificial tears; [place] nuclear sclerotic cataract; PSC posterior subcapsular cataract; ERM epi-retinal membrane; PVD posterior vitreous detachment; RD retinal detachment; DM diabetes mellitus; DR diabetic retinopathy; NPDR non-proliferative diabetic retinopathy; PDR proliferative diabetic retinopathy; CSME clinically significant macular edema; DME diabetic macular edema; dbh dot blot hemorrhages; CWS cotton wool spot; POAG primary open angle glaucoma; C/D cup-to-disc ratio; HVF humphrey visual field; GVF goldmann visual field; OCT optical coherence tomography; IOP intraocular pressure; BRVO Branch retinal vein occlusion; CRVO central retinal vein occlusion; CRAO central retinal artery occlusion; BRAO branch retinal artery occlusion; RT retinal tear; SB scleral buckle; PPV pars plana vitrectomy; VH Vitreous hemorrhage; PRP panretinal laser photocoagulation; IVK intravitreal kenalog; VMT vitreomacular traction; MH Macular hole;  NVD neovascularization of the disc; NVE neovascularization elsewhere; AREDS age related eye disease study; ARMD age related macular degeneration; POAG primary open angle glaucoma; EBMD epithelial/anterior basement membrane dystrophy; ACIOL anterior chamber intraocular lens; IOL intraocular lens; PCIOL posterior chamber intraocular  lens; Phaco/IOL phacoemulsification with intraocular lens placement; [place] photorefractive keratectomy; LASIK laser assisted in situ keratomileusis; HTN hypertension; DM diabetes mellitus; COPD chronic obstructive pulmonary disease

## 2020-12-26 NOTE — Progress Notes (Signed)
[facility] Note  12/27/2020     CHIEF COMPLAINT Patient presents for Post-op Follow-up   HISTORY OF PRESENT ILLNESS: Melissa Lozano is a 80 y.o. female who presents to the clinic today for:   HPI    Post-op Follow-up    In left eye.  Vision is improved, is blurred at distance and is blurred at near.  I, the attending physician,  performed the HPI with the patient and updated documentation appropriately.          Comments    80 y/o female pt here for 2 day POV.  S/p pneumatic retinopexy for retinoschisis RD OS 4.5.22.  Pt doing well, and doing well with proper head positioning.  Pt sleeping well.  No change in [place] OD.  VA OS gradually improving.  Pt can see through gas bubble.  Denies pain, FOL, floaters.  Maxitrol ung QID OS.         Last edited by Bernarda Caffey, MD on 12/27/2020  1:42 PM. (History)    pt states   Referring physician: Luberta Mutter, MD [place],  [place] [address]  HISTORICAL INFORMATION:   Selected notes from the MEDICAL RECORD NUMBER Referred by Dr. Luberta Mutter for RD OS LEE:  Ocular Hx- PMH-    CURRENT MEDICATIONS: Current Outpatient Medications (Ophthalmic Drugs)  Medication Sig  . neomycin-polymyxin b-dexamethasone (MAXITROL) 3.5-10000-0.1 OINT Place 1 application into the left eye 4 (four) times daily.   No current facility-administered medications for this visit. (Ophthalmic Drugs)   Current Outpatient Medications (Other)  Medication Sig  . aspirin EC 81 MG tablet Take 81 mg by mouth.  Marland Kitchen atorvastatin (LIPITOR) 10 MG tablet TAKE ONE TABLET 3 times weekly  . calcium-vitamin D (OSCAL WITH D) 500-200 MG-UNIT TABS tablet Take 1 tablet by mouth 2 (two) times daily.  . cloNIDine (CATAPRES) 0.1 MG tablet Take 0.1 mg by mouth as needed.   Marland Kitchen denosumab (PROLIA) 60 MG/ML SOSY injection Inject 60 mg into the skin every 6 (six) months.  . diclofenac Sodium (VOLTAREN) 1 % GEL Apply topically daily.  .  Multiple Vitamin (MULTI-VITAMINS) TABS Take 1 tablet by mouth daily.   . Multiple Vitamins-Minerals (MULTIPLE VITAMINS/WOMENS PO) Take 1 tablet by mouth daily.  Marland Kitchen neomycin-polymyxin-hydrocortisone (CORTISPORIN) OTIC solution Apply 2-3 drops to the ingrown toenail site twice daily. Cover with band-aid.  Marland Kitchen nitroGLYCERIN (NITROSTAT) 0.4 MG SL tablet Place 1 tablet (0.4 mg total) under the tongue every 5 (five) minutes as needed for chest pain.  . Omega-3 Fatty Acids (FISH OIL) 1000 MG CAPS Take 1 capsule by mouth daily.  . polyethylene glycol powder (GLYCOLAX/MIRALAX) powder MIX 17GMS (1 CAPFUL) IN 8OZ OF LIQUID AND DRINK ONCE DAILY  . psyllium (METAMUCIL) 58.6 % packet Take 1 packet by mouth daily.  Marland Kitchen spironolactone-hydrochlorothiazide (ALDACTAZIDE) 25-25 MG per tablet Take 1 tablet by mouth 3 (three) times a week.   . verapamil (CALAN-SR) 180 MG CR tablet TAKE ONE TABLET BY MOUTH TWICE DAILY   Current Facility-Administered Medications (Other)  Medication Route  . dexamethasone (DECADRON) injection 4 mg Intra-articular      REVIEW OF SYSTEMS: ROS    Positive for: Musculoskeletal, Cardiovascular, Eyes   Negative for: Constitutional, Gastrointestinal, Neurological, Skin, Genitourinary, HENT, Endocrine, Respiratory, Psychiatric, Allergic/Imm, Heme/Lymph   Last edited by Matthew Folks, COA on 12/27/2020  8:56 AM. (History)       ALLERGIES Allergies  Allergen Reactions  . Sulfa Antibiotics Nausea Only  and Nausea And Vomiting    Other reaction(s): Vomiting Other reaction(s): Vomiting   . Codeine Itching  . Tape Itching  . Wound Dressing Adhesive Itching    PAST MEDICAL HISTORY Past Medical History:  Diagnosis Date  . Angina pectoris ([place]) 08/16/2015   Overview:  stable, no recurrence of her present meds and continue same with a CCB, aspirin and a statin. Stress echo negative for ischemia at 10 mets 08/23/13  . C. difficile colitis 06/10/2017  . Cataract    Mixed OU  . Coronary  artery spasm ([place]) 08/16/2015  . Essential hypertension 08/16/2015  . Hyperlipidemia 08/16/2015  . Hypertension   . Hypertensive retinopathy    OU  . Migraine 06/10/2017  . 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 Osteoporosis   . PAT (paroxysmal atrial tachycardia) ([place]) 08/16/2015  . Retinal detachment    OS   Past Surgical History:  Procedure Laterality Date  . ABDOMINAL HYSTERECTOMY    . BUNIONECTOMY     reverse bunionectomy  . CARDIAC CATHETERIZATION    . CHOLECYSTECTOMY    . EYE SURGERY Left 12/24/2020   Pneumatic retinopexy for retinoschisis RD repair - Dr. Bernarda Caffey  . HAMMER TOE SURGERY    . 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 Bernarda Caffey    FAMILY HISTORY Family History  Problem Relation Age of Onset  . Aneurysm Father   . Diabetes Maternal Grandmother     SOCIAL HISTORY Social History   Tobacco Use  . Smoking status: Never Smoker  . Smokeless tobacco: Never Used  Vaping Use  . Vaping Use: Never used  Substance Use Topics  . Alcohol use: Yes    Alcohol/week: 0.0 standard drinks    Comment: occasional  . Drug use: No         OPHTHALMIC EXAM:  Base Eye Exam    Visual Acuity (Snellen - Linear)      Right Left   Dist cc 20/30 -2 20/80 -2   Dist ph cc NI 20/60 -2       Tonometry (Tonopen, 9:01 AM)      Right Left   Pressure Def 14       Pupils      Dark Light Shape React APD   Right 3 2 Round Brisk None   Left 3 2 Round Brisk None       Visual Fields (Counting fingers)      Left Right    Full Full       Extraocular Movement      Right Left    Full, Ortho Full, Ortho       Neuro/Psych    Oriented x3: Yes   Mood/Affect: Normal       Dilation    Left eye: 1.0% Mydriacyl, 2.5% Phenylephrine @ 9:01 AM        Slit Lamp and Fundus Exam    External Exam      Right Left   External   periorbital erythema nasal       Slit Lamp Exam      Right Left   Lids/Lashes Dermatochalasis - upper lid Dermatochalasis - upper lid, periorbital errythema nasal   Conjunctiva/Sclera White and quiet Subconjunctival hemorrhage, Chemosis -- both improving   Cornea arcus arcus, trace PEE   Anterior Chamber Deep and quiet Deep and quiet   Iris Round  and dilated Round and dilated   Lens 3+ Nuclear sclerosis, 3+ Cortical cataract 3+ Nuclear sclerosis, 3+ Cortical cataract   Vitreous Vitreous syneresis, no pigment, Posterior vitreous detachment Vitreous syneresis, trace pigment, Posterior vitreous detachment, vitreous condensations, 25-30% gas bubble       Fundus Exam      Right Left   Disc Pink and Sharp, Compact Pink and Sharp, Compact   C/D Ratio 0.1 0.1   Macula Flat, Blunted foveal reflex, mild Drusen, RPE mottling and clumping, No heme or edema Flat, Good foveal reflex, RPE mottling and clumping, mild Drusen   Vessels mild attenuation, mild tortuousity attenuated, mild tortuousity   Periphery Attached; no RT/RD or heme ST SRF improving, early cryo changes; ORIGINALLY: schisis detachment from 0130-0400 with retinal hole at 0200; pigment deposition along posterior border of detachment - likely chronic          IMAGING AND PROCEDURES  Imaging and Procedures for 12/27/2020  OCT, Retina - OU - Both Eyes       Right Eye Quality was good. Central Foveal Thickness: 303. Progression has been stable. Findings include normal foveal contour, no IRF, no SRF, retinal drusen .   Left Eye Quality was good. Central Foveal Thickness: 317. Progression has improved. Findings include normal foveal contour, epiretinal membrane, no SRF, no IRF (Stable improvement in SRF ST, mild interval improvement in vitreous opacities).   Notes *Images captured and stored on drive  Diagnosis / Impression:  OD: NFP, no IRF/SRF OS: NFP, no IRF/SRF centrally; Stable improvement in SRF ST, mild interval improvement  in vitreous opacities  Clinical management:  See below  Abbreviations: NFP - Normal foveal profile. CME - cystoid macular edema. PED - pigment epithelial detachment. IRF - intraretinal fluid. SRF - subretinal fluid. EZ - ellipsoid zone. ERM - epiretinal membrane. ORA - outer retinal atrophy. ORT - outer retinal tubulation. SRHM - subretinal hyper-reflective material. IRHM - intraretinal hyper-reflective material               ASSESSMENT/PLAN:    ICD-10-CM   1. 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 Retinal edema  H35.81 OCT, Retina - OU - Both Eyes  4. Essential hypertension  I10   5. Hypertensive retinopathy of both eyes  H35.033   6. 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 Retinoschisis retinal detachment, OS - focal, peripheral schisis detachment from 0130-0330 - retinal hole at 0200 and +pigment along posterior border -- likely chronic - asymptomatic - s/p pneumatic retinopexy OS (04.05.22) - pt doing well with positioning - improvement in ST SRF on exam and OCT - continue post procedure positioning -- face down, R head tilt - continue Maxitrol ung QID OS  - f/u Monday, April 11 -- POV OS -- OCT through detachment  4,5. Hypertensive retinopathy OU - discussed importance of tight BP control - monitor  6. Mixed Cataract OU - The symptoms of cataract, surgical options, and treatments and risks were discussed with patient. - discussed diagnosis and progression - under the expert management of Dr. Ellie Lunch   Ophthalmic Meds Ordered this visit:  No orders of the defined types were placed in this encounter.      Return in about 3 days (around 12/30/2020) for f/u RD OS, DFE, OCT.  There are no Patient Instructions on file for this visit.   Explained the diagnoses, plan, and follow up with the patient and they  expressed understanding.  Patient expressed understanding of the importance of proper follow up  care.   This document serves as a record of services personally performed by Gardiner Sleeper, MD, PhD. It was created on their behalf by San Jetty. Owens Shark, OA an ophthalmic technician. The creation of this record is the provider's dictation and/or activities during the visit.    Electronically signed by: San Jetty. Owens Shark, [place] 04.07.2022 1:46 PM  Gardiner Sleeper, M.D., Ph.D. Diseases & Surgery of the Retina and Vitreous [facility]  I have reviewed the above documentation for accuracy and completeness, and I agree with the above. Gardiner Sleeper, M.D., Ph.D. 12/27/20 1:46 PM  Abbreviations: M myopia (nearsighted); A astigmatism; H hyperopia (farsighted); P presbyopia; Mrx spectacle prescription;  CTL contact lenses; OD right eye; OS left eye; OU both eyes  XT exotropia; ET esotropia; PEK punctate epithelial keratitis; PEE punctate epithelial erosions; DES dry eye syndrome; MGD meibomian gland dysfunction; ATs artificial tears; PFAT's preservative free artificial tears; [place] nuclear sclerotic cataract; PSC posterior subcapsular cataract; ERM epi-retinal membrane; PVD posterior vitreous detachment; RD retinal detachment; DM diabetes mellitus; DR diabetic retinopathy; NPDR non-proliferative diabetic retinopathy; PDR proliferative diabetic retinopathy; CSME clinically significant macular edema; DME diabetic macular edema; dbh dot blot hemorrhages; CWS cotton wool spot; POAG primary open angle glaucoma; C/D cup-to-disc ratio; HVF humphrey visual field; GVF goldmann visual field; OCT optical coherence tomography; IOP intraocular pressure; BRVO Branch retinal vein occlusion; CRVO central retinal vein occlusion; CRAO central retinal artery occlusion; BRAO branch retinal artery occlusion; RT retinal tear; SB scleral buckle; PPV pars plana vitrectomy; VH Vitreous hemorrhage; PRP panretinal laser photocoagulation; IVK intravitreal kenalog; VMT vitreomacular traction; MH Macular hole;  NVD  neovascularization of the disc; NVE neovascularization elsewhere; AREDS age related eye disease study; ARMD age related macular degeneration; POAG primary open angle glaucoma; EBMD epithelial/anterior basement membrane dystrophy; ACIOL anterior chamber intraocular lens; IOL intraocular lens; PCIOL posterior chamber intraocular lens; Phaco/IOL phacoemulsification with intraocular lens placement; [place] photorefractive keratectomy; LASIK laser assisted in situ keratomileusis; HTN hypertension; DM diabetes mellitus; COPD chronic obstructive pulmonary disease

## 2020-12-27 ENCOUNTER — Ambulatory Visit (INDEPENDENT_AMBULATORY_CARE_PROVIDER_SITE_OTHER): Payer: Medicare PPO | Admitting: Ophthalmology

## 2020-12-27 ENCOUNTER — Other Ambulatory Visit: Payer: Self-pay

## 2020-12-27 ENCOUNTER — Encounter (INDEPENDENT_AMBULATORY_CARE_PROVIDER_SITE_OTHER): Payer: Self-pay | Admitting: Ophthalmology

## 2020-12-27 ENCOUNTER — Encounter (INDEPENDENT_AMBULATORY_CARE_PROVIDER_SITE_OTHER): Payer: Medicare PPO | Admitting: Ophthalmology

## 2020-12-27 DIAGNOSIS — H33102 Unspecified retinoschisis, left eye: Secondary | ICD-10-CM | POA: Diagnosis not present

## 2020-12-27 DIAGNOSIS — H25813 Combined forms of age-related cataract, bilateral: Secondary | ICD-10-CM

## 2020-12-27 DIAGNOSIS — H35033 Hypertensive retinopathy, bilateral: Secondary | ICD-10-CM

## 2020-12-27 DIAGNOSIS — I10 Essential (primary) hypertension: Secondary | ICD-10-CM | POA: Diagnosis not present

## 2020-12-27 DIAGNOSIS — H3581 Retinal edema: Secondary | ICD-10-CM

## 2020-12-27 DIAGNOSIS — H3322 Serous retinal detachment, left eye: Secondary | ICD-10-CM

## 2020-12-30 ENCOUNTER — Other Ambulatory Visit: Payer: Self-pay

## 2020-12-30 ENCOUNTER — Ambulatory Visit (INDEPENDENT_AMBULATORY_CARE_PROVIDER_SITE_OTHER): Payer: Medicare PPO | Admitting: Ophthalmology

## 2020-12-30 ENCOUNTER — Encounter (INDEPENDENT_AMBULATORY_CARE_PROVIDER_SITE_OTHER): Payer: Self-pay | Admitting: Ophthalmology

## 2020-12-30 DIAGNOSIS — H3581 Retinal edema: Secondary | ICD-10-CM | POA: Diagnosis not present

## 2020-12-30 DIAGNOSIS — I10 Essential (primary) hypertension: Secondary | ICD-10-CM

## 2020-12-30 DIAGNOSIS — H3322 Serous retinal detachment, left eye: Secondary | ICD-10-CM | POA: Diagnosis not present

## 2020-12-30 DIAGNOSIS — H33102 Unspecified retinoschisis, left eye: Secondary | ICD-10-CM | POA: Diagnosis not present

## 2020-12-30 DIAGNOSIS — H25813 Combined forms of age-related cataract, bilateral: Secondary | ICD-10-CM

## 2020-12-30 DIAGNOSIS — H35033 Hypertensive retinopathy, bilateral: Secondary | ICD-10-CM | POA: Diagnosis not present

## 2020-12-30 NOTE — Progress Notes (Signed)
[facility] Note  12/30/2020     CHIEF COMPLAINT Patient presents for Retina Follow Up   HISTORY OF PRESENT ILLNESS: Melissa Lozano is a 80 y.o. female who presents to the clinic today for:   HPI    Retina Follow Up    Patient presents with  Retinal Break/Detachment.  In left eye.  This started 3 days ago.  I, the attending physician,  performed the HPI with the patient and updated documentation appropriately.          Comments    Pt here 3 day f/u RD OS. Pt states vision is OK, OS is blurry all of the time because of the ointment shes putting in.        Last edited by Bernarda Caffey, MD on 12/30/2020 12:35 PM. (History)    pt states the gas bubble is still "big and black"  Referring physician: Nicoletta Dress, MD [place],  [place] [id number]  HISTORICAL INFORMATION:   Selected notes from the MEDICAL RECORD NUMBER Referred by Dr. Luberta Mutter for RD OS LEE:  Ocular Hx- PMH-    CURRENT MEDICATIONS: Current Outpatient Medications (Ophthalmic Drugs)  Medication Sig  . neomycin-polymyxin b-dexamethasone (MAXITROL) 3.5-10000-0.1 OINT Place 1 application into the left eye 4 (four) times daily.   No current facility-administered medications for this visit. (Ophthalmic Drugs)   Current Outpatient Medications (Other)  Medication Sig  . aspirin EC 81 MG tablet Take 81 mg by mouth.  Marland Kitchen atorvastatin (LIPITOR) 10 MG tablet TAKE ONE TABLET 3 times weekly  . calcium-vitamin D (OSCAL WITH D) 500-200 MG-UNIT TABS tablet Take 1 tablet by mouth 2 (two) times daily.  . cloNIDine (CATAPRES) 0.1 MG tablet Take 0.1 mg by mouth as needed.   Marland Kitchen denosumab (PROLIA) 60 MG/ML SOSY injection Inject 60 mg into the skin every 6 (six) months.  . diclofenac Sodium (VOLTAREN) 1 % GEL Apply topically daily.  . Multiple Vitamin (MULTI-VITAMINS) TABS Take 1 tablet by mouth daily.   . Multiple Vitamins-Minerals (MULTIPLE VITAMINS/WOMENS PO)  Take 1 tablet by mouth daily.  Marland Kitchen neomycin-polymyxin-hydrocortisone (CORTISPORIN) OTIC solution Apply 2-3 drops to the ingrown toenail site twice daily. Cover with band-aid.  Marland Kitchen nitroGLYCERIN (NITROSTAT) 0.4 MG SL tablet Place 1 tablet (0.4 mg total) under the tongue every 5 (five) minutes as needed for chest pain.  . Omega-3 Fatty Acids (FISH OIL) 1000 MG CAPS Take 1 capsule by mouth daily.  . polyethylene glycol powder (GLYCOLAX/MIRALAX) powder MIX 17GMS (1 CAPFUL) IN 8OZ OF LIQUID AND DRINK ONCE DAILY  . psyllium (METAMUCIL) 58.6 % packet Take 1 packet by mouth daily.  Marland Kitchen spironolactone-hydrochlorothiazide (ALDACTAZIDE) 25-25 MG per tablet Take 1 tablet by mouth 3 (three) times a week.   . verapamil (CALAN-SR) 180 MG CR tablet TAKE ONE TABLET BY MOUTH TWICE DAILY   Current Facility-Administered Medications (Other)  Medication Route  . dexamethasone (DECADRON) injection 4 mg Intra-articular      REVIEW OF SYSTEMS: ROS    Positive for: Musculoskeletal, Cardiovascular, Eyes   Negative for: Constitutional, Gastrointestinal, Neurological, Skin, Genitourinary, HENT, Endocrine, Respiratory, Psychiatric, Allergic/Imm, Heme/Lymph   Last edited by Kingsley Spittle, COT on 12/30/2020  9:31 AM. (History)       ALLERGIES Allergies  Allergen Reactions  . Sulfa Antibiotics Nausea Only and Nausea And Vomiting    Other reaction(s): Vomiting Other reaction(s): Vomiting   . Codeine Itching  . Tape Itching  . Wound  Dressing Adhesive Itching    PAST MEDICAL HISTORY Past Medical History:  Diagnosis Date  . Angina pectoris ([place]) 08/16/2015   Overview:  stable, no recurrence of her present meds and continue same with a CCB, aspirin and a statin. Stress echo negative for ischemia at 10 mets 08/23/13  . C. difficile colitis 06/10/2017  . Cataract    Mixed OU  . Coronary artery spasm ([place]) 08/16/2015  . Essential hypertension 08/16/2015  . Hyperlipidemia 08/16/2015  . Hypertension   .  Hypertensive retinopathy    OU  . Migraine 06/10/2017  . 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 Osteoporosis   . PAT (paroxysmal atrial tachycardia) ([place]) 08/16/2015  . Retinal detachment    OS   Past Surgical History:  Procedure Laterality Date  . ABDOMINAL HYSTERECTOMY    . BUNIONECTOMY     reverse bunionectomy  . CARDIAC CATHETERIZATION    . CHOLECYSTECTOMY    . EYE SURGERY Left 12/24/2020   Pneumatic retinopexy for retinoschisis RD repair - Dr. Bernarda Caffey  . HAMMER TOE SURGERY    . 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 Bernarda Caffey    FAMILY HISTORY Family History  Problem Relation Age of Onset  . Aneurysm Father   . Diabetes Maternal Grandmother     SOCIAL HISTORY Social History   Tobacco Use  . Smoking status: Never Smoker  . Smokeless tobacco: Never Used  Vaping Use  . Vaping Use: Never used  Substance Use Topics  . Alcohol use: Yes    Alcohol/week: 0.0 standard drinks    Comment: occasional  . Drug use: No         OPHTHALMIC EXAM:  Base Eye Exam    Visual Acuity (Snellen - Linear)      Right Left   Dist cc 20/30 -2 20/60 -2   Dist ph cc NI NI   Correction: Glasses       Tonometry (Tonopen, 9:38 AM)      Right Left   Pressure 12 9       Pupils      Dark Light Shape React APD   Right 3 2 Round Brisk None   Left 3 2 Round Slow None       Visual Fields (Counting fingers)      Left Right    Full Full       Extraocular Movement      Right Left    Full, Ortho Full, Ortho       Neuro/Psych    Oriented x3: Yes   Mood/Affect: Normal       Dilation    Left eye: 1.0% Mydriacyl, 2.5% Phenylephrine @ 9:38 AM        Slit Lamp and Fundus Exam    External Exam      Right Left   External  periorbital erythema nasal       Slit Lamp Exam      Right Left   Lids/Lashes Dermatochalasis - upper  lid Dermatochalasis - upper lid, periorbital errythema nasal   Conjunctiva/Sclera White and quiet Subconjunctival hemorrhage, Chemosis -- both improving   Cornea arcus arcus, trace PEE   Anterior Chamber Deep and quiet Deep and quiet   Iris Round and dilated Round and dilated   Lens 3+ Nuclear sclerosis, 3+ Cortical cataract 3+ Nuclear sclerosis, 3+ Cortical cataract  Vitreous Vitreous syneresis, no pigment, Posterior vitreous detachment Vitreous syneresis, trace pigment, Posterior vitreous detachment, vitreous condensations, 25-30% gas bubble       Fundus Exam      Right Left   Disc  Pink and Sharp, Compact   C/D Ratio 0.1 0.1   Macula  Flat, Good foveal reflex, RPE mottling and clumping, mild Drusen   Vessels  attenuated, mild tortuousity   Periphery  ST SRF improving, cryo changes progressing; ORIGINALLY: schisis detachment from 0130-0400 with retinal hole at 0200; pigment deposition along posterior border of detachment - likely chronic        Refraction    Wearing Rx      Sphere Cylinder Axis Add Vert Prism   Right +0.50 +0.75 005 +2.75 2 BD   Left +0.25 +0.75 155 +2.75 2 BU          IMAGING AND PROCEDURES  Imaging and Procedures for 12/30/2020  OCT, Retina - OU - Both Eyes       Right Eye Quality was good. Central Foveal Thickness: 304. Progression has been stable. Findings include normal foveal contour, no IRF, no SRF, retinal drusen .   Left Eye Quality was good. Central Foveal Thickness: 321. Progression has been stable. Findings include normal foveal contour, epiretinal membrane, no SRF, no IRF (Stable improvement in SRF ST, persistent vitreous opacities).   Notes *Images captured and stored on drive  Diagnosis / Impression:  OD: NFP, no IRF/SRF OS: NFP, no IRF/SRF centrally; Stable improvement in SRF ST, persistent vitreous opacities  Clinical management:  See below  Abbreviations: NFP - Normal foveal profile. CME - cystoid macular edema. PED - pigment  epithelial detachment. IRF - intraretinal fluid. SRF - subretinal fluid. EZ - ellipsoid zone. ERM - epiretinal membrane. ORA - outer retinal atrophy. ORT - outer retinal tubulation. SRHM - subretinal hyper-reflective material. IRHM - intraretinal hyper-reflective material               ASSESSMENT/PLAN:    ICD-10-CM   1. 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 Retinal edema  H35.81 OCT, Retina - OU - Both Eyes  4. Essential hypertension  I10   5. Hypertensive retinopathy of both eyes  H35.033   6. 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 Retinoschisis retinal detachment, OS - focal, peripheral schisis detachment from 0130-0330 - retinal hole at 0200 and +pigment along posterior border -- likely chronic - asymptomatic - s/p pneumatic retinopexy OS (04.05.22) - pt doing well with positioning - improvement in ST SRF on exam and OCT - cryo changes progressing - continue post procedure positioning -- face down, R head tilt - continue Maxitrol ung QID OS  - f/u Thursday, April 14 -- POV OS -- OCT through detachment  4,5. Hypertensive retinopathy OU - discussed importance of tight BP control - monitor  6. Mixed Cataract OU - The symptoms of cataract, surgical options, and treatments and risks were discussed with patient. - discussed diagnosis and progression - under the expert management of Dr. Ellie Lunch   Ophthalmic Meds Ordered this visit:  No orders of the defined types were placed in this encounter.      Return in about 3 days (around 01/02/2021) for f/u RD OS, DFE, OCT.  There are no Patient Instructions on file for this visit.   Explained the diagnoses, plan, and follow up with the patient and they expressed understanding.  Patient  expressed understanding of the importance of proper follow up care.   This document serves as a record of services personally performed by Gardiner Sleeper, MD, PhD. It was created  on their behalf by San Jetty. Owens Shark, OA an ophthalmic technician. The creation of this record is the provider's dictation and/or activities during the visit.    Electronically signed by: San Jetty. Marguerita Merles 04.11.2022 12:35 PM  Gardiner Sleeper, M.D., Ph.D. Diseases & Surgery of the Retina and Vitreous [facility]  I have reviewed the above documentation for accuracy and completeness, and I agree with the above. Gardiner Sleeper, M.D., Ph.D. 12/30/20 12:36 PM   Abbreviations: M myopia (nearsighted); A astigmatism; H hyperopia (farsighted); P presbyopia; Mrx spectacle prescription;  CTL contact lenses; OD right eye; OS left eye; OU both eyes  XT exotropia; ET esotropia; PEK punctate epithelial keratitis; PEE punctate epithelial erosions; DES dry eye syndrome; MGD meibomian gland dysfunction; ATs artificial tears; PFAT's preservative free artificial tears; [place] nuclear sclerotic cataract; PSC posterior subcapsular cataract; ERM epi-retinal membrane; PVD posterior vitreous detachment; RD retinal detachment; DM diabetes mellitus; DR diabetic retinopathy; NPDR non-proliferative diabetic retinopathy; PDR proliferative diabetic retinopathy; CSME clinically significant macular edema; DME diabetic macular edema; dbh dot blot hemorrhages; CWS cotton wool spot; POAG primary open angle glaucoma; C/D cup-to-disc ratio; HVF humphrey visual field; GVF goldmann visual field; OCT optical coherence tomography; IOP intraocular pressure; BRVO Branch retinal vein occlusion; CRVO central retinal vein occlusion; CRAO central retinal artery occlusion; BRAO branch retinal artery occlusion; RT retinal tear; SB scleral buckle; PPV pars plana vitrectomy; VH Vitreous hemorrhage; PRP panretinal laser photocoagulation; IVK intravitreal kenalog; VMT vitreomacular traction; MH Macular hole;  NVD neovascularization of the disc; NVE neovascularization elsewhere; AREDS age related eye disease study; ARMD age related  macular degeneration; POAG primary open angle glaucoma; EBMD epithelial/anterior basement membrane dystrophy; ACIOL anterior chamber intraocular lens; IOL intraocular lens; PCIOL posterior chamber intraocular lens; Phaco/IOL phacoemulsification with intraocular lens placement; [place] photorefractive keratectomy; LASIK laser assisted in situ keratomileusis; HTN hypertension; DM diabetes mellitus; COPD chronic obstructive pulmonary disease

## 2021-01-01 ENCOUNTER — Ambulatory Visit: Payer: Medicare PPO | Admitting: Sports Medicine

## 2021-01-01 NOTE — Progress Notes (Signed)
[facility] Note  01/02/2021     CHIEF COMPLAINT Patient presents for Post-op Follow-up   HISTORY OF PRESENT ILLNESS: Melissa Lozano is a 80 y.o. female who presents to the clinic today for:   HPI    Post-op Follow-up    In left eye.  Vision is stable.  I, the attending physician,  performed the HPI with the patient and updated documentation appropriately.          Comments    Pt states vision is about the same OS.  Pt denies eye pain.  Pt complains of stiffness and discomfort in neck from head positioning--pt states she is unsure she can continue to do this.  Pt denies eye pain.  Pt denies any new or worsening floaters or fol OU.       Last edited by Bernarda Caffey, MD on 01/02/2021  9:29 PM. (History)      Referring physician: Luberta Mutter, MD [place],  [place] [id number]  HISTORICAL INFORMATION:   Selected notes from the MEDICAL RECORD NUMBER Referred by Dr. Luberta Mutter for RD OS   CURRENT MEDICATIONS: Current Outpatient Medications (Ophthalmic Drugs)  Medication Sig  . neomycin-polymyxin b-dexamethasone (MAXITROL) 3.5-10000-0.1 OINT Place 1 application into the left eye 4 (four) times daily.   No current facility-administered medications for this visit. (Ophthalmic Drugs)   Current Outpatient Medications (Other)  Medication Sig  . aspirin EC 81 MG tablet Take 81 mg by mouth.  Marland Kitchen atorvastatin (LIPITOR) 10 MG tablet TAKE ONE TABLET 3 times weekly  . calcium-vitamin D (OSCAL WITH D) 500-200 MG-UNIT TABS tablet Take 1 tablet by mouth 2 (two) times daily.  . cloNIDine (CATAPRES) 0.1 MG tablet Take 0.1 mg by mouth as needed.   Marland Kitchen denosumab (PROLIA) 60 MG/ML SOSY injection Inject 60 mg into the skin every 6 (six) months.  . diclofenac Sodium (VOLTAREN) 1 % GEL Apply topically daily.  . Multiple Vitamin (MULTI-VITAMINS) TABS Take 1 tablet by mouth daily.   . Multiple Vitamins-Minerals (MULTIPLE VITAMINS/WOMENS PO) Take 1  tablet by mouth daily.  Marland Kitchen neomycin-polymyxin-hydrocortisone (CORTISPORIN) OTIC solution Apply 2-3 drops to the ingrown toenail site twice daily. Cover with band-aid.  Marland Kitchen nitroGLYCERIN (NITROSTAT) 0.4 MG SL tablet Place 1 tablet (0.4 mg total) under the tongue every 5 (five) minutes as needed for chest pain.  . Omega-3 Fatty Acids (FISH OIL) 1000 MG CAPS Take 1 capsule by mouth daily.  . polyethylene glycol powder (GLYCOLAX/MIRALAX) powder MIX 17GMS (1 CAPFUL) IN 8OZ OF LIQUID AND DRINK ONCE DAILY  . psyllium (METAMUCIL) 58.6 % packet Take 1 packet by mouth daily.  Marland Kitchen spironolactone-hydrochlorothiazide (ALDACTAZIDE) 25-25 MG per tablet Take 1 tablet by mouth 3 (three) times a week.   . verapamil (CALAN-SR) 180 MG CR tablet TAKE ONE TABLET BY MOUTH TWICE DAILY   Current Facility-Administered Medications (Other)  Medication Route  . dexamethasone (DECADRON) injection 4 mg Intra-articular      REVIEW OF SYSTEMS: ROS    Positive for: Musculoskeletal, Cardiovascular, Eyes   Negative for: Constitutional, Gastrointestinal, Neurological, Skin, Genitourinary, HENT, Endocrine, Respiratory, Psychiatric, Allergic/Imm, Heme/Lymph   Last edited by Doneen Poisson on 01/02/2021  9:07 AM. (History)       ALLERGIES Allergies  Allergen Reactions  . Sulfa Antibiotics Nausea Only and Nausea And Vomiting    Other reaction(s): Vomiting Other reaction(s): Vomiting   . Codeine Itching  . Tape Itching  . Wound Dressing Adhesive Itching  PAST MEDICAL HISTORY Past Medical History:  Diagnosis Date  . Angina pectoris ([place]) 08/16/2015   Overview:  stable, no recurrence of her present meds and continue same with a CCB, aspirin and a statin. Stress echo negative for ischemia at 10 mets 08/23/13  . C. difficile colitis 06/10/2017  . Cataract    Mixed OU  . Coronary artery spasm ([place]) 08/16/2015  . Essential hypertension 08/16/2015  . Hyperlipidemia 08/16/2015  . Hypertension   . Hypertensive  retinopathy    OU  . Migraine 06/10/2017  . 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 Osteoporosis   . PAT (paroxysmal atrial tachycardia) ([place]) 08/16/2015  . Retinal detachment    OS   Past Surgical History:  Procedure Laterality Date  . ABDOMINAL HYSTERECTOMY    . BUNIONECTOMY     reverse bunionectomy  . CARDIAC CATHETERIZATION    . CHOLECYSTECTOMY    . EYE SURGERY Left 12/24/2020   Pneumatic retinopexy for retinoschisis RD repair - Dr. Bernarda Caffey  . HAMMER TOE SURGERY    . 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 Bernarda Caffey    FAMILY HISTORY Family History  Problem Relation Age of Onset  . Aneurysm Father   . Diabetes Maternal Grandmother     SOCIAL HISTORY Social History   Tobacco Use  . Smoking status: Never Smoker  . Smokeless tobacco: Never Used  Vaping Use  . Vaping Use: Never used  Substance Use Topics  . Alcohol use: Yes    Alcohol/week: 0.0 standard drinks    Comment: occasional  . Drug use: No         OPHTHALMIC EXAM:  Base Eye Exam    Visual Acuity (Snellen - Linear)      Right Left   Dist cc 20/30 -1 20/40 +1   Dist ph cc NI NI   Correction: Glasses       Tonometry (Tonopen, 9:11 AM)      Right Left   Pressure def 12       Pupils      Dark Light Shape React APD   Right 4 3 Round Brisk 0   Left 4 3 Round Brisk 0       Visual Fields      Left Right    Full Full       Extraocular Movement      Right Left    Full Full       Neuro/Psych    Oriented x3: Yes   Mood/Affect: Normal       Dilation    Left eye: 1.0% Mydriacyl, 2.5% Phenylephrine @ 9:11 AM        Slit Lamp and Fundus Exam    External Exam      Right Left   External  periorbital erythema nasal       Slit Lamp Exam      Right Left   Lids/Lashes Dermatochalasis - upper lid Dermatochalasis - upper lid, periorbital  errythema nasal   Conjunctiva/Sclera White and quiet Subconjunctival hemorrhage, Chemosis -- both improving   Cornea arcus arcus, trace PEE   Anterior Chamber Deep and quiet Deep and quiet   Iris Round and dilated Round and dilated   Lens 3+ Nuclear sclerosis, 3+ Cortical cataract 3+ Nuclear sclerosis, 3+ Cortical cataract   Vitreous Vitreous syneresis, no pigment, Posterior vitreous detachment Vitreous syneresis,  trace pigment, Posterior vitreous detachment, vitreous condensations, 25-30% gas bubble       Fundus Exam      Right Left   Disc  Pink and Sharp, Compact   C/D Ratio 0.1 0.1   Macula  Flat, Good foveal reflex, RPE mottling and clumping, mild Drusen   Vessels  attenuated, mild tortuousity   Periphery  ST SRF improved, cryo changes progressing; ORIGINALLY: schisis detachment from 0130-0400 with retinal hole at 0200; pigment deposition along posterior border of detachment - likely chronic        Refraction    Wearing Rx      Sphere Cylinder Axis Add Vert Prism   Right +0.50 +0.75 005 +2.75 2 BD   Left +0.25 +0.75 155 +2.75 2 BU          IMAGING AND PROCEDURES  Imaging and Procedures for 01/02/2021  OCT, Retina - OU - Both Eyes       Right Eye Quality was good. Central Foveal Thickness: 307. Progression has been stable. Findings include normal foveal contour, no IRF, no SRF, retinal drusen .   Left Eye Quality was good. Central Foveal Thickness: 323. Progression has been stable. Findings include normal foveal contour, epiretinal membrane, no SRF, no IRF (Stable improvement in SRF ST periphery, mild improvement in vitreous opacities).   Notes *Images captured and stored on drive  Diagnosis / Impression:  OD: NFP, no IRF/SRF OS: NFP, no IRF/SRF centrally; Stable improvement in SRF ST periphery, mild improvement in vitreous opacities  Clinical management:  See below  Abbreviations: NFP - Normal foveal profile. CME - cystoid macular edema. PED - pigment epithelial  detachment. IRF - intraretinal fluid. SRF - subretinal fluid. EZ - ellipsoid zone. ERM - epiretinal membrane. ORA - outer retinal atrophy. ORT - outer retinal tubulation. SRHM - subretinal hyper-reflective material. IRHM - intraretinal hyper-reflective material               ASSESSMENT/PLAN:    ICD-10-CM   1. 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 Retinal edema  H35.81 OCT, Retina - OU - Both Eyes  4. Essential hypertension  I10   5. Hypertensive retinopathy of both eyes  H35.033   6. 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 Hypertensive retinopathy OU - discussed importance of tight BP control - monitor  6. Mixed Cataract OU - The symptoms of cataract, surgical options, and treatments and risks were discussed with patient. - discussed diagnosis and progression - under the expert management of Dr. Ellie Lunch   Ophthalmic Meds Ordered this visit:  No orders of the defined types were placed in this encounter.      Return in about 6 days (around 01/08/2021) for POV.  There are no Patient Instructions on file for this visit.   Explained the diagnoses, plan, and follow up with the patient and they expressed understanding.  Patient expressed understanding of  the importance of proper follow up care.   This document serves as a record of services personally performed by Gardiner Sleeper,  MD, PhD. It was created on their behalf by Estill Bakes, COT an ophthalmic technician. The creation of this record is the provider's dictation and/or activities during the visit.    Electronically signed by: Estill Bakes, COT 4.13.22 @ 9:30 PM   This document serves as a record of services personally performed by Gardiner Sleeper, MD, PhD. It was created on their behalf by San Jetty. Owens Shark, OA an ophthalmic technician. The creation of this record is the provider's dictation and/or activities during the visit.    Electronically signed by: San Jetty. Marguerita Merles 04.14.2022 9:30 PM  Gardiner Sleeper, M.D., Ph.D. Diseases & Surgery of the Retina and [place] 01/02/2021   I have reviewed the above documentation for accuracy and completeness, and I agree with the above. Gardiner Sleeper, M.D., Ph.D. 01/02/21 9:30 PM  Abbreviations: M myopia (nearsighted); A astigmatism; H hyperopia (farsighted); P presbyopia; Mrx spectacle prescription;  CTL contact lenses; OD right eye; OS left eye; OU both eyes  XT exotropia; ET esotropia; PEK punctate epithelial keratitis; PEE punctate epithelial erosions; DES dry eye syndrome; MGD meibomian gland dysfunction; ATs artificial tears; PFAT's preservative free artificial tears; [place] nuclear sclerotic cataract; PSC posterior subcapsular cataract; ERM epi-retinal membrane; PVD posterior vitreous detachment; RD retinal detachment; DM diabetes mellitus; DR diabetic retinopathy; NPDR non-proliferative diabetic retinopathy; PDR proliferative diabetic retinopathy; CSME clinically significant macular edema; DME diabetic macular edema; dbh dot blot hemorrhages; CWS cotton wool spot; POAG primary open angle glaucoma; C/D cup-to-disc ratio; HVF humphrey visual field; GVF goldmann visual field; OCT optical coherence tomography; IOP intraocular pressure; BRVO Branch retinal vein occlusion; CRVO central retinal vein occlusion; CRAO central retinal artery  occlusion; BRAO branch retinal artery occlusion; RT retinal tear; SB scleral buckle; PPV pars plana vitrectomy; VH Vitreous hemorrhage; PRP panretinal laser photocoagulation; IVK intravitreal kenalog; VMT vitreomacular traction; MH Macular hole;  NVD neovascularization of the disc; NVE neovascularization elsewhere; AREDS age related eye disease study; ARMD age related macular degeneration; POAG primary open angle glaucoma; EBMD epithelial/anterior basement membrane dystrophy; ACIOL anterior chamber intraocular lens; IOL intraocular lens; PCIOL posterior chamber intraocular lens; Phaco/IOL phacoemulsification with intraocular lens placement; [place] photorefractive keratectomy; LASIK laser assisted in situ keratomileusis; HTN hypertension; DM diabetes mellitus; COPD chronic obstructive pulmonary disease

## 2021-01-02 ENCOUNTER — Ambulatory Visit (INDEPENDENT_AMBULATORY_CARE_PROVIDER_SITE_OTHER): Payer: Medicare PPO | Admitting: Ophthalmology

## 2021-01-02 ENCOUNTER — Encounter (INDEPENDENT_AMBULATORY_CARE_PROVIDER_SITE_OTHER): Payer: Self-pay | Admitting: Ophthalmology

## 2021-01-02 ENCOUNTER — Other Ambulatory Visit: Payer: Self-pay

## 2021-01-02 DIAGNOSIS — H25813 Combined forms of age-related cataract, bilateral: Secondary | ICD-10-CM

## 2021-01-02 DIAGNOSIS — I10 Essential (primary) hypertension: Secondary | ICD-10-CM

## 2021-01-02 DIAGNOSIS — H3322 Serous retinal detachment, left eye: Secondary | ICD-10-CM | POA: Diagnosis not present

## 2021-01-02 DIAGNOSIS — H35033 Hypertensive retinopathy, bilateral: Secondary | ICD-10-CM | POA: Diagnosis not present

## 2021-01-02 DIAGNOSIS — H3581 Retinal edema: Secondary | ICD-10-CM | POA: Diagnosis not present

## 2021-01-02 DIAGNOSIS — H33102 Unspecified retinoschisis, left eye: Secondary | ICD-10-CM | POA: Diagnosis not present

## 2021-01-02 NOTE — Progress Notes (Incomplete)
[facility] Note  01/08/2021     CHIEF COMPLAINT Patient presents for No chief complaint on file.   HISTORY OF PRESENT ILLNESS: Melissa Lozano is a 80 y.o. female who presents to the clinic today for:   pt states the gas bubble is still "big and black"  Referring physician: Nicoletta Dress, MD [place],  [place] [address]  HISTORICAL INFORMATION:   Selected notes from the MEDICAL RECORD NUMBER Referred by Dr. Luberta Mutter for RD OS LEE:  Ocular Hx- PMH-    CURRENT MEDICATIONS: Current Outpatient Medications (Ophthalmic Drugs)  Medication Sig  . neomycin-polymyxin b-dexamethasone (MAXITROL) 3.5-10000-0.1 OINT Place 1 application into the left eye 4 (four) times daily.   No current facility-administered medications for this visit. (Ophthalmic Drugs)   Current Outpatient Medications (Other)  Medication Sig  . aspirin EC 81 MG tablet Take 81 mg by mouth.  Marland Kitchen atorvastatin (LIPITOR) 10 MG tablet TAKE ONE TABLET 3 times weekly  . calcium-vitamin D (OSCAL WITH D) 500-200 MG-UNIT TABS tablet Take 1 tablet by mouth 2 (two) times daily.  . cloNIDine (CATAPRES) 0.1 MG tablet Take 0.1 mg by mouth as needed.   Marland Kitchen denosumab (PROLIA) 60 MG/ML SOSY injection Inject 60 mg into the skin every 6 (six) months.  . diclofenac Sodium (VOLTAREN) 1 % GEL Apply topically daily.  . Multiple Vitamin (MULTI-VITAMINS) TABS Take 1 tablet by mouth daily.   . Multiple Vitamins-Minerals (MULTIPLE VITAMINS/WOMENS PO) Take 1 tablet by mouth daily.  Marland Kitchen neomycin-polymyxin-hydrocortisone (CORTISPORIN) OTIC solution Apply 2-3 drops to the ingrown toenail site twice daily. Cover with band-aid.  Marland Kitchen nitroGLYCERIN (NITROSTAT) 0.4 MG SL tablet Place 1 tablet (0.4 mg total) under the tongue every 5 (five) minutes as needed for chest pain.  . Omega-3 Fatty Acids (FISH OIL) 1000 MG CAPS Take 1 capsule by mouth daily.  . polyethylene glycol powder  (GLYCOLAX/MIRALAX) powder MIX 17GMS (1 CAPFUL) IN 8OZ OF LIQUID AND DRINK ONCE DAILY  . psyllium (METAMUCIL) 58.6 % packet Take 1 packet by mouth daily.  Marland Kitchen spironolactone-hydrochlorothiazide (ALDACTAZIDE) 25-25 MG per tablet Take 1 tablet by mouth 3 (three) times a week.   . verapamil (CALAN-SR) 180 MG CR tablet TAKE ONE TABLET BY MOUTH TWICE DAILY   Current Facility-Administered Medications (Other)  Medication Route  . dexamethasone (DECADRON) injection 4 mg Intra-articular      REVIEW OF SYSTEMS:    ALLERGIES Allergies  Allergen Reactions  . Sulfa Antibiotics Nausea Only and Nausea And Vomiting    Other reaction(s): Vomiting Other reaction(s): Vomiting   . Codeine Itching  . Tape Itching  . Wound Dressing Adhesive Itching    PAST MEDICAL HISTORY Past Medical History:  Diagnosis Date  . Angina pectoris ([place]) 08/16/2015   Overview:  stable, no recurrence of her present meds and continue same with a CCB, aspirin and a statin. Stress echo negative for ischemia at 10 mets 08/23/13  . C. difficile colitis 06/10/2017  . Cataract    Mixed OU  . Coronary artery spasm ([place]) 08/16/2015  . Essential hypertension 08/16/2015  . Hyperlipidemia 08/16/2015  . Hypertension   . Hypertensive retinopathy    OU  . Migraine 06/10/2017  . 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 Osteoporosis   . PAT (paroxysmal atrial tachycardia) ([place]) 08/16/2015  . Retinal detachment    OS   Past Surgical History:  Procedure Laterality Date  . ABDOMINAL HYSTERECTOMY    . BUNIONECTOMY     reverse bunionectomy  . CARDIAC CATHETERIZATION    . CHOLECYSTECTOMY    . EYE SURGERY Left 12/24/2020   Pneumatic retinopexy for retinoschisis RD repair - Dr. Bernarda Caffey  . HAMMER TOE SURGERY    . 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 Bernarda Caffey    FAMILY  HISTORY Family History  Problem Relation Age of Onset  . Aneurysm Father   . Diabetes Maternal Grandmother     SOCIAL HISTORY Social History   Tobacco Use  . Smoking status: Never Smoker  . Smokeless tobacco: Never Used  Vaping Use  . Vaping Use: Never used  Substance Use Topics  . Alcohol use: Yes    Alcohol/week: 0.0 standard drinks    Comment: occasional  . Drug use: No         OPHTHALMIC EXAM:  Not recorded     IMAGING AND PROCEDURES  Imaging and Procedures for 01/08/2021         ASSESSMENT/PLAN:    ICD-10-CM   1. 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 Retinal edema  H35.81   4. Essential hypertension  I10   5. Hypertensive retinopathy of both eyes  H35.033   6. 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 Retinoschisis retinal detachment, OS - focal, peripheral schisis detachment from 0130-0330 - retinal hole at 0200 and +pigment along posterior border -- likely chronic - asymptomatic - s/p pneumatic retinopexy OS (04.05.22) - pt doing well with positioning - improvement in ST SRF on exam and OCT - cryo changes progressing - continue post procedure positioning -- face down, R head tilt - continue Maxitrol ung QID OS  - f/u Thursday, April 14 -- POV OS -- OCT through detachment  4,5. Hypertensive retinopathy OU - discussed importance of tight BP control - monitor  6. Mixed Cataract OU - The symptoms of cataract, surgical options, and treatments and risks were discussed with patient. - discussed diagnosis and progression - under the expert management of Dr. Ellie Lunch   Ophthalmic Meds Ordered this visit:  No orders of the defined types were placed in this encounter.      No follow-ups on file.  There are no Patient Instructions on file for this visit.   Explained the diagnoses, plan, and follow up with the patient and they expressed understanding.  Patient expressed understanding  of the importance of proper follow up care.   This document serves as a record of services personally performed by Gardiner Sleeper, MD, PhD. It was created on their behalf by Roselee Nova, COMT. The creation of this record is the provider's dictation and/or activities during the visit.  Electronically signed by: Roselee Nova, COMT 01/02/21 10:35 AM    Gardiner Sleeper, M.D., Ph.D. Diseases & Surgery of the Retina and Vitreous [facility]: M myopia (nearsighted); A astigmatism; H hyperopia (farsighted); P presbyopia; Mrx spectacle prescription;  CTL contact lenses; OD right eye; OS left eye; OU both eyes  XT exotropia; ET esotropia; PEK punctate epithelial keratitis; PEE punctate epithelial erosions; DES dry eye syndrome; MGD meibomian gland dysfunction; ATs artificial tears; PFAT's preservative free artificial tears; [place] nuclear sclerotic cataract; PSC posterior subcapsular cataract; ERM epi-retinal membrane; PVD posterior vitreous detachment; RD retinal detachment;  DM diabetes mellitus; DR diabetic retinopathy; NPDR non-proliferative diabetic retinopathy; PDR proliferative diabetic retinopathy; CSME clinically significant macular edema; DME diabetic macular edema; dbh dot blot hemorrhages; CWS cotton wool spot; POAG primary open angle glaucoma; C/D cup-to-disc ratio; HVF humphrey visual field; GVF goldmann visual field; OCT optical coherence tomography; IOP intraocular pressure; BRVO Branch retinal vein occlusion; CRVO central retinal vein occlusion; CRAO central retinal artery occlusion; BRAO branch retinal artery occlusion; RT retinal tear; SB scleral buckle; PPV pars plana vitrectomy; VH Vitreous hemorrhage; PRP panretinal laser photocoagulation; IVK intravitreal kenalog; VMT vitreomacular traction; MH Macular hole;  NVD neovascularization of the disc; NVE neovascularization elsewhere; AREDS age related eye disease study; ARMD age related macular  degeneration; POAG primary open angle glaucoma; EBMD epithelial/anterior basement membrane dystrophy; ACIOL anterior chamber intraocular lens; IOL intraocular lens; PCIOL posterior chamber intraocular lens; Phaco/IOL phacoemulsification with intraocular lens placement; [place] photorefractive keratectomy; LASIK laser assisted in situ keratomileusis; HTN hypertension; DM diabetes mellitus; COPD chronic obstructive pulmonary disease

## 2021-01-08 ENCOUNTER — Encounter (INDEPENDENT_AMBULATORY_CARE_PROVIDER_SITE_OTHER): Payer: Self-pay | Admitting: Ophthalmology

## 2021-01-08 ENCOUNTER — Ambulatory Visit (INDEPENDENT_AMBULATORY_CARE_PROVIDER_SITE_OTHER): Payer: Medicare PPO | Admitting: Ophthalmology

## 2021-01-08 ENCOUNTER — Other Ambulatory Visit: Payer: Self-pay

## 2021-01-08 DIAGNOSIS — H3581 Retinal edema: Secondary | ICD-10-CM | POA: Diagnosis not present

## 2021-01-08 DIAGNOSIS — I10 Essential (primary) hypertension: Secondary | ICD-10-CM | POA: Diagnosis not present

## 2021-01-08 DIAGNOSIS — H25813 Combined forms of age-related cataract, bilateral: Secondary | ICD-10-CM

## 2021-01-08 DIAGNOSIS — H35033 Hypertensive retinopathy, bilateral: Secondary | ICD-10-CM | POA: Diagnosis not present

## 2021-01-08 DIAGNOSIS — H33102 Unspecified retinoschisis, left eye: Secondary | ICD-10-CM

## 2021-01-08 DIAGNOSIS — H3322 Serous retinal detachment, left eye: Secondary | ICD-10-CM

## 2021-01-08 MED ORDER — NEOMYCIN-POLYMYXIN-DEXAMETH 3.5-10000-0.1 OP OINT: 1.0000 "application " | TOPICAL_OINTMENT | Freq: Every day | OPHTHALMIC | 0 refills | Status: DC

## 2021-01-08 NOTE — Progress Notes (Signed)
[facility] Note  01/08/2021     CHIEF COMPLAINT Patient presents for Retina Follow Up   HISTORY OF PRESENT ILLNESS: Melissa Lozano is a 80 y.o. female who presents to the clinic today for:   HPI    Retina Follow Up    Patient presents with  Retinal Break/Detachment.  In left eye.  This started 1 week ago.  I, the attending physician,  performed the HPI with the patient and updated documentation appropriately.          Comments    Patient here for 1 week retina follow up for RD OS. Patient states vision is ok. No eye pain.        Last edited by Bernarda Caffey, MD on 01/10/2021  1:04 AM. (History)    pt is getting "fed up" with positioning, she states if she has scar tissue, she is going to start sitting up and sleeping on her side, she doesn't feel like the gas bubble is any smaller  Referring physician: Luberta Mutter, MD [place],  [place] [address]  HISTORICAL INFORMATION:   Selected notes from the MEDICAL RECORD NUMBER Referred by Dr. Luberta Mutter for RD OS   CURRENT MEDICATIONS: Current Outpatient Medications (Ophthalmic Drugs)  Medication Sig  . neomycin-polymyxin b-dexamethasone (MAXITROL) 3.5-10000-0.1 OINT Place 1 application into the left eye at bedtime.   No current facility-administered medications for this visit. (Ophthalmic Drugs)   Current Outpatient Medications (Other)  Medication Sig  . aspirin EC 81 MG tablet Take 81 mg by mouth.  Marland Kitchen atorvastatin (LIPITOR) 10 MG tablet TAKE ONE TABLET 3 times weekly  . calcium-vitamin D (OSCAL WITH D) 500-200 MG-UNIT TABS tablet Take 1 tablet by mouth 2 (two) times daily.  . cloNIDine (CATAPRES) 0.1 MG tablet Take 0.1 mg by mouth as needed.   Marland Kitchen denosumab (PROLIA) 60 MG/ML SOSY injection Inject 60 mg into the skin every 6 (six) months.  . diclofenac Sodium (VOLTAREN) 1 % GEL Apply topically daily.  . Multiple Vitamin (MULTI-VITAMINS) TABS Take 1 tablet by mouth daily.   .  Multiple Vitamins-Minerals (MULTIPLE VITAMINS/WOMENS PO) Take 1 tablet by mouth daily.  Marland Kitchen neomycin-polymyxin-hydrocortisone (CORTISPORIN) OTIC solution Apply 2-3 drops to the ingrown toenail site twice daily. Cover with band-aid.  Marland Kitchen nitroGLYCERIN (NITROSTAT) 0.4 MG SL tablet Place 1 tablet (0.4 mg total) under the tongue every 5 (five) minutes as needed for chest pain.  . Omega-3 Fatty Acids (FISH OIL) 1000 MG CAPS Take 1 capsule by mouth daily.  . polyethylene glycol powder (GLYCOLAX/MIRALAX) powder MIX 17GMS (1 CAPFUL) IN 8OZ OF LIQUID AND DRINK ONCE DAILY  . psyllium (METAMUCIL) 58.6 % packet Take 1 packet by mouth daily.  Marland Kitchen spironolactone-hydrochlorothiazide (ALDACTAZIDE) 25-25 MG per tablet Take 1 tablet by mouth 3 (three) times a week.   . verapamil (CALAN-SR) 180 MG CR tablet TAKE ONE TABLET BY MOUTH TWICE DAILY   Current Facility-Administered Medications (Other)  Medication Route  . dexamethasone (DECADRON) injection 4 mg Intra-articular      REVIEW OF SYSTEMS: ROS    Positive for: Musculoskeletal, Cardiovascular, Eyes   Negative for: Constitutional, Gastrointestinal, Neurological, Skin, Genitourinary, HENT, Endocrine, Respiratory, Psychiatric, Allergic/Imm, Heme/Lymph   Last edited by Theodore Demark, COA on 01/08/2021  9:06 AM. (History)       ALLERGIES Allergies  Allergen Reactions  . Sulfa Antibiotics Nausea Only and Nausea And Vomiting    Other reaction(s): Vomiting Other reaction(s): Vomiting   .  Codeine Itching  . Tape Itching  . Wound Dressing Adhesive Itching    PAST MEDICAL HISTORY Past Medical History:  Diagnosis Date  . 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 Stress echo negative for ischemia at 10 mets 08/23/13  . C. difficile colitis 06/10/2017  . Cataract    Mixed OU  . 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 Essential hypertension 08/16/2015  .  Hyperlipidemia 08/16/2015  . Hypertension   . Hypertensive retinopathy    OU  . Migraine 06/10/2017  . 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 Osteoporosis   . PAT (paroxysmal atrial tachycardia) ([place]) 08/16/2015  . Retinal detachment    OS   Past Surgical History:  Procedure Laterality Date  . ABDOMINAL HYSTERECTOMY    . BUNIONECTOMY     reverse bunionectomy  . CARDIAC CATHETERIZATION    . CHOLECYSTECTOMY    . EYE SURGERY Left 12/24/2020   Pneumatic retinopexy for retinoschisis RD repair - Dr. Bernarda Caffey  . HAMMER TOE SURGERY    . 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 Bernarda Caffey    FAMILY HISTORY Family History  Problem Relation Age of Onset  . Aneurysm Father   . Diabetes Maternal Grandmother     SOCIAL HISTORY Social History   Tobacco Use  . Smoking status: Never Smoker  . Smokeless tobacco: Never Used  Vaping Use  . Vaping Use: Never used  Substance Use Topics  . Alcohol use: Yes    Alcohol/week: 0.0 standard drinks    Comment: occasional  . Drug use: No         OPHTHALMIC EXAM:  Base Eye Exam    Visual Acuity (Snellen - Linear)      Right Left   Dist cc 20/40 -2 20/30 -2   Dist ph cc 20/25 NI   Correction: Glasses       Tonometry (Tonopen, 9:03 AM)      Right Left   Pressure 11 13       Pupils      Dark Light Shape React APD   Right 4 3 Round Brisk None   Left 4 3 Round Brisk None       Visual Fields (Counting fingers)      Left Right    Full Full       Extraocular Movement      Right Left    Full Full       Neuro/Psych    Oriented x3: Yes   Mood/Affect: Normal       Dilation    Both eyes: 1.0% Mydriacyl, 2.5% Phenylephrine @ 9:02 AM  In OD I accidently put Phenylephrine 2.5%. I put both drops in OS [place]        Slit Lamp and Fundus Exam    External Exam      Right Left    External  periorbital erythema nasal       Slit Lamp Exam      Right Left   Lids/Lashes Dermatochalasis - upper lid Dermatochalasis - upper lid, periorbital errythema nasal   Conjunctiva/Sclera White and quiet Subconjunctival hemorrhage ST quad improving   Cornea arcus arcus   Anterior Chamber Deep and quiet Deep and quiet   Iris Round and dilated Round  and dilated   Lens 3+ Nuclear sclerosis, 3+ Cortical cataract 3+ Nuclear sclerosis, 3+ Cortical cataract   Vitreous Vitreous syneresis, no pigment, Posterior vitreous detachment Vitreous syneresis, trace pigment, Posterior vitreous detachment, vitreous condensations, ~10% gas bubble       Fundus Exam      Right Left   Disc Pink and Sharp, Compact Pink and Sharp, Compact   C/D Ratio 0.1 0.1   Macula Flat, Blunted foveal reflex, mild Drusen, RPE mottling and clumping, No heme or edema Flat, Good foveal reflex, RPE mottling and clumping, mild Drusen   Vessels mild attenuation, mild tortuousity attenuated, mild tortuousity   Periphery Attached; no RT/RD or heme ST SRF improved, cryo changes progressing; ORIGINALLY: schisis detachment from 0130-0400 with retinal hole at 0200; pigment deposition along posterior border of detachment - likely chronic        Refraction    Wearing Rx      Sphere Cylinder Axis Add Vert Prism   Right +0.50 +0.75 005 +2.75 2 BD   Left +0.25 +0.75 155 +2.75 2 BU          IMAGING AND PROCEDURES  Imaging and Procedures for 01/08/2021  OCT, Retina - OU - Both Eyes       Right Eye Quality was good. Central Foveal Thickness: 309. Progression has been stable. Findings include normal foveal contour, no IRF, no SRF, retinal drusen .   Left Eye Quality was good. Central Foveal Thickness: 321. Progression has been stable. Findings include normal foveal contour, epiretinal membrane, no SRF, no IRF (Stable improvement in SRF ST periphery, mild improvement in vitreous opacities).   Notes *Images captured and  stored on drive  Diagnosis / Impression:  OD: NFP, no IRF/SRF OS: NFP, no IRF/SRF centrally; Stable improvement in SRF ST periphery, mild improvement in vitreous opacities  Clinical management:  See below  Abbreviations: NFP - Normal foveal profile. CME - cystoid macular edema. PED - pigment epithelial detachment. IRF - intraretinal fluid. SRF - subretinal fluid. EZ - ellipsoid zone. ERM - epiretinal membrane. ORA - outer retinal atrophy. ORT - outer retinal tubulation. SRHM - subretinal hyper-reflective material. IRHM - intraretinal hyper-reflective material               ASSESSMENT/PLAN:    ICD-10-CM   1. 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 Retinal edema  H35.81 OCT, Retina - OU - Both Eyes  4. Essential hypertension  I10   5. Hypertensive retinopathy of both eyes  H35.033   6. 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 Hypertensive retinopathy OU - discussed importance of tight BP control - monitor  6. Mixed Cataract OU - The symptoms of cataract, surgical options, and treatments and risks were discussed with patient. - discussed diagnosis and progression - under the expert management of Dr. Ellie Lunch  Ophthalmic Meds Ordered this visit:  Meds ordered this encounter  Medications  .  neomycin-polymyxin b-dexamethasone (MAXITROL) 3.5-10000-0.1 OINT    Sig: Place 1 application into the left eye at bedtime.    Dispense:  3 g    Refill:  0       Return in about 6 days (around 01/14/2021) for POV.  There are no Patient Instructions on file for this visit.   Explained the diagnoses, plan, and follow up with the patient and they expressed understanding.  Patient expressed understanding of the importance of proper follow up care.   This document serves as a record of services personally performed by Gardiner Sleeper, MD, PhD. It was created on their behalf by San Jetty. Owens Shark, OA an ophthalmic technician. The creation of this record is the provider's dictation and/or activities during the visit.    Electronically signed by: San Jetty. [place], [place] 04.20.2022 1:06 AM  Gardiner Sleeper, M.D., Ph.D. Diseases & Surgery of the Retina and Vitreous [facility]  I have reviewed the above documentation for accuracy and completeness, and I agree with the above. Gardiner Sleeper, M.D., Ph.D. 01/10/21 1:08 AM   Abbreviations: M myopia (nearsighted); A astigmatism; H hyperopia (farsighted); P presbyopia; Mrx spectacle prescription;  CTL contact lenses; OD right eye; OS left eye; OU both eyes  XT exotropia; ET esotropia; PEK punctate epithelial keratitis; PEE punctate epithelial erosions; DES dry eye syndrome; MGD meibomian gland dysfunction; ATs artificial tears; PFAT's preservative free artificial tears; [place] nuclear sclerotic cataract; PSC posterior subcapsular cataract; ERM epi-retinal membrane; PVD posterior vitreous detachment; RD retinal detachment; DM diabetes mellitus; DR diabetic retinopathy; NPDR non-proliferative diabetic retinopathy; PDR proliferative diabetic retinopathy; CSME clinically significant macular edema; DME diabetic macular edema; dbh dot blot hemorrhages; CWS cotton wool spot; POAG primary open angle glaucoma; C/D cup-to-disc ratio; HVF humphrey visual  field; GVF goldmann visual field; OCT optical coherence tomography; IOP intraocular pressure; BRVO Branch retinal vein occlusion; CRVO central retinal vein occlusion; CRAO central retinal artery occlusion; BRAO branch retinal artery occlusion; RT retinal tear; SB scleral buckle; PPV pars plana vitrectomy; VH Vitreous hemorrhage; PRP panretinal laser photocoagulation; IVK intravitreal kenalog; VMT vitreomacular traction; MH Macular hole;  NVD neovascularization of the disc; NVE neovascularization elsewhere; AREDS age related eye disease study; ARMD age related macular degeneration; POAG primary open angle glaucoma; EBMD epithelial/anterior basement membrane dystrophy; ACIOL anterior chamber intraocular lens; IOL intraocular lens; PCIOL posterior chamber intraocular lens; Phaco/IOL phacoemulsification with intraocular lens placement; [place] photorefractive keratectomy; LASIK laser assisted in situ keratomileusis; HTN hypertension; DM diabetes mellitus; COPD chronic obstructive pulmonary disease

## 2021-01-10 ENCOUNTER — Encounter (INDEPENDENT_AMBULATORY_CARE_PROVIDER_SITE_OTHER): Payer: Self-pay | Admitting: Ophthalmology

## 2021-01-13 NOTE — Progress Notes (Signed)
[facility] Note  01/14/2021     CHIEF COMPLAINT Patient presents for Retina Follow Up   HISTORY OF PRESENT ILLNESS: Melissa Lozano is a 80 y.o. female who presents to the clinic today for:   HPI    Retina Follow Up    Patient presents with  Retinal Break/Detachment.  In left eye.  This started 6 days ago.  I, the attending physician,  performed the HPI with the patient and updated documentation appropriately.          Comments    Patient here for 6 days retina follow up for RD OS. Patient states vision doing good. Can see well. No eye pain.        Last edited by Bernarda Caffey, MD on 01/16/2021 10:56 PM. (History)      Referring physician: Luberta Mutter, MD [place],  [place] [id number]  HISTORICAL INFORMATION:   Selected notes from the MEDICAL RECORD NUMBER Referred by Dr. Luberta Mutter for RD OS   CURRENT MEDICATIONS: Current Outpatient Medications (Ophthalmic Drugs)  Medication Sig  . neomycin-polymyxin b-dexamethasone (MAXITROL) 3.5-10000-0.1 OINT Place 1 application into the left eye at bedtime.   No current facility-administered medications for this visit. (Ophthalmic Drugs)   Current Outpatient Medications (Other)  Medication Sig  . aspirin EC 81 MG tablet Take 81 mg by mouth.  Marland Kitchen atorvastatin (LIPITOR) 10 MG tablet TAKE ONE TABLET 3 times weekly  . calcium-vitamin D (OSCAL WITH D) 500-200 MG-UNIT TABS tablet Take 1 tablet by mouth 2 (two) times daily.  . cloNIDine (CATAPRES) 0.1 MG tablet Take 0.1 mg by mouth as needed.   Marland Kitchen denosumab (PROLIA) 60 MG/ML SOSY injection Inject 60 mg into the skin every 6 (six) months.  . diclofenac Sodium (VOLTAREN) 1 % GEL Apply topically daily.  . Multiple Vitamin (MULTI-VITAMINS) TABS Take 1 tablet by mouth daily.   . Multiple Vitamins-Minerals (MULTIPLE VITAMINS/WOMENS PO) Take 1 tablet by mouth daily.  Marland Kitchen neomycin-polymyxin-hydrocortisone (CORTISPORIN) OTIC solution Apply 2-3  drops to the ingrown toenail site twice daily. Cover with band-aid.  Marland Kitchen nitroGLYCERIN (NITROSTAT) 0.4 MG SL tablet Place 1 tablet (0.4 mg total) under the tongue every 5 (five) minutes as needed for chest pain.  . Omega-3 Fatty Acids (FISH OIL) 1000 MG CAPS Take 1 capsule by mouth daily.  . polyethylene glycol powder (GLYCOLAX/MIRALAX) powder MIX 17GMS (1 CAPFUL) IN 8OZ OF LIQUID AND DRINK ONCE DAILY  . psyllium (METAMUCIL) 58.6 % packet Take 1 packet by mouth daily.  Marland Kitchen spironolactone-hydrochlorothiazide (ALDACTAZIDE) 25-25 MG per tablet Take 1 tablet by mouth 3 (three) times a week.   . verapamil (CALAN-SR) 180 MG CR tablet TAKE ONE TABLET BY MOUTH TWICE DAILY   Current Facility-Administered Medications (Other)  Medication Route  . dexamethasone (DECADRON) injection 4 mg Intra-articular      REVIEW OF SYSTEMS: ROS    Positive for: Musculoskeletal, Cardiovascular, Eyes   Negative for: Constitutional, Gastrointestinal, Neurological, Skin, Genitourinary, HENT, Endocrine, Respiratory, Psychiatric, Allergic/Imm, Heme/Lymph   Last edited by Theodore Demark, COA on 01/14/2021  9:06 AM. (History)       ALLERGIES Allergies  Allergen Reactions  . Sulfa Antibiotics Nausea Only and Nausea And Vomiting    Other reaction(s): Vomiting Other reaction(s): Vomiting   . Codeine Itching  . Tape Itching  . Wound Dressing Adhesive Itching    PAST MEDICAL HISTORY Past Medical History:  Diagnosis Date  . 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 Stress echo negative for ischemia at 10 mets 08/23/13  . C. difficile colitis 06/10/2017  . Cataract    Mixed OU  . 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 Essential hypertension 08/16/2015  . Hyperlipidemia 08/16/2015  . Hypertension   . Hypertensive retinopathy    OU  . Migraine 06/10/2017  . Old MI (myocardial infarction) 06/10/2017   STEMI  September 2008, normal coronary arteriography, 0 CTCa Score    . Osteoporosis   . PAT (paroxysmal atrial tachycardia) ([place]) 08/16/2015  . Retinal detachment    OS   Past Surgical History:  Procedure Laterality Date  . ABDOMINAL HYSTERECTOMY    . BUNIONECTOMY     reverse bunionectomy  . CARDIAC CATHETERIZATION    . CHOLECYSTECTOMY    . EYE SURGERY Left 12/24/2020   Pneumatic retinopexy for retinoschisis RD repair - Dr. Bernarda Caffey  . HAMMER TOE SURGERY    . 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 Bernarda Caffey    FAMILY HISTORY Family History  Problem Relation Age of Onset  . Aneurysm Father   . Diabetes Maternal Grandmother     SOCIAL HISTORY Social History   Tobacco Use  . Smoking status: Never Smoker  . Smokeless tobacco: Never Used  Vaping Use  . Vaping Use: Never used  Substance Use Topics  . Alcohol use: Yes    Alcohol/week: 0.0 standard drinks    Comment: occasional  . Drug use: No         OPHTHALMIC EXAM:  Base Eye Exam    Visual Acuity (Snellen - Linear)      Right Left   Dist cc 20/30 -2 20/60 +1   Dist ph cc NI 20/50   Correction: Glasses       Tonometry (Tonopen, 9:03 AM)      Right Left   Pressure 10 11       Pupils      Dark Light Shape React APD   Right 4 3 Round Brisk None   Left 4 3 Round Brisk None       Visual Fields (Counting fingers)      Left Right    Full Full       Extraocular Movement      Right Left    Full Full       Neuro/Psych    Oriented x3: Yes   Mood/Affect: Normal       Dilation    Left eye: 1.0% Mydriacyl, 2.5% Phenylephrine @ 9:03 AM        Slit Lamp and Fundus Exam    Slit Lamp Exam      Right Left   Lids/Lashes Dermatochalasis - upper lid Dermatochalasis - upper lid, periorbital errythema nasal   Conjunctiva/Sclera White and quiet Subconjunctival hemorrhage ST quad improving   Cornea arcus arcus, trace PEE, trace guttata    Anterior Chamber Deep and quiet Deep and quiet   Iris Round and dilated Round and dilated   Lens 3+ Nuclear sclerosis, 3+ Cortical cataract 3+ Nuclear sclerosis, 3+ Cortical cataract   Vitreous Vitreous syneresis, no pigment, Posterior vitreous detachment Vitreous syneresis, trace pigment, Posterior vitreous detachment, vitreous condensations, ~5% gas bubble       Fundus Exam      Right Left   Disc Pink and Sharp, Compact Pink and Sharp, Compact  C/D Ratio 0.1 0.1   Macula Flat, Blunted foveal reflex, mild Drusen, RPE mottling and clumping, No heme or edema Flat, Good foveal reflex, RPE mottling and clumping, mild Drusen   Vessels mild attenuation, mild tortuousity attenuated, mild tortuousity   Periphery Attached; no RT/RD or heme ST SRF improved, good cryo changes progressing, no new RT/RD; ORIGINALLY: schisis detachment from 0130-0400 with retinal hole at 0200; pigment deposition along posterior border of detachment - likely chronic        Refraction    Wearing Rx      Sphere Cylinder Axis Add Vert Prism   Right +0.50 +0.75 005 +2.75 2 BD   Left +0.25 +0.75 155 +2.75 2 BU          IMAGING AND PROCEDURES  Imaging and Procedures for 01/14/2021  OCT, Retina - OU - Both Eyes       Right Eye Quality was good. Central Foveal Thickness: 304. Progression has been stable. Findings include normal foveal contour, no IRF, no SRF, retinal drusen .   Left Eye Quality was good. Central Foveal Thickness: 317. Progression has been stable. Findings include normal foveal contour, epiretinal membrane, no SRF, no IRF (Stable improvement in SRF ST periphery, mild improvement in vitreous opacities).   Notes *Images captured and stored on drive  Diagnosis / Impression:  OD: NFP, no IRF/SRF OS: NFP, no IRF/SRF centrally; Stable improvement in SRF ST periphery, mild improvement in vitreous opacities  Clinical management:  See below  Abbreviations: NFP - Normal foveal profile. CME - cystoid  macular edema. PED - pigment epithelial detachment. IRF - intraretinal fluid. SRF - subretinal fluid. EZ - ellipsoid zone. ERM - epiretinal membrane. ORA - outer retinal atrophy. ORT - outer retinal tubulation. SRHM - subretinal hyper-reflective material. IRHM - intraretinal hyper-reflective material               ASSESSMENT/PLAN:    ICD-10-CM   1. 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 Retinal edema  H35.81 OCT, Retina - OU - Both Eyes  4. Essential hypertension  I10   5. Hypertensive retinopathy of both eyes  H35.033   6. 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 Hypertensive retinopathy OU - discussed importance of tight BP control - monitor  6. Mixed Cataract OU - The symptoms of cataract, surgical options, and treatments and risks were discussed with patient. - discussed diagnosis and progression - under the expert management of Dr. Ellie Lunch   Ophthalmic Meds Ordered this visit:  No orders of the defined types were placed in this encounter.      Return in about 1 week (around 01/21/2021) for POV, RD OS.  There are no Patient Instructions on file for this visit.   Explained the diagnoses, plan, and follow up with the patient and they expressed understanding.  Patient expressed understanding of  the importance of proper follow  up care.   This document serves as a record of services personally performed by Gardiner Sleeper, MD, PhD. It was created on their behalf by Estill Bakes, COT an ophthalmic technician. The creation of this record is the provider's dictation and/or activities during the visit.    Electronically signed by: Estill Bakes, COT 4.25.22 @ 10:56 PM   This document serves as a record of services personally performed by Gardiner Sleeper, MD, PhD. It was created on their behalf by San Jetty. Owens Shark, OA an ophthalmic technician. The creation of this record is the provider's dictation and/or activities during the visit.    Electronically signed by: San Jetty. Owens Shark, [place] 04.26.2022 10:56 PM.  Gardiner Sleeper, M.D., Ph.D. Diseases & Surgery of the Retina and [place] 01/14/2021   I have reviewed the above documentation for accuracy and completeness, and I agree with the above. Gardiner Sleeper, M.D., Ph.D. 01/16/21 10:57 PM   Abbreviations: M myopia (nearsighted); A astigmatism; H hyperopia (farsighted); P presbyopia; Mrx spectacle prescription;  CTL contact lenses; OD right eye; OS left eye; OU both eyes  XT exotropia; ET esotropia; PEK punctate epithelial keratitis; PEE punctate epithelial erosions; DES dry eye syndrome; MGD meibomian gland dysfunction; ATs artificial tears; PFAT's preservative free artificial tears; [place] nuclear sclerotic cataract; PSC posterior subcapsular cataract; ERM epi-retinal membrane; PVD posterior vitreous detachment; RD retinal detachment; DM diabetes mellitus; DR diabetic retinopathy; NPDR non-proliferative diabetic retinopathy; PDR proliferative diabetic retinopathy; CSME clinically significant macular edema; DME diabetic macular edema; dbh dot blot hemorrhages; CWS cotton wool spot; POAG primary open angle glaucoma; C/D cup-to-disc ratio; HVF humphrey visual field; GVF goldmann visual field; OCT optical coherence tomography; IOP intraocular pressure;  BRVO Branch retinal vein occlusion; CRVO central retinal vein occlusion; CRAO central retinal artery occlusion; BRAO branch retinal artery occlusion; RT retinal tear; SB scleral buckle; PPV pars plana vitrectomy; VH Vitreous hemorrhage; PRP panretinal laser photocoagulation; IVK intravitreal kenalog; VMT vitreomacular traction; MH Macular hole;  NVD neovascularization of the disc; NVE neovascularization elsewhere; AREDS age related eye disease study; ARMD age related macular degeneration; POAG primary open angle glaucoma; EBMD epithelial/anterior basement membrane dystrophy; ACIOL anterior chamber intraocular lens; IOL intraocular lens; PCIOL posterior chamber intraocular lens; Phaco/IOL phacoemulsification with intraocular lens placement; [place] photorefractive keratectomy; LASIK laser assisted in situ keratomileusis; HTN hypertension; DM diabetes mellitus; COPD chronic obstructive pulmonary disease

## 2021-01-14 ENCOUNTER — Encounter (INDEPENDENT_AMBULATORY_CARE_PROVIDER_SITE_OTHER): Payer: Self-pay | Admitting: Ophthalmology

## 2021-01-14 ENCOUNTER — Other Ambulatory Visit: Payer: Self-pay

## 2021-01-14 ENCOUNTER — Ambulatory Visit (INDEPENDENT_AMBULATORY_CARE_PROVIDER_SITE_OTHER): Payer: Medicare PPO | Admitting: Ophthalmology

## 2021-01-14 DIAGNOSIS — H3322 Serous retinal detachment, left eye: Secondary | ICD-10-CM

## 2021-01-14 DIAGNOSIS — H25813 Combined forms of age-related cataract, bilateral: Secondary | ICD-10-CM

## 2021-01-14 DIAGNOSIS — H3581 Retinal edema: Secondary | ICD-10-CM | POA: Diagnosis not present

## 2021-01-14 DIAGNOSIS — H33102 Unspecified retinoschisis, left eye: Secondary | ICD-10-CM

## 2021-01-14 DIAGNOSIS — I10 Essential (primary) hypertension: Secondary | ICD-10-CM

## 2021-01-14 DIAGNOSIS — H35033 Hypertensive retinopathy, bilateral: Secondary | ICD-10-CM

## 2021-01-16 ENCOUNTER — Encounter (INDEPENDENT_AMBULATORY_CARE_PROVIDER_SITE_OTHER): Payer: Self-pay | Admitting: Ophthalmology

## 2021-01-20 NOTE — Progress Notes (Signed)
[facility] Note  01/21/2021     CHIEF COMPLAINT Patient presents for Retina Follow Up   HISTORY OF PRESENT ILLNESS: Melissa Lozano is a 80 y.o. female who presents to the clinic today for:   HPI    Retina Follow Up    Patient presents with  Retinal Break/Detachment.  In left eye.  This started 1 week ago.  I, the attending physician,  performed the HPI with the patient and updated documentation appropriately.          Comments    Patient here for 1 week retina follow up for POV OS. Patient states vision doing ok. Vision still not there yet. No eye pain.        Last edited by Bernarda Caffey, MD on 01/24/2021 10:09 PM. (History)      Referring physician: Luberta Mutter, MD [place],  [place] [id number]  HISTORICAL INFORMATION:   Selected notes from the MEDICAL RECORD NUMBER Referred by Dr. Luberta Mutter for RD OS   CURRENT MEDICATIONS: Current Outpatient Medications (Ophthalmic Drugs)  Medication Sig  . neomycin-polymyxin b-dexamethasone (MAXITROL) 3.5-10000-0.1 OINT Place 1 application into the left eye at bedtime.   No current facility-administered medications for this visit. (Ophthalmic Drugs)   Current Outpatient Medications (Other)  Medication Sig  . aspirin EC 81 MG tablet Take 81 mg by mouth.  Marland Kitchen atorvastatin (LIPITOR) 10 MG tablet TAKE ONE TABLET 3 times weekly  . calcium-vitamin D (OSCAL WITH D) 500-200 MG-UNIT TABS tablet Take 1 tablet by mouth 2 (two) times daily.  . cloNIDine (CATAPRES) 0.1 MG tablet Take 0.1 mg by mouth as needed.   Marland Kitchen denosumab (PROLIA) 60 MG/ML SOSY injection Inject 60 mg into the skin every 6 (six) months.  . diclofenac Sodium (VOLTAREN) 1 % GEL Apply topically daily.  . Multiple Vitamin (MULTI-VITAMINS) TABS Take 1 tablet by mouth daily.   . Multiple Vitamins-Minerals (MULTIPLE VITAMINS/WOMENS PO) Take 1 tablet by mouth daily.  Marland Kitchen neomycin-polymyxin-hydrocortisone (CORTISPORIN) OTIC solution  Apply 2-3 drops to the ingrown toenail site twice daily. Cover with band-aid.  Marland Kitchen nitroGLYCERIN (NITROSTAT) 0.4 MG SL tablet Place 1 tablet (0.4 mg total) under the tongue every 5 (five) minutes as needed for chest pain.  . Omega-3 Fatty Acids (FISH OIL) 1000 MG CAPS Take 1 capsule by mouth daily.  . polyethylene glycol powder (GLYCOLAX/MIRALAX) powder MIX 17GMS (1 CAPFUL) IN 8OZ OF LIQUID AND DRINK ONCE DAILY  . psyllium (METAMUCIL) 58.6 % packet Take 1 packet by mouth daily.  Marland Kitchen spironolactone-hydrochlorothiazide (ALDACTAZIDE) 25-25 MG per tablet Take 1 tablet by mouth 3 (three) times a week.   . verapamil (CALAN-SR) 180 MG CR tablet TAKE ONE TABLET BY MOUTH TWICE DAILY   Current Facility-Administered Medications (Other)  Medication Route  . dexamethasone (DECADRON) injection 4 mg Intra-articular      REVIEW OF SYSTEMS: ROS    Positive for: Musculoskeletal, Cardiovascular, Eyes   Negative for: Constitutional, Gastrointestinal, Neurological, Skin, Genitourinary, HENT, Endocrine, Respiratory, Psychiatric, Allergic/Imm, Heme/Lymph   Last edited by Theodore Demark, COA on 01/21/2021  9:21 AM. (History)       ALLERGIES Allergies  Allergen Reactions  . Sulfa Antibiotics Nausea Only and Nausea And Vomiting    Other reaction(s): Vomiting Other reaction(s): Vomiting   . Codeine Itching  . Tape Itching  . Wound Dressing Adhesive Itching    PAST MEDICAL HISTORY Past Medical History:  Diagnosis Date  . 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 Stress echo negative for ischemia at 10 mets 08/23/13  . C. difficile colitis 06/10/2017  . Cataract    Mixed OU  . 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 Essential hypertension 08/16/2015  . Hyperlipidemia 08/16/2015  . Hypertension   . Hypertensive retinopathy    OU  . Migraine 06/10/2017  . Old MI (myocardial infarction) 06/10/2017    STEMI September 2008, normal coronary arteriography, 0 CTCa Score    . Osteoporosis   . PAT (paroxysmal atrial tachycardia) ([place]) 08/16/2015  . Retinal detachment    OS   Past Surgical History:  Procedure Laterality Date  . ABDOMINAL HYSTERECTOMY    . BUNIONECTOMY     reverse bunionectomy  . CARDIAC CATHETERIZATION    . CHOLECYSTECTOMY    . EYE SURGERY Left 12/24/2020   Pneumatic retinopexy for retinoschisis RD repair - Dr. Bernarda Caffey  . HAMMER TOE SURGERY    . 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 Bernarda Caffey    FAMILY HISTORY Family History  Problem Relation Age of Onset  . Aneurysm Father   . Diabetes Maternal Grandmother     SOCIAL HISTORY Social History   Tobacco Use  . Smoking status: Never Smoker  . Smokeless tobacco: Never Used  Vaping Use  . Vaping Use: Never used  Substance Use Topics  . Alcohol use: Yes    Alcohol/week: 0.0 standard drinks    Comment: occasional  . Drug use: No         OPHTHALMIC EXAM:  Base Eye Exam    Visual Acuity (Snellen - Linear)      Right Left   Dist cc 20/30 -1 20/50 -1   Dist ph cc 20/25 -2 20/30 -1   Correction: Glasses       Tonometry (Tonopen, 9:18 AM)      Right Left   Pressure 11 12       Pupils      Dark Light Shape React APD   Right 4 3 Round Brisk None   Left 4 3 Round Brisk None       Visual Fields (Counting fingers)      Left Right    Full Full       Extraocular Movement      Right Left    Full Full       Neuro/Psych    Oriented x3: Yes   Mood/Affect: Normal       Dilation    Both eyes: 1.0% Mydriacyl, 2.5% Phenylephrine @ 9:18 AM        Slit Lamp and Fundus Exam    Slit Lamp Exam      Right Left   Lids/Lashes Dermatochalasis - upper lid Dermatochalasis - upper lid, periorbital errythema nasal   Conjunctiva/Sclera White and quiet Subconjunctival hemorrhage ST quad improving   Cornea arcus arcus, trace PEE, trace  guttata   Anterior Chamber Deep and quiet Deep and quiet   Iris Round and dilated Round and dilated   Lens 3+ Nuclear sclerosis, 3+ Cortical cataract 3+ Nuclear sclerosis, 3+ Cortical cataract   Vitreous Vitreous syneresis, no pigment, Posterior vitreous detachment Vitreous syneresis, trace pigment, Posterior vitreous detachment, vitreous condensations, ~5% gas bubble       Fundus Exam      Right Left   Disc Pink and Sharp, Compact Pink and  Sharp, Compact   C/D Ratio 0.1 0.1   Macula Flat, Blunted foveal reflex, mild Drusen, RPE mottling and clumping, No heme or edema Flat, Good foveal reflex, RPE mottling and clumping, mild Drusen, trace ERM   Vessels mild attenuation, mild tortuousity attenuated, mild tortuousity   Periphery Attached; no RT/RD or heme Attached, ST SRF improved, good cryo changes progressing, no new RT/RD, partially pigmented demarcation line at posterior border of detachment; ORIGINALLY: schisis detachment from 0130-0400 with retinal hole at 0200; pigment deposition along posterior border of detachment - likely chronic        Refraction    Wearing Rx      Sphere Cylinder Axis Add Vert Prism   Right +0.50 +0.75 005 +2.75 2 BD   Left +0.25 +0.75 155 +2.75 2 BU          IMAGING AND PROCEDURES  Imaging and Procedures for 01/21/2021  OCT, Retina - OU - Both Eyes       Right Eye Quality was good. Central Foveal Thickness: 304. Progression has been stable. Findings include normal foveal contour, no IRF, no SRF, retinal drusen .   Left Eye Quality was good. Central Foveal Thickness: 317. Progression has been stable. Findings include normal foveal contour, epiretinal membrane, no SRF, no IRF (Stable improvement in SRF ST periphery, mild improvement in vitreous opacities).   Notes *Images captured and stored on drive  Diagnosis / Impression:  OD: NFP, no IRF/SRF OS: NFP, no IRF/SRF centrally; Stable improvement in SRF ST periphery, mild improvement in vitreous  opacities  Clinical management:  See below  Abbreviations: NFP - Normal foveal profile. CME - cystoid macular edema. PED - pigment epithelial detachment. IRF - intraretinal fluid. SRF - subretinal fluid. EZ - ellipsoid zone. ERM - epiretinal membrane. ORA - outer retinal atrophy. ORT - outer retinal tubulation. SRHM - subretinal hyper-reflective material. IRHM - intraretinal hyper-reflective material               ASSESSMENT/PLAN:    ICD-10-CM   1. 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 Retinal edema  H35.81 OCT, Retina - OU - Both Eyes  4. Essential hypertension  I10   5. Hypertensive retinopathy of both eyes  H35.033   6. 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 Hypertensive retinopathy OU - discussed importance of tight BP control - monitor  6. Mixed Cataract OU - The symptoms of cataract, surgical options, and treatments and risks were discussed with patient. - discussed diagnosis and progression - under the expert management of Dr. Ellie Lunch   Ophthalmic Meds Ordered this visit:  No orders of the defined types were placed in this encounter.      Return for f/u RD OS, DFE, OCT.  There are no Patient Instructions on file for this visit.   Explained the diagnoses, plan, and follow up  with the patient and they expressed  understanding.  Patient expressed understanding of the importance of proper follow up care.   This document serves as a record of services personally performed by Gardiner Sleeper, MD, PhD. It was created on their behalf by Estill Bakes, COT an ophthalmic technician. The creation of this record is the provider's dictation and/or activities during the visit.    Electronically signed by: Estill Bakes, COT 5.2.22 @ 10:11 PM   This document serves as a record of services personally performed by Gardiner Sleeper, MD, PhD. It was created on their behalf by San Jetty. Owens Shark, OA an ophthalmic technician. The creation of this record is the provider's dictation and/or activities during the visit.    Electronically signed by: San Jetty. Owens Shark, [place] 05.03.2022 10:11 PM  Gardiner Sleeper, M.D., Ph.D. Diseases & Surgery of the Retina and Vitreous [facility]  I have reviewed the above documentation for accuracy and completeness, and I agree with the above. Gardiner Sleeper, M.D., Ph.D. 01/24/21 10:11 PM   Abbreviations: M myopia (nearsighted); A astigmatism; H hyperopia (farsighted); P presbyopia; Mrx spectacle prescription;  CTL contact lenses; OD right eye; OS left eye; OU both eyes  XT exotropia; ET esotropia; PEK punctate epithelial keratitis; PEE punctate epithelial erosions; DES dry eye syndrome; MGD meibomian gland dysfunction; ATs artificial tears; PFAT's preservative free artificial tears; [place] nuclear sclerotic cataract; PSC posterior subcapsular cataract; ERM epi-retinal membrane; PVD posterior vitreous detachment; RD retinal detachment; DM diabetes mellitus; DR diabetic retinopathy; NPDR non-proliferative diabetic retinopathy; PDR proliferative diabetic retinopathy; CSME clinically significant macular edema; DME diabetic macular edema; dbh dot blot hemorrhages; CWS cotton wool spot; POAG primary open angle glaucoma; C/D cup-to-disc ratio; HVF humphrey visual field; GVF goldmann visual  field; OCT optical coherence tomography; IOP intraocular pressure; BRVO Branch retinal vein occlusion; CRVO central retinal vein occlusion; CRAO central retinal artery occlusion; BRAO branch retinal artery occlusion; RT retinal tear; SB scleral buckle; PPV pars plana vitrectomy; VH Vitreous hemorrhage; PRP panretinal laser photocoagulation; IVK intravitreal kenalog; VMT vitreomacular traction; MH Macular hole;  NVD neovascularization of the disc; NVE neovascularization elsewhere; AREDS age related eye disease study; ARMD age related macular degeneration; POAG primary open angle glaucoma; EBMD epithelial/anterior basement membrane dystrophy; ACIOL anterior chamber intraocular lens; IOL intraocular lens; PCIOL posterior chamber intraocular lens; Phaco/IOL phacoemulsification with intraocular lens placement; [place] photorefractive keratectomy; LASIK laser assisted in situ keratomileusis; HTN hypertension; DM diabetes mellitus; COPD chronic obstructive pulmonary disease

## 2021-01-21 ENCOUNTER — Encounter (INDEPENDENT_AMBULATORY_CARE_PROVIDER_SITE_OTHER): Payer: Self-pay | Admitting: Ophthalmology

## 2021-01-21 ENCOUNTER — Ambulatory Visit (INDEPENDENT_AMBULATORY_CARE_PROVIDER_SITE_OTHER): Payer: Medicare PPO | Admitting: Ophthalmology

## 2021-01-21 ENCOUNTER — Other Ambulatory Visit: Payer: Self-pay

## 2021-01-21 DIAGNOSIS — H3581 Retinal edema: Secondary | ICD-10-CM | POA: Diagnosis not present

## 2021-01-21 DIAGNOSIS — H35033 Hypertensive retinopathy, bilateral: Secondary | ICD-10-CM

## 2021-01-21 DIAGNOSIS — H3322 Serous retinal detachment, left eye: Secondary | ICD-10-CM | POA: Diagnosis not present

## 2021-01-21 DIAGNOSIS — H33102 Unspecified retinoschisis, left eye: Secondary | ICD-10-CM | POA: Diagnosis not present

## 2021-01-21 DIAGNOSIS — I10 Essential (primary) hypertension: Secondary | ICD-10-CM

## 2021-01-21 DIAGNOSIS — H25813 Combined forms of age-related cataract, bilateral: Secondary | ICD-10-CM

## 2021-01-24 ENCOUNTER — Encounter (INDEPENDENT_AMBULATORY_CARE_PROVIDER_SITE_OTHER): Payer: Self-pay | Admitting: Ophthalmology

## 2021-01-27 NOTE — Progress Notes (Signed)
[facility] Note  01/28/2021     CHIEF COMPLAINT Patient presents for Post-op Follow-up   HISTORY OF PRESENT ILLNESS: JENNY LAI is a 80 y.o. female who presents to the clinic today for:   HPI    Post-op Follow-up    In left eye.  Vision is improved.  I, the attending physician,  performed the HPI with the patient and updated documentation appropriately.          Comments    Pt states her vision is improved and gas bubble is gone OS.  Pt denies eye pain or discomfort.  Pt denies any new or worsening floaters or fol OU.       Last edited by Bernarda Caffey, MD on 01/29/2021  4:52 PM. (History)    pt states gas bubble is gone  Referring physician: Luberta Mutter, MD [place],  [place] [address]  HISTORICAL INFORMATION:   Selected notes from the MEDICAL RECORD NUMBER Referred by Dr. Luberta Mutter for RD OS   CURRENT MEDICATIONS: Current Outpatient Medications (Ophthalmic Drugs)  Medication Sig  . neomycin-polymyxin b-dexamethasone (MAXITROL) 3.5-10000-0.1 OINT Place 1 application into the left eye at bedtime.   No current facility-administered medications for this visit. (Ophthalmic Drugs)   Current Outpatient Medications (Other)  Medication Sig  . aspirin EC 81 MG tablet Take 81 mg by mouth.  Marland Kitchen atorvastatin (LIPITOR) 10 MG tablet TAKE ONE TABLET 3 times weekly  . calcium-vitamin D (OSCAL WITH D) 500-200 MG-UNIT TABS tablet Take 1 tablet by mouth 2 (two) times daily.  . cloNIDine (CATAPRES) 0.1 MG tablet Take 0.1 mg by mouth as needed.   Marland Kitchen denosumab (PROLIA) 60 MG/ML SOSY injection Inject 60 mg into the skin every 6 (six) months.  . diclofenac Sodium (VOLTAREN) 1 % GEL Apply topically daily.  . Multiple Vitamin (MULTI-VITAMINS) TABS Take 1 tablet by mouth daily.   . Multiple Vitamins-Minerals (MULTIPLE VITAMINS/WOMENS PO) Take 1 tablet by mouth daily.  Marland Kitchen neomycin-polymyxin-hydrocortisone (CORTISPORIN) OTIC solution Apply  2-3 drops to the ingrown toenail site twice daily. Cover with band-aid.  Marland Kitchen nitroGLYCERIN (NITROSTAT) 0.4 MG SL tablet Place 1 tablet (0.4 mg total) under the tongue every 5 (five) minutes as needed for chest pain.  . Omega-3 Fatty Acids (FISH OIL) 1000 MG CAPS Take 1 capsule by mouth daily.  . polyethylene glycol powder (GLYCOLAX/MIRALAX) powder MIX 17GMS (1 CAPFUL) IN 8OZ OF LIQUID AND DRINK ONCE DAILY  . psyllium (METAMUCIL) 58.6 % packet Take 1 packet by mouth daily.  Marland Kitchen spironolactone-hydrochlorothiazide (ALDACTAZIDE) 25-25 MG per tablet Take 1 tablet by mouth 3 (three) times a week.   . verapamil (CALAN-SR) 180 MG CR tablet TAKE ONE TABLET BY MOUTH TWICE DAILY   Current Facility-Administered Medications (Other)  Medication Route  . dexamethasone (DECADRON) injection 4 mg Intra-articular      REVIEW OF SYSTEMS: ROS    Positive for: Musculoskeletal, Cardiovascular, Eyes   Negative for: Constitutional, Gastrointestinal, Neurological, Skin, Genitourinary, HENT, Endocrine, Respiratory, Psychiatric, Allergic/Imm, Heme/Lymph   Last edited by Doneen Poisson on 01/28/2021  8:28 AM. (History)       ALLERGIES Allergies  Allergen Reactions  . Sulfa Antibiotics Nausea Only and Nausea And Vomiting    Other reaction(s): Vomiting Other reaction(s): Vomiting   . Codeine Itching  . Tape Itching  . Wound Dressing Adhesive Itching    PAST MEDICAL HISTORY Past Medical History:  Diagnosis Date  . Angina pectoris ([place]) 08/16/2015  Overview:  stable, no recurrence of her present meds and continue same with a CCB, aspirin and a statin. Stress echo negative for ischemia at 10 mets 08/23/13  . C. difficile colitis 06/10/2017  . Cataract    Mixed OU  . Coronary artery spasm ([facility]) 08/16/2015  . Essential hypertension 08/16/2015  . Hyperlipidemia 08/16/2015  . Hypertension   . Hypertensive retinopathy    OU  . Migraine 06/10/2017  . Old MI (myocardial infarction) 06/10/2017   STEMI  September 2008, normal coronary arteriography, 0 CTCa Score    . Osteoporosis   . PAT (paroxysmal atrial tachycardia) (HCC) 08/16/2015  . Retinal detachment    OS   Past Surgical History:  Procedure Laterality Date  . ABDOMINAL HYSTERECTOMY    . BUNIONECTOMY     reverse bunionectomy  . CARDIAC CATHETERIZATION    . CHOLECYSTECTOMY    . EYE SURGERY Left 12/24/2020   Pneumatic retinopexy for retinoschisis RD repair - Dr. Rennis ChrisBrian Giannamarie Paulus  . HAMMER TOE SURGERY    . 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 Rennis ChrisBrian Davielle Lingelbach    FAMILY HISTORY Family History  Problem Relation Age of Onset  . Aneurysm Father   . Diabetes Maternal Grandmother     SOCIAL HISTORY Social History   Tobacco Use  . Smoking status: Never Smoker  . Smokeless tobacco: Never Used  Vaping Use  . Vaping Use: Never used  Substance Use Topics  . Alcohol use: Yes    Alcohol/week: 0.0 standard drinks    Comment: occasional  . Drug use: No         OPHTHALMIC EXAM:  Base Eye Exam    Visual Acuity (Snellen - Linear)      Right Left   Dist cc 20/30 -2 20/40 -1   Dist ph cc 20/25 -2 NI   Correction: Glasses       Tonometry (Tonopen, 8:34 AM)      Right Left   Pressure 14 15       Pupils      Dark Light Shape React APD   Right 3 2 Round Brisk 0   Left 3 2 Round Brisk 0       Visual Fields      Left Right    Full Full       Extraocular Movement      Right Left    Full Full       Neuro/Psych    Oriented x3: Yes   Mood/Affect: Normal       Dilation    Left eye: 1.0% Mydriacyl, 2.5% Phenylephrine @ 8:34 AM        Slit Lamp and Fundus Exam    Slit Lamp Exam      Right Left   Lids/Lashes Dermatochalasis - upper lid Dermatochalasis - upper lid, periorbital errythema nasal   Conjunctiva/Sclera White and quiet Subconjunctival hemorrhage ST quad improving   Cornea arcus, 3+ Guttata arcus, trace PEE, 2+ guttata, mild tear film  debris   Anterior Chamber Deep and quiet Deep and quiet   Iris Round and dilated Round and dilated   Lens 3+ Nuclear sclerosis, 3+ Cortical cataract 3+ Nuclear sclerosis, 3+ Cortical cataract   Vitreous Vitreous syneresis, no pigment, Posterior vitreous detachment Vitreous syneresis, trace pigment, Posterior vitreous detachment, vitreous condensations and debris settling inferiorly, gas bubble gone       Fundus Exam      Right Left  Disc Pink and Sharp, Compact Pink and Sharp, Compact   C/D Ratio 0.1 0.1   Macula Flat, Blunted foveal reflex, mild Drusen, RPE mottling and clumping, No heme or edema Flat, Good foveal reflex, RPE mottling and clumping, mild Drusen, trace ERM   Vessels mild attenuation, mild tortuousity attenuated, mild tortuousity   Periphery Attached; no RT/RD or heme Attached, ST SRF stably resolved, good cryo changes in place, no new RT/RD, partially pigmented demarcation line at posterior border of detachment; ORIGINALLY: schisis detachment from 0130-0400 with retinal hole at 0200; pigment deposition along posterior border of detachment - likely chronic        Refraction    Wearing Rx      Sphere Cylinder Axis Add Vert Prism   Right +0.50 +0.75 005 +2.75 2 BD   Left +0.25 +0.75 155 +2.75 2 BU          IMAGING AND PROCEDURES  Imaging and Procedures for 01/28/2021  OCT, Retina - OU - Both Eyes       Right Eye Quality was good. Central Foveal Thickness: 307. Progression has been stable. Findings include normal foveal contour, no IRF, no SRF, retinal drusen .   Left Eye Quality was good. Central Foveal Thickness: 319. Progression has been stable. Findings include normal foveal contour, epiretinal membrane, no SRF, no IRF (Stable improvement in SRF ST periphery, mild improvement in vitreous opacities).   Notes *Images captured and stored on drive  Diagnosis / Impression:  OD: NFP, no IRF/SRF OS: NFP, no IRF/SRF centrally; Stable improvement in SRF ST  periphery, mild improvement in vitreous opacities  Clinical management:  See below  Abbreviations: NFP - Normal foveal profile. CME - cystoid macular edema. PED - pigment epithelial detachment. IRF - intraretinal fluid. SRF - subretinal fluid. EZ - ellipsoid zone. ERM - epiretinal membrane. ORA - outer retinal atrophy. ORT - outer retinal tubulation. SRHM - subretinal hyper-reflective material. IRHM - intraretinal hyper-reflective material               ASSESSMENT/PLAN:    ICD-10-CM   1. 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 Retinal edema  H35.81 OCT, Retina - OU - Both Eyes  4. Essential hypertension  I10   5. Hypertensive retinopathy of both eyes  H35.033   6. Combined forms of age-related cataract of both eyes  H25.813    1-3. Retinoschisis retinal detachment, OS - focal, peripheral schisis detachment from 0130-0330 - retinal hole at 0200 and +pigment along posterior border -- likely chronic - asymptomatic - s/p pneumatic retinopexy OS (04.05.22) - retina reattached w/ good cryo changes in place, ST quad - OCT shows stable improvement in SRF ST periphery, mild improvement in vitreous opacities - gas bubble now gone - BCVA 20/40 - can d/c positioning and begin resuming normal activities - f/u 3 wks -- POV OS -- OCT through detachment  4,5. Hypertensive retinopathy OU - discussed importance of tight BP control - monitor  6. Mixed Cataract OU - The symptoms of cataract, surgical options, and treatments and risks were discussed with patient. - discussed diagnosis and progression - under the expert management of Dr. Ellie Lunch - now clear from a retina standpoint to proceed with cataract surgery when pt and surgeon are ready  Ophthalmic Meds Ordered this visit:  No orders of the defined types were placed in this encounter.     Return in about 3 weeks (around 02/18/2021) for f/u RD OS, DFE, OCT.  There are no Patient Instructions on file  for this visit.   This document serves as a record of services personally performed by Gardiner Sleeper, MD, PhD. It was created on their behalf by Leeann Must, [place], an ophthalmic technician. The creation of this record is the provider's dictation and/or activities during the visit.    Electronically signed by: Leeann Must, COA @TODAY @ 4:57 PM   This document serves as a record of services personally performed by Gardiner Sleeper, MD, PhD. It was created on their behalf by San Jetty. Owens Shark, OA an ophthalmic technician. The creation of this record is the provider's dictation and/or activities during the visit.    Electronically signed by: San Jetty. Owens Shark, [place] 05.10.2022 4:57 PM  Gardiner Sleeper, M.D., Ph.D. Diseases & Surgery of the Retina and [place] 01/28/2021   I have reviewed the above documentation for accuracy and completeness, and I agree with the above. Gardiner Sleeper, M.D., Ph.D. 01/29/21 4:57 PM   Abbreviations: M myopia (nearsighted); A astigmatism; H hyperopia (farsighted); P presbyopia; Mrx spectacle prescription;  CTL contact lenses; OD right eye; OS left eye; OU both eyes  XT exotropia; ET esotropia; PEK punctate epithelial keratitis; PEE punctate epithelial erosions; DES dry eye syndrome; MGD meibomian gland dysfunction; ATs artificial tears; PFAT's preservative free artificial tears; Pershing nuclear sclerotic cataract; PSC posterior subcapsular cataract; ERM epi-retinal membrane; PVD posterior vitreous detachment; RD retinal detachment; DM diabetes mellitus; DR diabetic retinopathy; NPDR non-proliferative diabetic retinopathy; PDR proliferative diabetic retinopathy; CSME clinically significant macular edema; DME diabetic macular edema; dbh dot blot hemorrhages; CWS cotton wool spot; POAG primary open angle glaucoma; C/D cup-to-disc ratio; HVF humphrey visual field; GVF goldmann visual field; OCT optical coherence tomography; IOP intraocular  pressure; BRVO Branch retinal vein occlusion; CRVO central retinal vein occlusion; CRAO central retinal artery occlusion; BRAO branch retinal artery occlusion; RT retinal tear; SB scleral buckle; PPV pars plana vitrectomy; VH Vitreous hemorrhage; PRP panretinal laser photocoagulation; IVK intravitreal kenalog; VMT vitreomacular traction; MH Macular hole;  NVD neovascularization of the disc; NVE neovascularization elsewhere; AREDS age related eye disease study; ARMD age related macular degeneration; POAG primary open angle glaucoma; EBMD epithelial/anterior basement membrane dystrophy; ACIOL anterior chamber intraocular lens; IOL intraocular lens; PCIOL posterior chamber intraocular lens; Phaco/IOL phacoemulsification with intraocular lens placement; Scott photorefractive keratectomy; LASIK laser assisted in situ keratomileusis; HTN hypertension; DM diabetes mellitus; COPD chronic obstructive pulmonary disease

## 2021-01-28 ENCOUNTER — Ambulatory Visit (INDEPENDENT_AMBULATORY_CARE_PROVIDER_SITE_OTHER): Payer: Medicare PPO | Admitting: Ophthalmology

## 2021-01-28 ENCOUNTER — Other Ambulatory Visit: Payer: Self-pay

## 2021-01-28 DIAGNOSIS — H35033 Hypertensive retinopathy, bilateral: Secondary | ICD-10-CM | POA: Diagnosis not present

## 2021-01-28 DIAGNOSIS — H3322 Serous retinal detachment, left eye: Secondary | ICD-10-CM | POA: Diagnosis not present

## 2021-01-28 DIAGNOSIS — I10 Essential (primary) hypertension: Secondary | ICD-10-CM

## 2021-01-28 DIAGNOSIS — H25813 Combined forms of age-related cataract, bilateral: Secondary | ICD-10-CM

## 2021-01-28 DIAGNOSIS — H3581 Retinal edema: Secondary | ICD-10-CM | POA: Diagnosis not present

## 2021-01-28 DIAGNOSIS — H33102 Unspecified retinoschisis, left eye: Secondary | ICD-10-CM

## 2021-01-29 ENCOUNTER — Encounter (INDEPENDENT_AMBULATORY_CARE_PROVIDER_SITE_OTHER): Payer: Self-pay | Admitting: Ophthalmology

## 2021-02-07 ENCOUNTER — Ambulatory Visit: Payer: Medicare PPO | Admitting: Cardiology

## 2021-02-07 ENCOUNTER — Encounter: Payer: Self-pay | Admitting: Cardiology

## 2021-02-07 ENCOUNTER — Other Ambulatory Visit: Payer: Self-pay

## 2021-02-07 VITALS — BP 138/84 | HR 72 | Ht 63.0 in | Wt 154.0 lb

## 2021-02-07 DIAGNOSIS — I471 Supraventricular tachycardia: Secondary | ICD-10-CM | POA: Diagnosis not present

## 2021-02-07 DIAGNOSIS — I201 Angina pectoris with documented spasm: Secondary | ICD-10-CM | POA: Diagnosis not present

## 2021-02-07 DIAGNOSIS — I10 Essential (primary) hypertension: Secondary | ICD-10-CM | POA: Diagnosis not present

## 2021-02-07 DIAGNOSIS — E782 Mixed hyperlipidemia: Secondary | ICD-10-CM | POA: Diagnosis not present

## 2021-02-07 NOTE — Patient Instructions (Signed)

## 2021-02-07 NOTE — Progress Notes (Signed)
Cardiology Office Note:    Date:  02/07/2021   ID:  Melissa Lozano, DOB 05/27/1941, MRN [id number]  PCP:  Nicoletta Dress, MD  Cardiologist:  Shirlee More, MD    Referring MD: Nicoletta Dress, MD    ASSESSMENT:    1. Coronary artery spasm ([place])   2. Essential hypertension   3. Mixed hyperlipidemia   4. PAT (paroxysmal atrial tachycardia) (HCC)    PLAN:    In order of problems listed above:  1. From a cardiology perspective doing well she has a history remote myocardial infarction, coronary artery spasm calcium score of 0 and has had no further cardiovascular symptoms on appropriate medical therapy including aspirin statin rate limiting calcium channel blocker 2. BP at target continue current treatment including clonidine that she can take as needed for paroxysmal severe hypertension verapamil and her diuretic 3. Continue her statin lipids at target especially with aortic atherosclerosis 4. Stable no recurrence continue rate limiting calcium channel blocker   Next appointment: 1 year   Medication Adjustments/Labs and Tests Ordered: Current medicines are reviewed at length with the patient today.  Concerns regarding medicines are outlined above.  No orders of the defined types were placed in this encounter.  No orders of the defined types were placed in this encounter.   Chief Complaint  Patient presents with  . Follow-up    Coronary artery spasm    History of Present Illness:    Melissa Lozano is a 80 y.o. female with a hx of coronary artery spasm hypertension hyperlipidemia paroxysmal atrial tachycardia last seen 07/04/2020.  Compliance with diet, lifestyle and medications: Yes  She is concerned because she had atherosclerosis on the CT scan I told her its not an unusual finding at age 24 and that she was treated with a statin and did not need any further evaluation at this time She is worried about living alone she had a retinal detachment was dependent on  people and is considering moving to a senior community. Her cardiology perspective as well no chest pain shortness of breath palpitation or syncope.  Past Medical History:  Diagnosis Date  . Angina pectoris ([place]) 08/16/2015   Overview:  stable, no recurrence of her present meds and continue same with a CCB, aspirin and a statin. Stress echo negative for ischemia at 10 mets 08/23/13  . C. difficile colitis 06/10/2017  . Cataract    Mixed OU  . Coronary artery spasm ([place]) 08/16/2015  . Essential hypertension 08/16/2015  . Hyperlipidemia 08/16/2015  . Hypertension   . Hypertensive retinopathy    OU  . Migraine 06/10/2017  . 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 Osteoporosis   . PAT (paroxysmal atrial tachycardia) ([place]) 08/16/2015  . Retinal detachment    OS    Past Surgical History:  Procedure Laterality Date  . ABDOMINAL HYSTERECTOMY    . BUNIONECTOMY     reverse bunionectomy  . CARDIAC CATHETERIZATION    . CHOLECYSTECTOMY    . EYE SURGERY Left 12/24/2020   Pneumatic retinopexy for retinoschisis RD repair - Dr. Bernarda Caffey  . HAMMER TOE SURGERY    . 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 Bernarda Caffey    Current Medications: Current Meds  Medication Sig  . aspirin EC 81 MG tablet Take 81 mg by mouth daily.  Marland Kitchen atorvastatin (LIPITOR) 10 MG  tablet Take 10 mg by mouth 3 (three) times a week.  . calcium-vitamin D (OSCAL WITH D) 500-200 MG-UNIT TABS tablet Take 1 tablet by mouth 2 (two) times daily.  . cloNIDine (CATAPRES) 0.1 MG tablet Take 0.1 mg by mouth as needed (anxiety).  Marland Kitchen denosumab (PROLIA) 60 MG/ML SOSY injection Inject 60 mg into the skin every 6 (six) months.  . diclofenac Sodium (VOLTAREN) 1 % GEL Apply 2 g topically as needed (pain).  . Multiple Vitamins-Minerals (MULTIPLE VITAMINS/WOMENS PO) Take 1 tablet by mouth daily.  .  nitroGLYCERIN (NITROSTAT) 0.4 MG SL tablet Place 1 tablet (0.4 mg total) under the tongue every 5 (five) minutes as needed for chest pain.  . Omega-3 Fatty Acids (FISH OIL) 1000 MG CAPS Take 1 capsule by mouth daily.  . polyethylene glycol powder (GLYCOLAX/MIRALAX) powder MIX 17GMS (1 CAPFUL) IN 8OZ OF LIQUID AND DRINK ONCE DAILY  . psyllium (METAMUCIL) 58.6 % packet Take 1 packet by mouth daily.  Marland Kitchen spironolactone-hydrochlorothiazide (ALDACTAZIDE) 25-25 MG per tablet Take 1 tablet by mouth 3 (three) times a week.   . verapamil (CALAN-SR) 180 MG CR tablet TAKE ONE TABLET BY MOUTH TWICE DAILY  . Vitamins-Lipotropics (LIPOFLAVOVIT PO) Take 1 tablet by mouth 2 (two) times daily.   Current Facility-Administered Medications for the 02/07/21 encounter (Office Visit) with Richardo Priest, MD  Medication  . dexamethasone (DECADRON) injection 4 mg     Allergies:   Sulfa antibiotics, Codeine, Tape, and Wound dressing adhesive   Social History   Socioeconomic History  . Marital status: Widowed    Spouse name: Not on file  . Number of children: Not on file  . Years of education: Not on file  . Highest education level: Not on file  Occupational History  . Not on file  Tobacco Use  . Smoking status: Never Smoker  . Smokeless tobacco: Never Used  Vaping Use  . Vaping Use: Never used  Substance and Sexual Activity  . Alcohol use: Yes    Alcohol/week: 0.0 standard drinks    Comment: occasional  . Drug use: No  . Sexual activity: Not on file  Other Topics Concern  . Not on file  Social History Narrative  . Not on file   Social Determinants of Health   Financial Resource Strain: Not on file  Food Insecurity: Not on file  Transportation Needs: Not on file  Physical Activity: Not on file  Stress: Not on file  Social Connections: Not on file     Family History: The patient's family history includes Aneurysm in her father; Diabetes in her maternal grandmother. ROS:   Please see the  history of present illness.    All other systems reviewed and are negative.  EKGs/Labs/Other Studies Reviewed:    The following studies were reviewed today:   Recent Labs: 10/14/2020: Cholesterol 183 LDL 93 triglycerides 88 HDL 75 creatinine 0.78 A1c 5.6% Recent Lipid Panel    Component Value Date/Time   CHOL 176 01/08/2020 1350   TRIG 71 01/08/2020 1350   HDL 81 01/08/2020 1350   CHOLHDL 2.2 01/08/2020 1350   LDLCALC 82 01/08/2020 1350    Physical Exam:    VS:  BP 138/84 (BP Location: Right Arm, Patient Position: Sitting, Cuff Size: Normal)   Pulse 72   Ht 5\' 3"  (1.6 m)   Wt 154 lb (69.9 kg)   SpO2 96%   BMI 27.28 kg/m     Wt Readings from Last 3 Encounters:  02/07/21 154 lb (  69.9 kg)  07/04/20 155 lb (70.3 kg)  01/08/20 153 lb 6.4 oz (69.6 kg)     GEN:  Well nourished, well developed in no acute distress HEENT: Normal NECK: No JVD; No carotid bruits LYMPHATICS: No lymphadenopathy CARDIAC: RRR, no murmurs, rubs, gallops RESPIRATORY:  Clear to auscultation without rales, wheezing or rhonchi  ABDOMEN: Soft, non-tender, non-distended MUSCULOSKELETAL:  No edema; No deformity  SKIN: Warm and dry NEUROLOGIC:  Alert and oriented x 3 PSYCHIATRIC:  Normal affect    Signed, Shirlee More, MD  02/07/2021 4:07 PM     [facility]

## 2021-02-18 NOTE — Progress Notes (Signed)
[facility] Note  02/19/2021     CHIEF COMPLAINT Patient presents for Retina Follow Up   HISTORY OF PRESENT ILLNESS: Melissa Lozano is a 80 y.o. female who presents to the clinic today for:   HPI    Retina Follow Up    Patient presents with  Retinal Break/Detachment.  In left eye.  This started weeks ago.  Severity is moderate.  Duration of weeks.  Since onset it is stable.  I, the attending physician,  performed the HPI with the patient and updated documentation appropriately.          Comments    Pt states vision is the same OS, but has noticed a gradual decrease in vision OD--patient wants to know if its time to have cataract surgery but also has concerns about [place].  Pt denies eye pain.  Pt complains of floater OD; Denies FOL.       Last edited by Bernarda Caffey, MD on 02/21/2021 10:32 PM. (History)    Pt feels she is doing well; VA OD fuzzy at times.  Referring physician: Luberta Mutter, MD [place],   [id number]  HISTORICAL INFORMATION:   Selected notes from the MEDICAL RECORD NUMBER Referred by Dr. Luberta Mutter for RD OS   CURRENT MEDICATIONS: No current outpatient medications on file. (Ophthalmic Drugs)   No current facility-administered medications for this visit. (Ophthalmic Drugs)   Current Outpatient Medications (Other)  Medication Sig  . aspirin EC 81 MG tablet Take 81 mg by mouth daily.  Marland Kitchen atorvastatin (LIPITOR) 10 MG tablet Take 10 mg by mouth 3 (three) times a week.  . calcium-vitamin D (OSCAL WITH D) 500-200 MG-UNIT TABS tablet Take 1 tablet by mouth 2 (two) times daily.  . cloNIDine (CATAPRES) 0.1 MG tablet Take 0.1 mg by mouth as needed (anxiety).  Marland Kitchen denosumab (PROLIA) 60 MG/ML SOSY injection Inject 60 mg into the skin every 6 (six) months.  . diclofenac Sodium (VOLTAREN) 1 % GEL Apply 2 g topically as needed (pain).  . Multiple Vitamins-Minerals (MULTIPLE VITAMINS/WOMENS PO) Take 1 tablet by mouth  daily.  . nitroGLYCERIN (NITROSTAT) 0.4 MG SL tablet Place 1 tablet (0.4 mg total) under the tongue every 5 (five) minutes as needed for chest pain.  . Omega-3 Fatty Acids (FISH OIL) 1000 MG CAPS Take 1 capsule by mouth daily.  . polyethylene glycol powder (GLYCOLAX/MIRALAX) powder MIX 17GMS (1 CAPFUL) IN 8OZ OF LIQUID AND DRINK ONCE DAILY  . psyllium (METAMUCIL) 58.6 % packet Take 1 packet by mouth daily.  Marland Kitchen spironolactone-hydrochlorothiazide (ALDACTAZIDE) 25-25 MG per tablet Take 1 tablet by mouth 3 (three) times a week.   . verapamil (CALAN-SR) 180 MG CR tablet TAKE ONE TABLET BY MOUTH TWICE DAILY  . Vitamins-Lipotropics (LIPOFLAVOVIT PO) Take 1 tablet by mouth 2 (two) times daily.   Current Facility-Administered Medications (Other)  Medication Route  . dexamethasone (DECADRON) injection 4 mg Intra-articular      REVIEW OF SYSTEMS: ROS    Positive for: Musculoskeletal, Cardiovascular, Eyes   Negative for: Constitutional, Gastrointestinal, Neurological, Skin, Genitourinary, HENT, Endocrine, Respiratory, Psychiatric, Allergic/Imm, Heme/Lymph   Last edited by Doneen Poisson on 02/19/2021  9:29 AM. (History)       ALLERGIES Allergies  Allergen Reactions  . Sulfa Antibiotics Nausea Only and Nausea And Vomiting    Other reaction(s): Vomiting Other reaction(s): Vomiting   . Codeine Itching  . Tape Itching  . Wound Dressing Adhesive Itching  PAST MEDICAL HISTORY Past Medical History:  Diagnosis Date  . Angina pectoris ([place]) 08/16/2015   Overview:  stable, no recurrence of her present meds and continue same with a CCB, aspirin and a statin. Stress echo negative for ischemia at 10 mets 08/23/13  . C. difficile colitis 06/10/2017  . Cataract    Mixed OU  . Coronary artery spasm ([place]) 08/16/2015  . Essential hypertension 08/16/2015  . Hyperlipidemia 08/16/2015  . Hypertension   . Hypertensive retinopathy    OU  . Migraine 06/10/2017  . Old MI (myocardial infarction)  06/10/2017   STEMI September 2008, normal coronary arteriography, 0 CTCa Score    . Osteoporosis   . PAT (paroxysmal atrial tachycardia) ([place]) 08/16/2015  . Retinal detachment    OS   Past Surgical History:  Procedure Laterality Date  . ABDOMINAL HYSTERECTOMY    . BUNIONECTOMY     reverse bunionectomy  . CARDIAC CATHETERIZATION    . CHOLECYSTECTOMY    . EYE SURGERY Left 12/24/2020   Pneumatic retinopexy for retinoschisis RD repair - Dr. Bernarda Caffey  . HAMMER TOE SURGERY    . 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 Bernarda Caffey    FAMILY HISTORY Family History  Problem Relation Age of Onset  . Aneurysm Father   . Diabetes Maternal Grandmother     SOCIAL HISTORY Social History   Tobacco Use  . Smoking status: Never Smoker  . Smokeless tobacco: Never Used  Vaping Use  . Vaping Use: Never used  Substance Use Topics  . Alcohol use: Yes    Alcohol/week: 0.0 standard drinks    Comment: occasional  . Drug use: No         OPHTHALMIC EXAM:  Base Eye Exam    Visual Acuity (Snellen - Linear)      Right Left   Dist cc 20/50 +1 20/40 -1   Dist ph cc 20/25 -2 20/25 -2   Correction: Glasses       Tonometry (Tonopen, 9:28 AM)      Right Left   Pressure 12 14       Pupils      Dark Light Shape React APD   Right 3 2 Round Brisk 0   Left 3 2 Round Brisk 0       Visual Fields      Left Right    Full Full       Extraocular Movement      Right Left    Full Full       Neuro/Psych    Oriented x3: Yes   Mood/Affect: Normal       Dilation    Both eyes: 1.0% Mydriacyl, 2.5% Phenylephrine @ 9:28 AM        Slit Lamp and Fundus Exam    Slit Lamp Exam      Right Left   Lids/Lashes Dermatochalasis - upper lid Dermatochalasis - upper lid, periorbital errythema nasal   Conjunctiva/Sclera White and quiet Subconjunctival hemorrhage ST quad improving   Cornea arcus, 2-3+ Guttata arcus, trace PEE, 2+  guttata   Anterior Chamber Deep and quiet Deep and quiet   Iris Round and dilated Round and dilated   Lens 3+ Nuclear sclerosis w/brunescence, 3+ Cortical cataract 3+ Nuclear sclerosis w/brunescence, 3+ Cortical cataract   Vitreous Vitreous syneresis, no pigment, Posterior vitreous detachment Vitreous syneresis, trace pigment, Posterior vitreous detachment, vitreous condensations and debris settling inferiorly, gas bubble gone  Fundus Exam      Right Left   Disc Pink and Sharp, Compact Pink and Sharp, Compact   C/D Ratio 0.1 0.1   Macula Flat, Blunted foveal reflex, mild Drusen, RPE mottling and clumping, No heme or edema Flat, Good foveal reflex, RPE mottling and clumping, mild Drusen, trace ERM   Vessels mild attenuation, mild tortuousity attenuated, mild tortuousity   Periphery Attached; no RT/RD or heme Attached, ST SRF stably resolved, good cryo changes in place, no new RT/RD, partially pigmented demarcation line at posterior border of detachment; ORIGINALLY: schisis detachment from 0130-0400 with retinal hole at 0200; pigment deposition along posterior border of detachment - likely chronic        Refraction    Wearing Rx      Sphere Cylinder Axis Add Vert Prism   Right +0.50 +0.75 005 +2.75 2 BD   Left +0.25 +0.75 155 +2.75 2 BU          IMAGING AND PROCEDURES  Imaging and Procedures for 02/19/2021  OCT, Retina - OU - Both Eyes       Right Eye Quality was good. Central Foveal Thickness: 305. Progression has been stable. Findings include normal foveal contour, no IRF, no SRF, retinal drusen .   Left Eye Quality was good. Central Foveal Thickness: 316. Progression has been stable. Findings include normal foveal contour, epiretinal membrane, no SRF, no IRF (Stable improvement in SRF ST periphery, mild improvement in vitreous opacities).   Notes *Images captured and stored on drive  Diagnosis / Impression:  OD: NFP, no IRF/SRF OS: NFP, no IRF/SRF; Stable improvement  in SRF ST periphery, mild improvement in vitreous opacities  Clinical management:  See below  Abbreviations: NFP - Normal foveal profile. CME - cystoid macular edema. PED - pigment epithelial detachment. IRF - intraretinal fluid. SRF - subretinal fluid. EZ - ellipsoid zone. ERM - epiretinal membrane. ORA - outer retinal atrophy. ORT - outer retinal tubulation. SRHM - subretinal hyper-reflective material. IRHM - intraretinal hyper-reflective material               ASSESSMENT/PLAN:    ICD-10-CM   1. 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 Retinal edema  H35.81 OCT, Retina - OU - Both Eyes  4. Essential hypertension  I10   5. Hypertensive retinopathy of both eyes  H35.033   6. Combined forms of age-related cataract of both eyes  H25.813    1-3. Retinoschisis retinal detachment, OS - focal, peripheral schisis detachment from 0130-0330 - retinal hole at 0200 and +pigment along posterior border -- likely chronic - asymptomatic - s/p pneumatic retinopexy OS (04.05.22) - retina reattached w/ good cryo changes in place, ST quad - OCT shows stable improvement in SRF ST periphery, mild improvement in vitreous opacities - gas bubble now gone - BCVA 20/25-2 - f/u 1 yr, sooner prn  4,5. Hypertensive retinopathy OU - discussed importance of tight BP control - monitor  6. Mixed Cataract OU - The symptoms of cataract, surgical options, and treatments and risks were discussed with patient. - discussed diagnosis and progression - under the expert management of Dr. Ellie Lunch - now clear from a retina standpoint to proceed with cataract surgery when both patient and surgeon are ready.  Appt. w/Dr. Ellie Lunch on 6.10.22.  Ophthalmic Meds Ordered this visit:  No orders of the defined types were placed in this encounter.     Return in about 1 year (around 02/19/2022) for 1 yr  f/u for RD OS w/DFE&OCT.  There are no Patient Instructions on file for this visit.   This  document serves as a record of services personally performed by Gardiner Sleeper, MD, PhD. It was created on their behalf by Roselee Nova, COMT. The creation of this record is the provider's dictation and/or activities during the visit.  Electronically signed by: Roselee Nova, COMT 02/21/21 10:41 PM  This document serves as a record of services personally performed by Gardiner Sleeper, MD, PhD. It was created on their behalf by Estill Bakes, COT an ophthalmic technician. The creation of this record is the provider's dictation and/or activities during the visit.    Electronically signed by: Estill Bakes, COT 6.1.22 @ 10:41 PM  Gardiner Sleeper, M.D., Ph.D. Diseases & Surgery of the Retina and Vitreous [facility] 6.1.22  I have reviewed the above documentation for accuracy and completeness, and I agree with the above. Gardiner Sleeper, M.D., Ph.D. 02/21/21 10:41 PM   Abbreviations: M myopia (nearsighted); A astigmatism; H hyperopia (farsighted); P presbyopia; Mrx spectacle prescription;  CTL contact lenses; OD right eye; OS left eye; OU both eyes  XT exotropia; ET esotropia; PEK punctate epithelial keratitis; PEE punctate epithelial erosions; DES dry eye syndrome; MGD meibomian gland dysfunction; ATs artificial tears; PFAT's preservative free artificial tears; [place] nuclear sclerotic cataract; PSC posterior subcapsular cataract; ERM epi-retinal membrane; PVD posterior vitreous detachment; RD retinal detachment; DM diabetes mellitus; DR diabetic retinopathy; NPDR non-proliferative diabetic retinopathy; PDR proliferative diabetic retinopathy; CSME clinically significant macular edema; DME diabetic macular edema; dbh dot blot hemorrhages; CWS cotton wool spot; POAG primary open angle glaucoma; C/D cup-to-disc ratio; HVF humphrey visual field; GVF goldmann visual field; OCT optical coherence tomography; IOP intraocular pressure; BRVO Branch retinal vein occlusion; CRVO central retinal  vein occlusion; CRAO central retinal artery occlusion; BRAO branch retinal artery occlusion; RT retinal tear; SB scleral buckle; PPV pars plana vitrectomy; VH Vitreous hemorrhage; PRP panretinal laser photocoagulation; IVK intravitreal kenalog; VMT vitreomacular traction; MH Macular hole;  NVD neovascularization of the disc; NVE neovascularization elsewhere; AREDS age related eye disease study; ARMD age related macular degeneration; POAG primary open angle glaucoma; EBMD epithelial/anterior basement membrane dystrophy; ACIOL anterior chamber intraocular lens; IOL intraocular lens; PCIOL posterior chamber intraocular lens; Phaco/IOL phacoemulsification with intraocular lens placement; [place] photorefractive keratectomy; LASIK laser assisted in situ keratomileusis; HTN hypertension; DM diabetes mellitus; COPD chronic obstructive pulmonary disease

## 2021-02-19 ENCOUNTER — Other Ambulatory Visit: Payer: Self-pay

## 2021-02-19 ENCOUNTER — Ambulatory Visit (INDEPENDENT_AMBULATORY_CARE_PROVIDER_SITE_OTHER): Payer: Medicare PPO | Admitting: Ophthalmology

## 2021-02-19 DIAGNOSIS — H3322 Serous retinal detachment, left eye: Secondary | ICD-10-CM

## 2021-02-19 DIAGNOSIS — H33102 Unspecified retinoschisis, left eye: Secondary | ICD-10-CM | POA: Diagnosis not present

## 2021-02-19 DIAGNOSIS — H35033 Hypertensive retinopathy, bilateral: Secondary | ICD-10-CM

## 2021-02-19 DIAGNOSIS — H25813 Combined forms of age-related cataract, bilateral: Secondary | ICD-10-CM

## 2021-02-19 DIAGNOSIS — H3581 Retinal edema: Secondary | ICD-10-CM

## 2021-02-19 DIAGNOSIS — I10 Essential (primary) hypertension: Secondary | ICD-10-CM

## 2021-02-21 ENCOUNTER — Encounter (INDEPENDENT_AMBULATORY_CARE_PROVIDER_SITE_OTHER): Payer: Self-pay | Admitting: Ophthalmology

## 2021-02-21 NOTE — Addendum Note (Signed)
Addended by: Gardiner Sleeper on: 02/21/2021 10:56 PM   Modules accepted: Level of Service

## 2021-02-28 DIAGNOSIS — H18513 Endothelial corneal dystrophy, bilateral: Secondary | ICD-10-CM | POA: Diagnosis not present

## 2021-02-28 DIAGNOSIS — H2513 Age-related nuclear cataract, bilateral: Secondary | ICD-10-CM | POA: Diagnosis not present

## 2021-03-13 DIAGNOSIS — H2511 Age-related nuclear cataract, right eye: Secondary | ICD-10-CM | POA: Diagnosis not present

## 2021-03-13 DIAGNOSIS — H25811 Combined forms of age-related cataract, right eye: Secondary | ICD-10-CM | POA: Diagnosis not present

## 2021-04-01 DIAGNOSIS — E785 Hyperlipidemia, unspecified: Secondary | ICD-10-CM | POA: Diagnosis not present

## 2021-04-01 DIAGNOSIS — I10 Essential (primary) hypertension: Secondary | ICD-10-CM | POA: Diagnosis not present

## 2021-04-01 DIAGNOSIS — I471 Supraventricular tachycardia: Secondary | ICD-10-CM | POA: Diagnosis not present

## 2021-04-01 DIAGNOSIS — M810 Age-related osteoporosis without current pathological fracture: Secondary | ICD-10-CM | POA: Diagnosis not present

## 2021-04-08 DIAGNOSIS — H903 Sensorineural hearing loss, bilateral: Secondary | ICD-10-CM | POA: Diagnosis not present

## 2021-05-06 ENCOUNTER — Other Ambulatory Visit: Payer: Self-pay | Admitting: Cardiology

## 2021-05-14 DIAGNOSIS — R310 Gross hematuria: Secondary | ICD-10-CM | POA: Diagnosis not present

## 2021-05-14 DIAGNOSIS — N3281 Overactive bladder: Secondary | ICD-10-CM | POA: Diagnosis not present

## 2021-05-14 DIAGNOSIS — N952 Postmenopausal atrophic vaginitis: Secondary | ICD-10-CM | POA: Diagnosis not present

## 2021-05-20 DIAGNOSIS — M1712 Unilateral primary osteoarthritis, left knee: Secondary | ICD-10-CM | POA: Diagnosis not present

## 2021-05-20 DIAGNOSIS — M25562 Pain in left knee: Secondary | ICD-10-CM | POA: Diagnosis not present

## 2021-05-21 DIAGNOSIS — R232 Flushing: Secondary | ICD-10-CM | POA: Diagnosis not present

## 2021-05-29 DIAGNOSIS — M810 Age-related osteoporosis without current pathological fracture: Secondary | ICD-10-CM | POA: Diagnosis not present

## 2021-06-10 DIAGNOSIS — L728 Other follicular cysts of the skin and subcutaneous tissue: Secondary | ICD-10-CM | POA: Diagnosis not present

## 2021-06-10 DIAGNOSIS — L570 Actinic keratosis: Secondary | ICD-10-CM | POA: Diagnosis not present

## 2021-06-10 DIAGNOSIS — L821 Other seborrheic keratosis: Secondary | ICD-10-CM | POA: Diagnosis not present

## 2021-06-12 DIAGNOSIS — H25812 Combined forms of age-related cataract, left eye: Secondary | ICD-10-CM | POA: Diagnosis not present

## 2021-06-12 DIAGNOSIS — H2512 Age-related nuclear cataract, left eye: Secondary | ICD-10-CM | POA: Diagnosis not present

## 2021-07-11 DIAGNOSIS — H353131 Nonexudative age-related macular degeneration, bilateral, early dry stage: Secondary | ICD-10-CM | POA: Diagnosis not present

## 2021-07-25 DIAGNOSIS — L728 Other follicular cysts of the skin and subcutaneous tissue: Secondary | ICD-10-CM | POA: Diagnosis not present

## 2021-07-25 DIAGNOSIS — L578 Other skin changes due to chronic exposure to nonionizing radiation: Secondary | ICD-10-CM | POA: Diagnosis not present

## 2021-07-25 DIAGNOSIS — L820 Inflamed seborrheic keratosis: Secondary | ICD-10-CM | POA: Diagnosis not present

## 2021-09-04 DIAGNOSIS — D485 Neoplasm of uncertain behavior of skin: Secondary | ICD-10-CM | POA: Diagnosis not present

## 2021-09-09 DIAGNOSIS — B958 Unspecified staphylococcus as the cause of diseases classified elsewhere: Secondary | ICD-10-CM | POA: Diagnosis not present

## 2021-09-09 DIAGNOSIS — L089 Local infection of the skin and subcutaneous tissue, unspecified: Secondary | ICD-10-CM | POA: Diagnosis not present

## 2021-10-07 DIAGNOSIS — M810 Age-related osteoporosis without current pathological fracture: Secondary | ICD-10-CM | POA: Diagnosis not present

## 2021-10-07 DIAGNOSIS — I10 Essential (primary) hypertension: Secondary | ICD-10-CM | POA: Diagnosis not present

## 2021-10-07 DIAGNOSIS — I471 Supraventricular tachycardia: Secondary | ICD-10-CM | POA: Diagnosis not present

## 2021-10-07 DIAGNOSIS — E785 Hyperlipidemia, unspecified: Secondary | ICD-10-CM | POA: Diagnosis not present

## 2021-10-08 DIAGNOSIS — E785 Hyperlipidemia, unspecified: Secondary | ICD-10-CM | POA: Diagnosis not present

## 2021-10-21 ENCOUNTER — Other Ambulatory Visit: Payer: Self-pay | Admitting: Cardiology

## 2021-11-13 DIAGNOSIS — H353131 Nonexudative age-related macular degeneration, bilateral, early dry stage: Secondary | ICD-10-CM | POA: Diagnosis not present

## 2021-11-13 DIAGNOSIS — H2000 Unspecified acute and subacute iridocyclitis: Secondary | ICD-10-CM | POA: Diagnosis not present

## 2021-11-20 DIAGNOSIS — H353131 Nonexudative age-related macular degeneration, bilateral, early dry stage: Secondary | ICD-10-CM | POA: Diagnosis not present

## 2021-11-20 DIAGNOSIS — H18513 Endothelial corneal dystrophy, bilateral: Secondary | ICD-10-CM | POA: Diagnosis not present

## 2021-11-28 ENCOUNTER — Other Ambulatory Visit: Payer: Self-pay | Admitting: Cardiology

## 2021-12-02 DIAGNOSIS — Z1231 Encounter for screening mammogram for malignant neoplasm of breast: Secondary | ICD-10-CM | POA: Diagnosis not present

## 2021-12-19 DIAGNOSIS — M810 Age-related osteoporosis without current pathological fracture: Secondary | ICD-10-CM | POA: Diagnosis not present

## 2022-01-12 ENCOUNTER — Other Ambulatory Visit: Payer: Self-pay | Admitting: Cardiology

## 2022-01-13 DIAGNOSIS — H35373 Puckering of macula, bilateral: Secondary | ICD-10-CM | POA: Diagnosis not present

## 2022-01-13 DIAGNOSIS — H31002 Unspecified chorioretinal scars, left eye: Secondary | ICD-10-CM | POA: Diagnosis not present

## 2022-01-13 DIAGNOSIS — H18513 Endothelial corneal dystrophy, bilateral: Secondary | ICD-10-CM | POA: Diagnosis not present

## 2022-01-13 DIAGNOSIS — H532 Diplopia: Secondary | ICD-10-CM | POA: Diagnosis not present

## 2022-02-09 DIAGNOSIS — D485 Neoplasm of uncertain behavior of skin: Secondary | ICD-10-CM | POA: Diagnosis not present

## 2022-02-09 DIAGNOSIS — D692 Other nonthrombocytopenic purpura: Secondary | ICD-10-CM | POA: Diagnosis not present

## 2022-02-09 DIAGNOSIS — C441192 Basal cell carcinoma of skin of left lower eyelid, including canthus: Secondary | ICD-10-CM | POA: Diagnosis not present

## 2022-02-09 DIAGNOSIS — Z129 Encounter for screening for malignant neoplasm, site unspecified: Secondary | ICD-10-CM | POA: Diagnosis not present

## 2022-02-09 DIAGNOSIS — L570 Actinic keratosis: Secondary | ICD-10-CM | POA: Diagnosis not present

## 2022-02-09 DIAGNOSIS — L821 Other seborrheic keratosis: Secondary | ICD-10-CM | POA: Diagnosis not present

## 2022-02-09 DIAGNOSIS — Z85828 Personal history of other malignant neoplasm of skin: Secondary | ICD-10-CM | POA: Diagnosis not present

## 2022-02-09 DIAGNOSIS — D1801 Hemangioma of skin and subcutaneous tissue: Secondary | ICD-10-CM | POA: Diagnosis not present

## 2022-02-09 DIAGNOSIS — L720 Epidermal cyst: Secondary | ICD-10-CM | POA: Diagnosis not present

## 2022-02-13 ENCOUNTER — Other Ambulatory Visit: Payer: Self-pay | Admitting: Cardiology

## 2022-03-03 DIAGNOSIS — R32 Unspecified urinary incontinence: Secondary | ICD-10-CM | POA: Diagnosis not present

## 2022-03-03 DIAGNOSIS — I10 Essential (primary) hypertension: Secondary | ICD-10-CM | POA: Diagnosis not present

## 2022-03-03 DIAGNOSIS — E538 Deficiency of other specified B group vitamins: Secondary | ICD-10-CM | POA: Diagnosis not present

## 2022-03-03 DIAGNOSIS — R350 Frequency of micturition: Secondary | ICD-10-CM | POA: Diagnosis not present

## 2022-03-03 DIAGNOSIS — E785 Hyperlipidemia, unspecified: Secondary | ICD-10-CM | POA: Diagnosis not present

## 2022-03-03 DIAGNOSIS — R5382 Chronic fatigue, unspecified: Secondary | ICD-10-CM | POA: Diagnosis not present

## 2022-03-03 DIAGNOSIS — I471 Supraventricular tachycardia: Secondary | ICD-10-CM | POA: Diagnosis not present

## 2022-03-03 DIAGNOSIS — M810 Age-related osteoporosis without current pathological fracture: Secondary | ICD-10-CM | POA: Diagnosis not present

## 2022-03-04 DIAGNOSIS — H269 Unspecified cataract: Secondary | ICD-10-CM | POA: Insufficient documentation

## 2022-03-04 DIAGNOSIS — H3320 Serous retinal detachment, unspecified eye: Secondary | ICD-10-CM | POA: Insufficient documentation

## 2022-03-04 DIAGNOSIS — H35039 Hypertensive retinopathy, unspecified eye: Secondary | ICD-10-CM | POA: Insufficient documentation

## 2022-03-09 ENCOUNTER — Encounter: Payer: Self-pay | Admitting: Cardiology

## 2022-03-09 ENCOUNTER — Ambulatory Visit: Payer: Medicare PPO | Admitting: Cardiology

## 2022-03-09 VITALS — BP 146/80 | HR 67 | Ht 63.0 in | Wt 161.0 lb

## 2022-03-09 DIAGNOSIS — E039 Hypothyroidism, unspecified: Secondary | ICD-10-CM

## 2022-03-09 DIAGNOSIS — E782 Mixed hyperlipidemia: Secondary | ICD-10-CM

## 2022-03-09 DIAGNOSIS — I201 Angina pectoris with documented spasm: Secondary | ICD-10-CM

## 2022-03-09 DIAGNOSIS — I10 Essential (primary) hypertension: Secondary | ICD-10-CM

## 2022-03-09 DIAGNOSIS — I471 Supraventricular tachycardia: Secondary | ICD-10-CM | POA: Diagnosis not present

## 2022-03-09 NOTE — Patient Instructions (Signed)
Medication Instructions:  Your physician has recommended you make the following change in your medication:   STOP: Atorvastatin  *If you need a refill on your cardiac medications before your next appointment, please call your pharmacy*   Lab Work: None If you have labs (blood work) drawn today and your tests are completely normal, you will receive your results only by: [place] (if you have MyChart) OR A paper copy in the mail If you have any lab test that is abnormal or we need to change your treatment, we will call you to review the results.   Testing/Procedures: None   Follow-Up: At [facility], you and your health needs are our priority.  As part of our continuing mission to provide you with exceptional heart care, we have created designated Provider Care Teams.  These Care Teams include your primary Cardiologist (physician) and Advanced Practice Providers (APPs -  Physician Assistants and Nurse Practitioners) who all work together to provide you with the care you need, when you need it.  We recommend signing up for the patient portal called "MyChart".  Sign up information is provided on this After Visit Summary.  MyChart is used to connect with patients for Virtual Visits (Telemedicine).  Patients are able to view lab/test results, encounter notes, upcoming appointments, etc.  Non-urgent messages can be sent to your provider as well.   To learn more about what you can do with MyChart, go to [email or web address].    Your next appointment:   6 month(s)  The format for your next appointment:   In Person  Provider:   Shirlee More, MD    Other Instructions None  Important Information About Sugar

## 2022-03-09 NOTE — Progress Notes (Signed)
Cardiology Office Note:    Date:  03/09/2022   ID:  Melissa Lozano, DOB 03/17/1941, MRN [id number]  PCP:  Nicoletta Dress, MD  Cardiologist:  Shirlee More, MD    Referring MD: Nicoletta Dress, MD    ASSESSMENT:    1. Acquired hypothyroidism   2. PAT (paroxysmal atrial tachycardia) ([place])   3. Coronary artery spasm ([place])   4. Essential hypertension   5. Mixed hyperlipidemia    PLAN:    In order of problems listed above:  She has had marked diffuse multisystem symptoms from her thyroid disease she has a great deal of muscle weakness and I think she should stop her high intensity statin we can reassess in the future when she is improved with thyroid supplement. No recurrent atrial tachyarrhythmias she will continue her calcium channel blocker No recurrent coronary vasospasm continue aspirin calcium channel blocker Well-controlled continue current treatment clonidine and diuretic proximal and distal combination Because of her muscle weakness and falls stop her statin we can reassess in future follow-up once her thyroid is stabilized   Next appointment: 6 months   Medication Adjustments/Labs and Tests Ordered: Current medicines are reviewed at length with the patient today.  Concerns regarding medicines are outlined above.  Orders Placed This Encounter  Procedures   EKG 12-Lead   No orders of the defined types were placed in this encounter.   Chief Complaint  Patient presents with   Follow-up   Coronary Artery Disease   PAT    History of Present Illness:    Melissa Lozano is a 81 y.o. female with a hx of coronary artery spasm hypertension hyperlipidemia paroxysmal atrial tachycardia last seen 02/07/2021.   Compliance with diet, lifestyle and medications: Yes  She has relocated to assisted living at [facility] top She is struggled recently recognized hypothyroidism affecting her in many ways particularly weakness changes in her facial appearance and symptoms of  carpal tunnel at night She is taking supplements for less than a week and swelling. She has had a great deal of muscle weakness struggles to get out of the chair she has had a fall she takes a high intensity statin I told her for the time being she should stop. No chest pain rapid heart rhythm edema shortness of breath. 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 Stress echo negative for ischemia at 10 mets 08/23/13   C. difficile colitis 06/10/2017   Cataract    Mixed OU   Coronary artery spasm ([place]) 08/16/2015   Essential hypertension 08/16/2015   Hyperlipidemia 08/16/2015   Hypertension    Hypertensive retinopathy    OU   Migraine 06/10/2017   Old MI (myocardial infarction) 06/10/2017   STEMI September 2008, normal coronary arteriography, 0 CTCa Score     Osteoporosis    PAT (paroxysmal atrial tachycardia) ([place]) 08/16/2015   Retinal detachment    OS    Past Surgical History:  Procedure Laterality Date   ABDOMINAL HYSTERECTOMY     BUNIONECTOMY     reverse bunionectomy   CARDIAC CATHETERIZATION     CHOLECYSTECTOMY     EYE SURGERY Left 12/24/2020   Pneumatic retinopexy for retinoschisis RD repair - Dr. Bernarda Caffey   HAMMER TOE SURGERY     RETINAL DETACHMENT SURGERY Left 12/24/2020  Pneumatic retinopexy for repair of retinoschisis RD - Dr. Bernarda Caffey    Current Medications: Current Meds  Medication Sig   aspirin EC 81 MG tablet Take 81 mg by mouth daily.   calcium-vitamin D (OSCAL WITH D) 500-200 MG-UNIT TABS tablet Take 1 tablet by mouth 2 (two) times daily.   cloNIDine (CATAPRES) 0.1 MG tablet Take 0.1 mg by mouth as needed (anxiety).   denosumab (PROLIA) 60 MG/ML SOSY injection Inject 60 mg into the skin every 6 (six) months.   diclofenac Sodium (VOLTAREN) 1 % GEL Apply 2 g topically as needed  (pain).   levothyroxine (SYNTHROID) 50 MCG tablet Take 50 mcg by mouth daily.   Multiple Vitamins-Minerals (MULTIPLE VITAMINS/WOMENS PO) Take 1 tablet by mouth daily.   nitroGLYCERIN (NITROSTAT) 0.4 MG SL tablet Place 1 tablet (0.4 mg total) under the tongue every 5 (five) minutes as needed for chest pain.   Omega-3 Fatty Acids (FISH OIL) 1000 MG CAPS Take 1 capsule by mouth daily.   polyethylene glycol powder (GLYCOLAX/MIRALAX) powder MIX 17GMS (1 CAPFUL) IN 8OZ OF LIQUID AND DRINK ONCE DAILY   psyllium (METAMUCIL) 58.6 % packet Take 1 packet by mouth daily.   spironolactone-hydrochlorothiazide (ALDACTAZIDE) 25-25 MG per tablet Take 1 tablet by mouth 3 (three) times a week.    verapamil (CALAN-SR) 180 MG CR tablet Take 1 tablet (180 mg total) by mouth 2 (two) times daily. Patient must keep appointment for 03/09/22 for further refills. 1 st attempt   Vitamins-Lipotropics (LIPOFLAVOVIT PO) Take 1 tablet by mouth 2 (two) times daily.   [DISCONTINUED] atorvastatin (LIPITOR) 10 MG tablet TAKE ONE TABLET 3 times weekly   Current Facility-Administered Medications for the 03/09/22 encounter (Office Visit) with Richardo Priest, MD  Medication   dexamethasone (DECADRON) injection 4 mg     Allergies:   Sulfa antibiotics, Codeine, Tape, and Wound dressing adhesive   Social History   Socioeconomic History   Marital status: Widowed    Spouse name: Not on file   Number of children: Not on file   Years of education: Not on file   Highest education level: Not on file  Occupational History   Not on file  Tobacco Use   Smoking status: Never   Smokeless tobacco: Never  Vaping Use   Vaping Use: Never used  Substance and Sexual Activity   Alcohol use: Yes    Alcohol/week: 0.0 standard drinks of alcohol    Comment: occasional   Drug use: No   Sexual activity: Not on file  Other Topics Concern   Not on file  Social History Narrative   Not on file   Social Determinants of Health   Financial  Resource Strain: Not on file  Food Insecurity: Not on file  Transportation Needs: Not on file  Physical Activity: Not on file  Stress: Not on file  Social Connections: Not on file     Family History: The patient's family history includes Aneurysm in her father; Diabetes in her maternal grandmother. ROS:   Please see the history of present illness.    All other systems reviewed and are negative.  EKGs/Labs/Other Studies Reviewed:    The following studies were reviewed today:  EKG:  EKG ordered today and personally reviewed.  The ekg ordered today demonstrates sinus rhythm normal  Recent Labs: No results found for requested labs within last 365 days.  Recent Lipid Panel    Component Value Date/Time   CHOL 176 01/08/2020 1350   TRIG 71 01/08/2020  1350   HDL 81 01/08/2020 1350   CHOLHDL 2.2 01/08/2020 1350   LDLCALC 82 01/08/2020 1350    Physical Exam:    VS:  BP (!) 146/80 (BP Location: Right Arm, Patient Position: Sitting)   Pulse 67   Ht '5\' 3"'$  (1.6 m)   Wt 161 lb (73 kg)   SpO2 96%   BMI 28.52 kg/m     Wt Readings from Last 3 Encounters:  03/09/22 161 lb (73 kg)  02/07/21 154 lb (69.9 kg)  07/04/20 155 lb (70.3 kg)     GEN:  Well nourished, well developed in no acute distress HEENT: Normal NECK: No JVD; No carotid bruits LYMPHATICS: No lymphadenopathy CARDIAC: RRR, no murmurs, rubs, gallops RESPIRATORY:  Clear to auscultation without rales, wheezing or rhonchi  ABDOMEN: Soft, non-tender, non-distended MUSCULOSKELETAL:  No edema; No deformity  SKIN: Warm and dry NEUROLOGIC:  Alert and oriented x 3 PSYCHIATRIC:  Normal affect    Signed, Shirlee More, MD  03/09/2022 11:40 AM    [place]

## 2022-03-14 ENCOUNTER — Other Ambulatory Visit: Payer: Self-pay | Admitting: Cardiology

## 2022-03-16 DIAGNOSIS — M25562 Pain in left knee: Secondary | ICD-10-CM | POA: Diagnosis not present

## 2022-03-16 DIAGNOSIS — M25572 Pain in left ankle and joints of left foot: Secondary | ICD-10-CM | POA: Diagnosis not present

## 2022-03-16 DIAGNOSIS — M7732 Calcaneal spur, left foot: Secondary | ICD-10-CM | POA: Diagnosis not present

## 2022-03-16 DIAGNOSIS — M7989 Other specified soft tissue disorders: Secondary | ICD-10-CM | POA: Diagnosis not present

## 2022-03-16 DIAGNOSIS — M1712 Unilateral primary osteoarthritis, left knee: Secondary | ICD-10-CM | POA: Diagnosis not present

## 2022-03-16 DIAGNOSIS — S8992XA Unspecified injury of left lower leg, initial encounter: Secondary | ICD-10-CM | POA: Diagnosis not present

## 2022-03-16 DIAGNOSIS — S99912A Unspecified injury of left ankle, initial encounter: Secondary | ICD-10-CM | POA: Diagnosis not present

## 2022-03-17 DIAGNOSIS — C441192 Basal cell carcinoma of skin of left lower eyelid, including canthus: Secondary | ICD-10-CM | POA: Diagnosis not present

## 2022-03-28 DIAGNOSIS — H5789 Other specified disorders of eye and adnexa: Secondary | ICD-10-CM | POA: Diagnosis not present

## 2022-04-02 DIAGNOSIS — R350 Frequency of micturition: Secondary | ICD-10-CM | POA: Diagnosis not present

## 2022-04-02 DIAGNOSIS — R351 Nocturia: Secondary | ICD-10-CM | POA: Diagnosis not present

## 2022-04-02 DIAGNOSIS — N3946 Mixed incontinence: Secondary | ICD-10-CM | POA: Diagnosis not present

## 2022-04-06 DIAGNOSIS — I471 Supraventricular tachycardia: Secondary | ICD-10-CM | POA: Diagnosis not present

## 2022-04-06 DIAGNOSIS — E785 Hyperlipidemia, unspecified: Secondary | ICD-10-CM | POA: Diagnosis not present

## 2022-04-06 DIAGNOSIS — N3941 Urge incontinence: Secondary | ICD-10-CM | POA: Diagnosis not present

## 2022-04-06 DIAGNOSIS — I10 Essential (primary) hypertension: Secondary | ICD-10-CM | POA: Diagnosis not present

## 2022-04-06 DIAGNOSIS — M810 Age-related osteoporosis without current pathological fracture: Secondary | ICD-10-CM | POA: Diagnosis not present

## 2022-04-06 DIAGNOSIS — E039 Hypothyroidism, unspecified: Secondary | ICD-10-CM | POA: Diagnosis not present

## 2022-04-06 DIAGNOSIS — M25572 Pain in left ankle and joints of left foot: Secondary | ICD-10-CM | POA: Diagnosis not present

## 2022-04-06 DIAGNOSIS — Z139 Encounter for screening, unspecified: Secondary | ICD-10-CM | POA: Diagnosis not present

## 2022-04-07 ENCOUNTER — Other Ambulatory Visit: Payer: Self-pay | Admitting: Cardiology

## 2022-04-07 ENCOUNTER — Telehealth: Payer: Self-pay | Admitting: Cardiology

## 2022-04-07 NOTE — Telephone Encounter (Signed)
Pt c/o medication issue:  1. Name of Medication: Myrbetriq ( urologist provided )  2. How are you currently taking this medication (dosage and times per day)? 50 mg   3. Are you having a reaction (difficulty breathing--STAT)? No   4. What is your medication issue? Pt states the side effects of this medication prescribed by this medication lowers bp and also headaches.   Bp readings: yesterday 2:50: 138/66 5:00pm 145/80 Today 645 am 134/72 134/74  She states she would like to know are her bp reading within the normal level and is this medication okay to take.

## 2022-04-07 NOTE — Telephone Encounter (Signed)
She is fine to take the Myrbetriq.  As long as her BP is above 100/60 and she's good.  A drop from 145 to 134 is not any concern.

## 2022-04-08 NOTE — Telephone Encounter (Signed)
Recommdendations reviewed with pt as per Tommy Medal, RPH-CPP's note.  Pt verbalized understanding and had no additional questions.

## 2022-04-22 DIAGNOSIS — M25572 Pain in left ankle and joints of left foot: Secondary | ICD-10-CM | POA: Diagnosis not present

## 2022-04-22 DIAGNOSIS — M79672 Pain in left foot: Secondary | ICD-10-CM | POA: Insufficient documentation

## 2022-04-22 DIAGNOSIS — M25473 Effusion, unspecified ankle: Secondary | ICD-10-CM | POA: Insufficient documentation

## 2022-04-22 DIAGNOSIS — M25472 Effusion, left ankle: Secondary | ICD-10-CM | POA: Diagnosis not present

## 2022-04-22 DIAGNOSIS — M25372 Other instability, left ankle: Secondary | ICD-10-CM | POA: Diagnosis not present

## 2022-04-22 HISTORY — DX: Pain in left foot: M79.672

## 2022-04-22 HISTORY — DX: Effusion, unspecified ankle: M25.473

## 2022-05-05 DIAGNOSIS — M25372 Other instability, left ankle: Secondary | ICD-10-CM | POA: Diagnosis not present

## 2022-05-05 DIAGNOSIS — M25572 Pain in left ankle and joints of left foot: Secondary | ICD-10-CM | POA: Diagnosis not present

## 2022-05-14 DIAGNOSIS — N3946 Mixed incontinence: Secondary | ICD-10-CM | POA: Diagnosis not present

## 2022-05-21 DIAGNOSIS — H18513 Endothelial corneal dystrophy, bilateral: Secondary | ICD-10-CM | POA: Diagnosis not present

## 2022-05-21 DIAGNOSIS — H04123 Dry eye syndrome of bilateral lacrimal glands: Secondary | ICD-10-CM | POA: Diagnosis not present

## 2022-05-21 DIAGNOSIS — H10413 Chronic giant papillary conjunctivitis, bilateral: Secondary | ICD-10-CM | POA: Diagnosis not present

## 2022-06-02 DIAGNOSIS — S62646A Nondisplaced fracture of proximal phalanx of right little finger, initial encounter for closed fracture: Secondary | ICD-10-CM | POA: Diagnosis not present

## 2022-06-12 DIAGNOSIS — K219 Gastro-esophageal reflux disease without esophagitis: Secondary | ICD-10-CM | POA: Diagnosis not present

## 2022-06-12 DIAGNOSIS — K222 Esophageal obstruction: Secondary | ICD-10-CM | POA: Diagnosis not present

## 2022-06-12 DIAGNOSIS — S62646A Nondisplaced fracture of proximal phalanx of right little finger, initial encounter for closed fracture: Secondary | ICD-10-CM | POA: Diagnosis not present

## 2022-06-24 DIAGNOSIS — N3946 Mixed incontinence: Secondary | ICD-10-CM | POA: Diagnosis not present

## 2022-07-03 DIAGNOSIS — S62646A Nondisplaced fracture of proximal phalanx of right little finger, initial encounter for closed fracture: Secondary | ICD-10-CM | POA: Diagnosis not present

## 2022-07-15 DIAGNOSIS — H35373 Puckering of macula, bilateral: Secondary | ICD-10-CM | POA: Diagnosis not present

## 2022-07-15 DIAGNOSIS — H18513 Endothelial corneal dystrophy, bilateral: Secondary | ICD-10-CM | POA: Diagnosis not present

## 2022-07-15 DIAGNOSIS — H52203 Unspecified astigmatism, bilateral: Secondary | ICD-10-CM | POA: Diagnosis not present

## 2022-07-15 DIAGNOSIS — H532 Diplopia: Secondary | ICD-10-CM | POA: Diagnosis not present

## 2022-07-16 ENCOUNTER — Telehealth: Payer: Self-pay | Admitting: Cardiology

## 2022-07-16 NOTE — Telephone Encounter (Signed)
Left vm for pt to callback 

## 2022-07-16 NOTE — Telephone Encounter (Signed)
Pt c/o medication issue:  1. Name of Medication:  Atorvastatin rosuvastatin  2. How are you currently taking this medication (dosage and times per day)? Patient not currently taking either  3. Are you having a reaction (difficulty breathing--STAT)? no  4. What is your medication issue? Rhonda RN with [facility] states she spoke with the patient who said she is not currently taking to atorvastatin due to leg weakness. She says spoke with a pharmacist and they advised the patient try rosuvastatin. She states the patient has said she is interested and if Dr. Bettina Gavia agrees they would like a prescription sent to [facility]. She says she would like a call back to :[phone], request to send her a message or ask Suanne Marker is available.

## 2022-07-17 DIAGNOSIS — I10 Essential (primary) hypertension: Secondary | ICD-10-CM | POA: Diagnosis not present

## 2022-07-17 DIAGNOSIS — E039 Hypothyroidism, unspecified: Secondary | ICD-10-CM | POA: Diagnosis not present

## 2022-07-20 NOTE — Telephone Encounter (Signed)
Recommendations reviewed with pt as per Dr. Munley's note.  Pt verbalized understanding and had no additional questions.  

## 2022-07-21 DIAGNOSIS — K219 Gastro-esophageal reflux disease without esophagitis: Secondary | ICD-10-CM | POA: Diagnosis not present

## 2022-07-21 DIAGNOSIS — R1310 Dysphagia, unspecified: Secondary | ICD-10-CM | POA: Diagnosis not present

## 2022-07-21 DIAGNOSIS — M810 Age-related osteoporosis without current pathological fracture: Secondary | ICD-10-CM | POA: Diagnosis not present

## 2022-09-04 DIAGNOSIS — E785 Hyperlipidemia, unspecified: Secondary | ICD-10-CM | POA: Diagnosis not present

## 2022-09-04 DIAGNOSIS — K219 Gastro-esophageal reflux disease without esophagitis: Secondary | ICD-10-CM | POA: Diagnosis not present

## 2022-09-04 DIAGNOSIS — I509 Heart failure, unspecified: Secondary | ICD-10-CM | POA: Diagnosis not present

## 2022-09-04 DIAGNOSIS — Z7982 Long term (current) use of aspirin: Secondary | ICD-10-CM | POA: Diagnosis not present

## 2022-09-04 DIAGNOSIS — I252 Old myocardial infarction: Secondary | ICD-10-CM | POA: Diagnosis not present

## 2022-09-04 DIAGNOSIS — R609 Edema, unspecified: Secondary | ICD-10-CM | POA: Diagnosis not present

## 2022-09-04 DIAGNOSIS — I110 Hypertensive heart disease with heart failure: Secondary | ICD-10-CM | POA: Diagnosis not present

## 2022-09-04 DIAGNOSIS — E039 Hypothyroidism, unspecified: Secondary | ICD-10-CM | POA: Diagnosis not present

## 2022-09-04 DIAGNOSIS — N3941 Urge incontinence: Secondary | ICD-10-CM | POA: Diagnosis not present

## 2022-09-08 ENCOUNTER — Ambulatory Visit: Payer: Medicare PPO | Attending: Cardiology | Admitting: Cardiology

## 2022-09-08 ENCOUNTER — Encounter: Payer: Self-pay | Admitting: Cardiology

## 2022-09-08 VITALS — BP 138/84 | HR 74 | Ht 63.0 in | Wt 164.0 lb

## 2022-09-08 DIAGNOSIS — G729 Myopathy, unspecified: Secondary | ICD-10-CM | POA: Diagnosis not present

## 2022-09-08 DIAGNOSIS — E782 Mixed hyperlipidemia: Secondary | ICD-10-CM

## 2022-09-08 DIAGNOSIS — I10 Essential (primary) hypertension: Secondary | ICD-10-CM

## 2022-09-08 DIAGNOSIS — I4719 Other supraventricular tachycardia: Secondary | ICD-10-CM

## 2022-09-08 DIAGNOSIS — I201 Angina pectoris with documented spasm: Secondary | ICD-10-CM | POA: Diagnosis not present

## 2022-09-08 MED ORDER — VERAPAMIL HCL ER 180 MG PO TBCR: 180.0000 mg | EXTENDED_RELEASE_TABLET | Freq: Two times a day (BID) | ORAL | 3 refills | Status: DC

## 2022-09-08 NOTE — Patient Instructions (Signed)
Medication Instructions:  Your physician recommends that you continue on your current medications as directed. Please refer to the Current Medication list given to you today.  *If you need a refill on your cardiac medications before your next appointment, please call your pharmacy*   Lab Work: NONE If you have labs (blood work) drawn today and your tests are completely normal, you will receive your results only by: MyChart Message (if you have MyChart) OR A paper copy in the mail If you have any lab test that is abnormal or we need to change your treatment, we will call you to review the results.   Testing/Procedures: NONE   Follow-Up: At [facility], you and your health needs are our priority.  As part of our continuing mission to provide you with exceptional heart care, we have created designated Provider Care Teams.  These Care Teams include your primary Cardiologist (physician) and Advanced Practice Providers (APPs -  Physician Assistants and Nurse Practitioners) who all work together to provide you with the care you need, when you need it.  We recommend signing up for the patient portal called "MyChart".  Sign up information is provided on this After Visit Summary.  MyChart is used to connect with patients for Virtual Visits (Telemedicine).  Patients are able to view lab/test results, encounter notes, upcoming appointments, etc.  Non-urgent messages can be sent to your provider as well.   To learn more about what you can do with MyChart, go to [email or web address].    Your next appointment:   6 month(s)  The format for your next appointment:   In Person  Provider:   Brian Munley, MD    Other Instructions   Important Information About Sugar       

## 2022-09-08 NOTE — Progress Notes (Signed)
Cardiology Office Note:    Date:  09/08/2022   ID:  Melissa Lozano, DOB 1941/05/21, MRN [id number]  PCP:  Nicoletta Dress, MD  Cardiologist:  Shirlee More, MD    Referring MD: Nicoletta Dress, MD    ASSESSMENT:    1. Myopathy   2. 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 Essential hypertension   4. Mixed hyperlipidemia   5. PAT (paroxysmal atrial tachycardia)    PLAN:    In order of problems listed above:  Although this began as a cardiology follow-up her primary concern is myopathy if clearly has progressed and interferes with her life she has had falls with trauma and is concerned she will not be able to stay in her current apartment.  I think she should see a neurologist and I strongly encouraged her not to go back on lipid-lowering therapy. Stable no angina continue medical treatment including a calcium channel blocker Well-controlled continue with current treatment diuretic.  Calcium channel She has had no recurrent atrial tachycardia   Next appointment: 6 months   Medication Adjustments/Labs and Tests Ordered: Current medicines are reviewed at length with the patient today.  Concerns regarding medicines are outlined above.  Orders Placed This Encounter  Procedures   Ambulatory referral to Neurology   Meds ordered this encounter  Medications   verapamil (CALAN-SR) 180 MG CR tablet    Sig: Take 1 tablet (180 mg total) by mouth 2 (two) times daily. Patient must keep appointment for 03/09/22 for further refills. 1 st attempt    Dispense:  180 tablet    Refill:  3    Chief Complaint  Patient presents with   Follow-up  For coronary artery spasm  History of Present Illness:    Melissa Lozano is a 81 y.o. female with a hx of ACS with coronary artery spasm in 2014 hypertension hyperlipidemia and paroxysmal atrial tachycardia last seen 03/09/2022.  Compliance with diet, lifestyle and medications: Yes  Previously we had stopped her statin with muscle weakness.  If  anything she is worse she has trouble getting out of a chair if she falls she cannot get off the ground and she dresses sitting in a chair because of weakness and falls She has had no angina palpitations syncope. She notices mild degree of dependent edema likely related to her calcium channel blocker she does not want to stop because of her previous SVT Past Medical History:  Diagnosis Date   Angina pectoris ([place]) 08/16/2015   Overview:  stable, no recurrence of her present meds and continue same with a CCB, aspirin and a statin. Stress echo negative for ischemia at 10 mets 08/23/13   C. 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 Bernarda Caffey    Current Medications: Current Meds  Medication Sig   aspirin EC 81 MG tablet Take 81 mg by mouth daily.   calcium-vitamin D (OSCAL WITH D) 500-200 MG-UNIT TABS tablet Take 1 tablet by mouth 2 (two) times daily.   cloNIDine (CATAPRES) 0.1 MG tablet Take 0.1 mg by mouth as needed (anxiety).   denosumab (PROLIA) 60 MG/ML SOSY injection Inject 60 mg into the skin every 6 (six) months.   diclofenac Sodium (VOLTAREN) 1 % GEL Apply 2 g topically as needed (pain).    levothyroxine (SYNTHROID) 50 MCG tablet Take 50 mcg by mouth daily.   Multiple Vitamins-Minerals (MULTIPLE VITAMINS/WOMENS PO) Take 1 tablet by mouth daily.   MYRBETRIQ 50 MG TB24 tablet Take 50 mg by mouth daily.   nitroGLYCERIN (NITROSTAT) 0.4 MG SL tablet Place 1 tablet (0.4 mg total) under the tongue every 5 (five) minutes as needed for chest pain.   Omega-3 Fatty Acids (FISH OIL) 1000 MG CAPS Take 1 capsule by mouth daily.   polyethylene glycol powder (GLYCOLAX/MIRALAX) powder MIX 17GMS (1 CAPFUL) IN 8OZ OF LIQUID AND DRINK ONCE DAILY   psyllium (METAMUCIL) 58.6 % packet Take 1 packet by mouth daily.   spironolactone-hydrochlorothiazide (ALDACTAZIDE) 25-25 MG per tablet Take 1 tablet by mouth 3 (three) times a week.    [DISCONTINUED] verapamil (CALAN-SR) 180 MG CR tablet Take 1 tablet (180 mg total) by mouth 2 (two) times daily. Patient must keep appointment for 03/09/22 for further refills. 1 st attempt   Current Facility-Administered Medications for the 09/08/22 encounter (Office Visit) with Richardo Priest, MD  Medication   dexamethasone (DECADRON) injection 4 mg     Allergies:   Sulfa antibiotics, Codeine, Tape, and Wound dressing adhesive   Social History   Socioeconomic History   Marital status: Widowed    Spouse name: Not on file   Number of children: Not on file   Years of education: Not on file   Highest education level: Not on file  Occupational History   Not on file  Tobacco Use   Smoking status: Never   Smokeless tobacco: Never  Vaping Use   Vaping Use: Never used  Substance and Sexual Activity   Alcohol use: Yes    Alcohol/week: 0.0 standard drinks of alcohol    Comment: occasional   Drug use: No   Sexual activity: Not on file  Other Topics Concern   Not on file  Social History Narrative   Not on file   Social Determinants of Health   Financial Resource Strain: Not on file  Food Insecurity: Not on file  Transportation Needs: Not on file  Physical  Activity: Not on file  Stress: Not on file  Social Connections: Not on file     Family History: The patient's family history includes Aneurysm in her father; Diabetes in her maternal grandmother. ROS:   Please see the history of present illness.    All other systems reviewed and are negative.  EKGs/Labs/Other Studies Reviewed:    The following studies were reviewed today:   Recent Labs: 04/06/2022 cholesterol 223 LDL 138 A1c 5.6% hemoglobin 13.7 creatinine 0.83 potassium 4.0 Recent Lipid Panel    Component Value Date/Time   CHOL 176 01/08/2020 1350   TRIG 71 01/08/2020 1350   HDL 81 01/08/2020 1350   CHOLHDL 2.2 01/08/2020 1350   LDLCALC  82 01/08/2020 1350    Physical Exam:    VS:  BP 138/84   Pulse 74   Ht '5\' 3"'$  (1.6 m)   Wt 164 lb (74.4 kg)   SpO2 98%   BMI 29.05 kg/m     Wt Readings from Last 3 Encounters:  09/08/22 164 lb (74.4 kg)  03/09/22 161 lb (73 kg)  02/07/21 154 lb (69.9 kg)     GEN:  Well nourished, well developed in no acute distress HEENT: Normal NECK: No JVD; No carotid bruits LYMPHATICS: No lymphadenopathy CARDIAC: RRR, no murmurs, rubs, gallops RESPIRATORY:  Clear to auscultation without rales, wheezing or rhonchi  ABDOMEN: Soft, non-tender, non-distended MUSCULOSKELETAL:  No edema; No deformity  SKIN: Warm and dry NEUROLOGIC:  Alert and oriented x 3 PSYCHIATRIC:  Normal affect    Signed, Shirlee More, MD  09/08/2022 11:58 AM    [facility]

## 2022-09-09 DIAGNOSIS — E785 Hyperlipidemia, unspecified: Secondary | ICD-10-CM | POA: Diagnosis not present

## 2022-09-09 DIAGNOSIS — E039 Hypothyroidism, unspecified: Secondary | ICD-10-CM | POA: Diagnosis not present

## 2022-09-24 NOTE — Progress Notes (Signed)
Initial neurology clinic note  SERVICE DATE: 09/25/22  Reason for Evaluation: Consultation requested by Richardo Priest, MD for an opinion regarding myopathy. My final recommendations will be communicated back to the requesting physician by way of shared medical record or letter to requesting physician via [place] mail.  HPI: This is Ms. Melissa Lozano, a 82 y.o. right-handed female with a medical history of hypothyroidism, HTN, HLD, paroxysmal atrial tachcardia, coronary artery spasm c/b MI, migraine, retinal detachment ([place]) who presents to neurology clinic with the chief complaint of weakness in legs. The patient is alone today.  Patient is having weakness in her legs and hands. In her hands, she mentions distal weakness, difficulty opening things. She denies proximal weakness in her arms. She has been having weakness in her legs for about 1 year. She has difficulty getting out of a chair or getting off the ground. She has fallen twice in the last year. One of the falls was when she turned her ankle on the sidewalk. She had a sprain of the left ankle. It is still swollen. This was about 1 year ago. In 06/2022, she stubbed her toe on the sidewalk and fell. She fractured her right hand in the fall. She does not currently walk with an assistive device. She has a cane, but feels this trips her up more than anything. She denies muscle pain. She denies numbness or tingling. She denies muscle atrophy. She has gained about 10 pounds over the last year.  She recently was found to have an elevated TSH. She has been taking synthroid. She had lab work in 08/2022 that showed her thyroid was normal. I do not have any of these results.  She was on atorvastatin for HLD for a long time. She was told to stop. She is not sure if she did. She recently started Zetia (taken about 3 doses).   Patient lives in an independent living facility in her own apartment. She has an Media planner. She is independent of ADLs.  EtOH use:  Drink socially  Restrictive diet? No Family history of neuropathy/myopathy/NM disease? No   MEDICATIONS:  Outpatient Encounter Medications as of 09/25/2022  Medication Sig   aspirin EC 81 MG tablet Take 81 mg by mouth daily.   calcium-vitamin D (OSCAL WITH D) 500-200 MG-UNIT TABS tablet Take 1 tablet by mouth 2 (two) times daily.   cloNIDine (CATAPRES) 0.1 MG tablet Take 0.1 mg by mouth as needed (anxiety).   denosumab (PROLIA) 60 MG/ML SOSY injection Inject 60 mg into the skin every 6 (six) months.   diclofenac Sodium (VOLTAREN) 1 % GEL Apply 2 g topically as needed (pain).   ezetimibe (ZETIA) 10 MG tablet Take 10 mg by mouth daily.   famotidine (PEPCID) 40 MG tablet Take 40 mg by mouth daily.   levothyroxine (SYNTHROID) 50 MCG tablet Take 50 mcg by mouth daily.   Multiple Vitamins-Minerals (MULTIPLE VITAMINS/WOMENS PO) Take 1 tablet by mouth daily.   MYRBETRIQ 50 MG TB24 tablet Take 50 mg by mouth daily.   nitroGLYCERIN (NITROSTAT) 0.4 MG SL tablet Place 1 tablet (0.4 mg total) under the tongue every 5 (five) minutes as needed for chest pain.   Omega-3 Fatty Acids (FISH OIL) 1000 MG CAPS Take 1 capsule by mouth daily.   pantoprazole (PROTONIX) 40 MG tablet Take 40 mg by mouth daily.   polyethylene glycol powder (GLYCOLAX/MIRALAX) powder MIX 17GMS (1 CAPFUL) IN 8OZ OF LIQUID AND DRINK ONCE DAILY   psyllium (METAMUCIL) 58.6 % packet  Take 1 packet by mouth daily.   spironolactone-hydrochlorothiazide (ALDACTAZIDE) 25-25 MG per tablet Take 1 tablet by mouth 3 (three) times a week.    verapamil (CALAN-SR) 180 MG CR tablet Take 1 tablet (180 mg total) by mouth 2 (two) times daily. Patient must keep appointment for 03/09/22 for further refills. 1 st attempt   Vitamins-Lipotropics (LIPOFLAVOVIT PO) Take 1 tablet by mouth 2 (two) times daily. (Patient not taking: Reported on 09/08/2022)   Facility-Administered Encounter Medications as of 09/25/2022  Medication   dexamethasone (DECADRON) injection 4  mg    PAST MEDICAL HISTORY: Past Medical History:  Diagnosis Date   Angina pectoris ([place]) 08/16/2015   Overview:  stable, no recurrence of her present meds and continue same with a CCB, aspirin and a statin. Stress echo negative for ischemia at 10 mets 08/23/13   C. 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 Bernarda Caffey    ALLERGIES: Allergies  Allergen Reactions   Sulfa Antibiotics Nausea Only and Nausea And Vomiting    Other reaction(s): Vomiting Other reaction(s): Vomiting    Codeine Itching   Tape Itching   Wound Dressing Adhesive Itching    FAMILY HISTORY: Family History  Problem Relation Age of Onset   Aneurysm Father    Diabetes Maternal Grandmother     SOCIAL HISTORY: Social History   Tobacco Use   Smoking status: Never   Smokeless tobacco: Never  Vaping Use   Vaping Use: Never used  Substance Use Topics   Alcohol use: Yes    Alcohol/week: 0.0 standard drinks of alcohol    Comment: occasional   Drug use: No   Social History   Social History Narrative   Are you right handed or left handed? Right   Are you currently employed ?    What is  your current occupation?retire   Do you live at home alone? Retirement home   Who lives with you?    What type of home do you live in: 1 story or 2 story? one         OBJECTIVE: PHYSICAL EXAM: BP (!) 163/87   Pulse (!) 103   Ht '5\' 4"'$  (1.626 m)   Wt 168 lb (76.2 kg)   SpO2 98%   BMI 28.84 kg/m   General: General appearance: Awake and alert. No distress. Cooperative with exam.  Skin: No obvious rash or jaundice. HEENT: Atraumatic. Anicteric. Lungs: Non-labored breathing on room air  Extremities: No edema. No obvious deformity.  Musculoskeletal: No obvious joint swelling. Psych: Affect appropriate.  Neurological: Mental Status: Alert. Speech fluent. No pseudobulbar  affect Cranial Nerves: CNII: No RAPD. Visual fields grossly intact. CNIII, IV, VI: PERRL. No nystagmus. EOMI. CN V: Facial sensation intact bilaterally to fine touch. CN VII: Facial muscles symmetric and strong. No ptosis at rest. CN VIII: Hearing grossly intact bilaterally. CN IX: No hypophonia. CN X: Palate elevates symmetrically. CN XI: Full strength shoulder shrug bilaterally. CN XII: Tongue protrusion full and midline. No atrophy or fasciculations. No significant dysarthria Motor: Tone is normal. No fasciculations in any extremities. ?atrophy of bilateral forearms.  Individual muscle group testing (MRC grade out of 5):  Movement     Neck flexion 5    Neck extension 5     Right Left   Shoulder abduction 5- 5-   Shoulder adduction 5 5   Shoulder ext rotation 5- 5-   Shoulder int rotation 5- 5-   Elbow flexion 5 5   Elbow extension 5 5   Finger abduction - FDI 5 5   Finger abduction - ADM 4+ 5 Fracture of right 5th digit  Finger extension 5 5   Finger distal flexion - 2/'3 5 5   '$ Finger distal flexion - 4/'5 5 5   '$ Thumb flexion - FPL 5 5   Thumb abduction - APB 4 4    Hip flexion 4- 4-   Hip extension 5 5   Hip adduction 5 5   Hip abduction 5 5   Knee extension 5 5   Knee flexion 5 5    Dorsiflexion 5 5   Plantarflexion 5 5   Great toe extension 5 5   Great toe flexion 5 5     Reflexes:  Right Left   Bicep 2+-3+ 2+-3+   Tricep 2+-3+ 2+-3+   BrRad 2+-3+ 2+-3+   Knee 2+ 2+   Ankle 2+ 2+    Pathological Reflexes: Babinski: mute response bilaterally Hoffman: absent bilaterally Troemner: absent bilaterally Sensation: Pinprick: Intact in all extremities Vibration: Intact in all extremities Proprioception: Intact in bilateral great toes Coordination: Intact finger-to- nose-finger bilaterally. Gait: Unable to rise from chair with arms crossed unassisted. Wide-based, waddling gait.   Lab and Test Review: No recent labs or imaging I can see. Per Dr. Joya Gaskins note from 09/08/22: HbA1c: 5.6% TSH: 5.19  ASSESSMENT: NASIA CANNAN is a 82 y.o. female who presents for evaluation of muscle weakness. She has a relevant medical history of hypothyroidism, HTN, HLD, paroxysmal atrial tachcardia, coronary artery spasm c/b MI, migraine, retinal detachment ([place]). Her neurological examination is pertinent for proximal muscle weakness of the lower extremities and distal weakness of the upper extremities. Available diagnostic data is significant for HbA1c of 5.6% and TSH of 5.19 per reports. Patient states more recent TSH was normal. Overall, patient's symptoms are concerning for myopathy. Given the distal upper and proximal lower extremity weakness and patient's age, inclusion body myositis is a consideration. Patient was also on a statin, so statin induced myopathy is possible. She does not have much pain, but other autoimmune myositis is also possible. I will work up as below.  PLAN: -Stop atorvastatin and zetia for now until cause of symptoms is determined -Blood work: CK, aldolase, HMGCoA reductase abs, myositis panel -Get TSH results and notes from Dr. Delena Bali -EMG: myopathy protocol (either side) - 10/12/22 at 10 am -Physical therapy   -Return to clinic in 1 month  The  impression above as well as the plan as outlined below were extensively discussed with the patient who voiced understanding. All questions were answered to their  satisfaction.  The patient was counseled on pertinent fall precautions per the printed material provided today, and as noted under the "Patient Instructions" section below.  When available, results of the above investigations and possible further recommendations will be communicated to the patient via telephone/MyChart. Patient to call office if not contacted after expected testing turnaround time.   Total time spent reviewing records, interview, history/exam, documentation, and coordination of care on day of encounter:  50 min   Thank you for allowing me to participate in patient's care.  If I can answer any additional questions, I would be pleased to do so.  Kai Levins, MD   CC: Nicoletta Dress, MD [address] [place] [address]  CC: Referring provider: Richardo Priest, MD [address] [place],  [place] [address]

## 2022-09-25 ENCOUNTER — Ambulatory Visit: Payer: Medicare PPO | Admitting: Neurology

## 2022-09-25 ENCOUNTER — Encounter: Payer: Self-pay | Admitting: Neurology

## 2022-09-25 ENCOUNTER — Other Ambulatory Visit (INDEPENDENT_AMBULATORY_CARE_PROVIDER_SITE_OTHER): Payer: Medicare PPO

## 2022-09-25 VITALS — BP 163/87 | HR 103 | Ht 64.0 in | Wt 168.0 lb

## 2022-09-25 DIAGNOSIS — W19XXXS Unspecified fall, sequela: Secondary | ICD-10-CM | POA: Diagnosis not present

## 2022-09-25 DIAGNOSIS — G729 Myopathy, unspecified: Secondary | ICD-10-CM | POA: Diagnosis not present

## 2022-09-25 DIAGNOSIS — R29898 Other symptoms and signs involving the musculoskeletal system: Secondary | ICD-10-CM

## 2022-09-25 DIAGNOSIS — E039 Hypothyroidism, unspecified: Secondary | ICD-10-CM

## 2022-09-25 NOTE — Patient Instructions (Addendum)
I am concerned you have a muscle problem (myopathy). I want to investigate further. -Blood work today -Muscle and nerve testing (EMG) (see more information below). We will do this on 10/12/22 at 10 am.  STOP atorvastatin and zetia for now until we can determine whether this is contributing to your weakness.  I recommend physical therapy to help get your stronger  I will be in touch when I have your results.  I want to see you back in clinic in 1 month to discuss next steps.  The physicians and staff at [facility] are committed to providing excellent care. You may receive a survey requesting feedback about your experience at our office. We strive to receive "very good" responses to the survey questions. If you feel that your experience would prevent you from giving the office a "very good " response, please contact our office to try to remedy the situation. We may be reached at [phone]. Thank you for taking the time out of your busy day to complete the survey.  Kai Levins, MD [facility]  ELECTROMYOGRAM AND NERVE CONDUCTION STUDIES (EMG/NCS) INSTRUCTIONS  How to Prepare The neurologist conducting the EMG will need to know if you have certain medical conditions. Tell the neurologist and other EMG lab personnel if you: Have a pacemaker or any other electrical medical device Take blood-thinning medications Have hemophilia, a blood-clotting disorder that causes prolonged bleeding Bathing Take a shower or bath shortly before your exam in order to remove oils from your skin. Don't apply lotions or creams before the exam.  What to Expect You'll likely be asked to change into a hospital gown for the procedure and lie down on an examination table. The following explanations can help you understand what will happen during the exam.  Electrodes. The neurologist or a technician places surface electrodes at various locations on your skin depending on where you're experiencing  symptoms. Or the neurologist may insert needle electrodes at different sites depending on your symptoms.  Sensations. The electrodes will at times transmit a tiny electrical current that you may feel as a twinge or spasm. The needle electrode may cause discomfort or pain that usually ends shortly after the needle is removed. If you are concerned about discomfort or pain, you may want to talk to the neurologist about taking a short break during the exam.  Instructions. During the needle EMG, the neurologist will assess whether there is any spontaneous electrical activity when the muscle is at rest - activity that isn't present in healthy muscle tissue - and the degree of activity when you slightly contract the muscle.  He or she will give you instructions on resting and contracting a muscle at appropriate times. Depending on what muscles and nerves the neurologist is examining, he or she may ask you to change positions during the exam.  After your EMG You may experience some temporary, minor bruising where the needle electrode was inserted into your muscle. This bruising should fade within several days. If it persists, contact your primary care doctor.   Preventing Falls at [facility] are common, often dreaded events in the lives of older people. Aside from the obvious injuries and even death that may result, fall can cause wide-ranging consequences including loss of independence, mental decline, decreased activity and mobility. Younger people are also at risk of falling, especially those with chronic illnesses and fatigue.  Ways to reduce risk for falling Examine diet and medications. Warm foods and alcohol dilate blood vessels, which  can lead to dizziness when standing. Sleep aids, antidepressants and pain medications can also increase the likelihood of a fall.  Get a vision exam. Poor vision, cataracts and glaucoma increase the chances of falling.  Check foot gear. Shoes should fit snugly and  have a sturdy, nonskid sole and a broad, low heel  Participate in a physician-approved exercise program to build and maintain muscle strength and improve balance and coordination. Programs that use ankle weights or stretch bands are excellent for muscle-strengthening. Water aerobics programs and low-impact Tai Chi programs have also been shown to improve balance and coordination.  Increase vitamin D intake. Vitamin D improves muscle strength and increases the amount of calcium the body is able to absorb and deposit in bones.  How to prevent falls from common hazards Floors - Remove all loose wires, cords, and throw rugs. Minimize clutter. Make sure rugs are anchored and smooth. Keep furniture in its usual place.  Chairs -- Use chairs with straight backs, armrests and firm seats. Add firm cushions to existing pieces to add height.  Bathroom - Install grab bars and non-skid tape in the tub or shower. Use a bathtub transfer bench or a shower chair with a back support Use an elevated toilet seat and/or safety rails to assist standing from a low surface. Do not use towel racks or bathroom tissue holders to help you stand.  Lighting - Make sure halls, stairways, and entrances are well-lit. Install a night light in your bathroom or hallway. Make sure there is a light switch at the top and bottom of the staircase. Turn lights on if you get up in the middle of the night. Make sure lamps or light switches are within reach of the bed if you have to get up during the night.  Kitchen - Install non-skid rubber mats near the sink and stove. Clean spills immediately. Store frequently used utensils, pots, pans between waist and eye level. This helps prevent reaching and bending. Sit when getting things out of lower cupboards.  Living room/ Bedrooms - Place furniture with wide spaces in between, giving enough room to move around. Establish a route through the living room that gives you something to hold onto as you  walk.  Stairs - Make sure treads, rails, and rugs are secure. Install a rail on both sides of the stairs. If stairs are a threat, it might be helpful to arrange most of your activities on the lower level to reduce the number of times you must climb the stairs.  Entrances and doorways - Install metal handles on the walls adjacent to the doorknobs of all doors to make it more secure as you travel through the doorway.  Tips for maintaining balance Keep at least one hand free at all times. Try using a backpack or fanny pack to hold things rather than carrying them in your hands. Never carry objects in both hands when walking as this interferes with keeping your balance.  Attempt to swing both arms from front to back while walking. This might require a conscious effort if Parkinson's disease has diminished your movement. It will, however, help you to maintain balance and posture, and reduce fatigue.  Consciously lift your feet off of the ground when walking. Shuffling and dragging of the feet is a common culprit in losing your balance.  When trying to navigate turns, use a "U" technique of facing forward and making a wide turn, rather than pivoting sharply.  Try to stand with your feet shoulder-length  apart. When your feet are close together for any length of time, you increase your risk of losing your balance and falling.  Do one thing at a time. Don't try to walk and accomplish another task, such as reading or looking around. The decrease in your automatic reflexes complicates motor function, so the less distraction, the better.  Do not wear rubber or gripping soled shoes, they might "catch" on the floor and cause tripping.  Move slowly when changing positions. Use deliberate, concentrated movements and, if needed, use a grab bar or walking aid. Count 15 seconds between each movement. For example, when rising from a seated position, wait 15 seconds after standing to begin walking.  If balance is  a continuous problem, you might want to consider a walking aid such as a cane, walking stick, or walker. Once you've mastered walking with help, you might be ready to try it on your own again.

## 2022-09-28 DIAGNOSIS — G548 Other nerve root and plexus disorders: Secondary | ICD-10-CM | POA: Diagnosis not present

## 2022-09-28 DIAGNOSIS — Z85828 Personal history of other malignant neoplasm of skin: Secondary | ICD-10-CM | POA: Diagnosis not present

## 2022-09-28 DIAGNOSIS — L738 Other specified follicular disorders: Secondary | ICD-10-CM | POA: Diagnosis not present

## 2022-09-29 DIAGNOSIS — K219 Gastro-esophageal reflux disease without esophagitis: Secondary | ICD-10-CM | POA: Diagnosis not present

## 2022-09-29 DIAGNOSIS — R1310 Dysphagia, unspecified: Secondary | ICD-10-CM | POA: Diagnosis not present

## 2022-10-01 NOTE — Addendum Note (Signed)
Addended by: Renae Gloss on: 10/01/2022 09:05 AM   Modules accepted: Orders

## 2022-10-01 NOTE — Addendum Note (Signed)
Addended by: Venetia Night on: 10/01/2022 09:03 AM   Modules accepted: Orders

## 2022-10-06 DIAGNOSIS — R1310 Dysphagia, unspecified: Secondary | ICD-10-CM | POA: Diagnosis not present

## 2022-10-06 DIAGNOSIS — K219 Gastro-esophageal reflux disease without esophagitis: Secondary | ICD-10-CM | POA: Diagnosis not present

## 2022-10-06 DIAGNOSIS — K449 Diaphragmatic hernia without obstruction or gangrene: Secondary | ICD-10-CM | POA: Diagnosis not present

## 2022-10-06 DIAGNOSIS — K224 Dyskinesia of esophagus: Secondary | ICD-10-CM | POA: Diagnosis not present

## 2022-10-07 DIAGNOSIS — E785 Hyperlipidemia, unspecified: Secondary | ICD-10-CM | POA: Diagnosis not present

## 2022-10-07 DIAGNOSIS — H6121 Impacted cerumen, right ear: Secondary | ICD-10-CM | POA: Diagnosis not present

## 2022-10-07 DIAGNOSIS — E039 Hypothyroidism, unspecified: Secondary | ICD-10-CM | POA: Diagnosis not present

## 2022-10-07 DIAGNOSIS — Z1331 Encounter for screening for depression: Secondary | ICD-10-CM | POA: Diagnosis not present

## 2022-10-09 ENCOUNTER — Other Ambulatory Visit (INDEPENDENT_AMBULATORY_CARE_PROVIDER_SITE_OTHER): Payer: Medicare PPO

## 2022-10-09 DIAGNOSIS — R29898 Other symptoms and signs involving the musculoskeletal system: Secondary | ICD-10-CM | POA: Diagnosis not present

## 2022-10-09 DIAGNOSIS — E039 Hypothyroidism, unspecified: Secondary | ICD-10-CM | POA: Diagnosis not present

## 2022-10-09 DIAGNOSIS — G729 Myopathy, unspecified: Secondary | ICD-10-CM

## 2022-10-09 DIAGNOSIS — W19XXXS Unspecified fall, sequela: Secondary | ICD-10-CM

## 2022-10-09 LAB — CK: Total CK: 62 U/L (ref 7–177)

## 2022-10-12 ENCOUNTER — Ambulatory Visit: Payer: Medicare PPO | Admitting: Neurology

## 2022-10-12 ENCOUNTER — Telehealth: Payer: Self-pay

## 2022-10-12 ENCOUNTER — Telehealth: Payer: Self-pay | Admitting: Neurology

## 2022-10-12 DIAGNOSIS — G729 Myopathy, unspecified: Secondary | ICD-10-CM

## 2022-10-12 DIAGNOSIS — W19XXXS Unspecified fall, sequela: Secondary | ICD-10-CM

## 2022-10-12 DIAGNOSIS — R29898 Other symptoms and signs involving the musculoskeletal system: Secondary | ICD-10-CM | POA: Diagnosis not present

## 2022-10-12 DIAGNOSIS — E039 Hypothyroidism, unspecified: Secondary | ICD-10-CM

## 2022-10-12 LAB — TIQ- AMBIGUOUS ORDER

## 2022-10-12 NOTE — Telephone Encounter (Signed)
Patient was supposed to receive blood work, the lab downstairs was unable to perform the test and notified us to send orders to [facility] on [address]. Patient went by [facility] and they do not have any orders. She asked if we can re-send these over so she is able to have these done.

## 2022-10-12 NOTE — Telephone Encounter (Signed)
Melissa Lozano has resent orders to [facility]

## 2022-10-12 NOTE — Telephone Encounter (Signed)
Discussed the results of patient's EMG after the procedure today. EMG showed no evidence of myopathy or polyneuropathy. 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   I explained that I did not currently have an explanation for her weakness, particularly in the legs, but recommended she not continue the statin for cholesterol at this time. Labs are pending, which may given further insight. She has a follow up appointment on 11/05/21.  All questions were answered.  Kai Levins, MD [facility]

## 2022-10-12 NOTE — Procedures (Signed)
[facility]  [place], [place]  [place], [place] [address] Tel: [phone] Fax: [phone] Test Date:  10/12/2022  Patient: Melissa Lozano DOB: [birth year] Physician: Kai Levins, MD  Sex: Female Height: '5\' 4"'$  Ref Phys: Kai Levins, MD  ID#: [id number]   Technician:    History: This is an 82 year old female with muscle weakness.  NCV & EMG Findings: Extensive electrodiagnostic evaluation of the right upper and lower limbs show: Right sural and right median sensory responses are within normal limits. Right peroneal/fibular (EDB) and right median (ABP) motor responses are within normal limits. Chronic motor axon loss changes without accompanying active denervation changes are seen in the right first dorsal interosseous, right extensor indicis proprius, and right flexor digitorum longus muscles.  Impression: This is an abnormal electrodiagnostic evaluation. The findings are most consistent with the following: No electrodiagnostic evidence of myopathy. The residuals of an old intraspinal canal lesion (ie: motor radiculopathy) at the right C8 root, mild in degree electrically. No electrodiagnostic evidence of a right lumbosacral (L2-S1) motor radiculopathy. No electrodiagnostic evidence of a right median mononeuropathy at or distal to the wrist, ie carpal tunnel syndrome.    ___________________________ Kai Levins, MD    Nerve Conduction Studies Motor Nerve Results    Latency Amplitude F-Lat Segment Distance CV Comment  Site (ms) Norm (mV) Norm (ms)  (cm) (m/s) Norm   Right Fibular (EDB) Motor  Ankle 4.6  < 6.0 2.5  > 2.5        Bel fib head 11.7 - 2.1 -  Bel fib head-Ankle 33 46  > 40   Pop fossa 13.5 - 2.1 -  Pop fossa-Bel fib head 7 39 -   Right Median (APB) Motor  Wrist 2.9  < 4.0 7.2  > 5.0        Elbow 8.4 - 6.5 -  Elbow-Wrist 29.5 54  > 50    Sensory Sites    Neg Peak Lat Amplitude (O-P) Segment Distance Velocity Comment  Site (ms) Norm (V)  Norm  (cm) (ms)   Right Median Sensory  Wrist-Dig II 3.4  < 3.8 23  > 10 Wrist-Dig II 13    Right Sural Sensory  Calf-Lat mall 3.9  < 4.6 6  > 3 Calf-Lat mall 14     Electromyography   Side Muscle Ins.Act Fibs Fasc Recrt Amp Dur Poly Activation Comment  Right Tib ant Nml Nml Nml Nml Nml Nml Nml Nml N/A  Right Gastroc MH Nml Nml Nml Nml Nml Nml Nml Nml N/A  Right Vastus lat Nml Nml Nml Nml Nml Nml Nml Nml N/A  Right Rectus fem Nml Nml Nml Nml Nml Nml Nml Nml N/A  Right Iliacus Nml Nml Nml Nml Nml Nml Nml Nml N/A  Right Gluteus med Nml Nml Nml Nml Nml Nml Nml Nml N/A  Right C7 PSP Nml Nml Nml Nml Nml Nml Nml Nml N/A  Right Infraspin Nml Nml Nml Nml Nml Nml Nml Nml N/A  Right FDI Nml Nml Nml *1- *1+ *1+ *1+ Nml N/A  Right EIP Nml Nml Nml *1- *1+ *1+ Nml Nml N/A  Right FPL Nml Nml Nml *1- *1+ *1+ Nml Nml N/A  Right Pronator teres Nml Nml Nml Nml Nml Nml Nml Nml N/A  Right Biceps Nml Nml Nml Nml Nml Nml Nml Nml N/A  Right Triceps lat hd Nml Nml Nml Nml Nml Nml Nml Nml N/A  Right Deltoid Nml Nml Nml Nml Nml Nml Nml Nml  N/A      Waveforms:  Motor      Sensory

## 2022-10-15 LAB — ANTI-HMGCR AB (RDL): Anti-HMGCR Ab (RDL)^: <20 Units (ref <20)

## 2022-10-16 DIAGNOSIS — M6281 Muscle weakness (generalized): Secondary | ICD-10-CM | POA: Diagnosis not present

## 2022-10-16 DIAGNOSIS — R2689 Other abnormalities of gait and mobility: Secondary | ICD-10-CM | POA: Diagnosis not present

## 2022-10-19 LAB — EXTENDED MYOSITIS SPECIFIC ANTIBODY (MSA) PANEL
CYTOSOLIC 5' NUCLEOTIDASE 1A (cN 1A) AB (IGG): 6 Units
EJ AB: <11 SI (ref <11)
HMGCR AB (IGG): <2 CU (ref <20)
JO-1 AB: <11 SI (ref <11)
MDA-5 AB: <11 SI (ref <11)
MI-2 ALPHA AB: <11 SI (ref <11)
MI-2 BETA AB: <11 SI (ref <11)
NXP-2 AB: <11 SI (ref <11)
OJ AB: <11 SI (ref <11)
PL-12 AB: <11 SI (ref <11)
PL-7 AB: <11 SI (ref <11)
SRP-AB: <11 SI (ref <11)
TIF-1y AB: <11 SI (ref <11)

## 2022-10-19 LAB — ALDOLASE: Aldolase: 4.2 U/L (ref <=8.1)

## 2022-10-21 DIAGNOSIS — R2689 Other abnormalities of gait and mobility: Secondary | ICD-10-CM | POA: Diagnosis not present

## 2022-10-21 DIAGNOSIS — M6281 Muscle weakness (generalized): Secondary | ICD-10-CM | POA: Diagnosis not present

## 2022-10-21 LAB — MYOSITIS SPECIFIC II ANTIBODIES PANEL
EJ AB: <11 SI (ref <11)
JO-1 AB: <11 SI (ref <11)
MDA-5 AB: <11 SI (ref <11)
MI-2 ALPHA AB: <11 SI (ref <11)
MI-2 BETA AB: <11 SI (ref <11)
NXP-2 AB: <11 SI (ref <11)
OJ AB: <11 SI (ref <11)
PL-12 AB: <11 SI (ref <11)
PL-7 AB: <11 SI (ref <11)
SRP-AB: <11 SI (ref <11)
TIF-1y AB: <11 SI (ref <11)

## 2022-10-28 DIAGNOSIS — R2689 Other abnormalities of gait and mobility: Secondary | ICD-10-CM | POA: Diagnosis not present

## 2022-10-28 DIAGNOSIS — M6281 Muscle weakness (generalized): Secondary | ICD-10-CM | POA: Diagnosis not present

## 2022-10-29 DIAGNOSIS — R1310 Dysphagia, unspecified: Secondary | ICD-10-CM | POA: Diagnosis not present

## 2022-10-29 DIAGNOSIS — K219 Gastro-esophageal reflux disease without esophagitis: Secondary | ICD-10-CM | POA: Diagnosis not present

## 2022-10-31 NOTE — Progress Notes (Unsigned)
I saw Melissa Lozano in neurology clinic on 11/05/22 in follow up for leg weakness.  HPI: Melissa Lozano is a 82 y.o. year old female with a history of hypothyroidism, HTN, HLD, paroxysmal atrial tachcardia, coronary artery spasm c/b MI, migraine, retinal detachment (OS ) who we last saw on 09/25/22.  To briefly review: Patient is having weakness in her legs and hands. In her hands, she mentions distal weakness, difficulty opening things. She denies proximal weakness in her arms. She has been having weakness in her legs for about 1 year. She has difficulty getting out of a chair or getting off the ground. She has fallen twice in the last year. One of the falls was when she turned her ankle on the sidewalk. She had a sprain of the left ankle. It is still swollen. This was about 1 year ago. In 06/2022, she stubbed her toe on the sidewalk and fell. She fractured her right hand in the fall. She does not currently walk with an assistive device. She has a cane, but feels this trips her up more than anything. She denies muscle pain. She denies numbness or tingling. She denies muscle atrophy. She has gained about 10 pounds over the last year.   Previous labs found her to have an elevated TSH. She has been taking synthroid. She had lab work in 08/2022 that showed her thyroid was normal. I do not have any of these results.   She was on atorvastatin for HLD for a long time. She was told to stop. She is not sure if she did. She recently started Zetia (taken about 3 doses).    Patient lives in an independent living facility in her own apartment. She has an Media planner. She is independent of ADLs.   EtOH use: Drink socially  Restrictive diet? No Family history of neuropathy/myopathy/NM disease? No  Most recent Assessment and Plan (09/25/22): Her neurological examination is pertinent for proximal muscle weakness of the lower extremities and distal weakness of the upper extremities. Available diagnostic data is  significant for HbA1c of 5.6% and TSH of 5.19 per reports. Patient states more recent TSH was normal. Overall, patient's symptoms are concerning for myopathy. Given the distal upper and proximal lower extremity weakness and patient's age, inclusion body myositis is a consideration. Patient was also on a statin, so statin induced myopathy is possible. She does not have much pain, but other autoimmune myositis is also possible. I will work up as below.   PLAN: -Stop atorvastatin and zetia for now until cause of symptoms is determined -Blood work: CK, aldolase, HMGCoA reductase abs, myositis panel -Get TSH results and notes from Dr. Delena Bali -EMG: myopathy protocol (either side) - 10/12/22 at 10 am -Physical therapy  Since their last visit: EMG on 10/12/22 showed no electrodiagnostic evidence of myopathy. There was evidence of the residuals of a right C8 radiculopathy, mild in degree. Labs showed a normal CK (62, similar to 15 years ago), negative myositis panel (see full results below), HMGCR ab negative, aldolase normal.   Patient thinks she is doing a little better with physical therapy. She has not fallen. She has no new complaints today.  She denies muscle pains. She does have some pain in left hip, but only at night.  She is still not taking her statin.   MEDICATIONS:  Outpatient Encounter Medications as of 11/05/2022  Medication Sig   aspirin EC 81 MG tablet Take 81 mg by mouth daily.   calcium-vitamin D (OSCAL  WITH D) 500-200 MG-UNIT TABS tablet Take 1 tablet by mouth 2 (two) times daily.   cloNIDine (CATAPRES) 0.1 MG tablet Take 0.1 mg by mouth as needed (anxiety).   denosumab (PROLIA) 60 MG/ML SOSY injection Inject 60 mg into the skin every 6 (six) months.   diclofenac Sodium (VOLTAREN) 1 % GEL Apply 2 g topically as needed (pain).   ezetimibe (ZETIA) 10 MG tablet Take 10 mg by mouth daily.   famotidine (PEPCID) 40 MG tablet Take 40 mg by mouth daily.   levothyroxine (SYNTHROID) 50  MCG tablet Take 50 mcg by mouth daily.   Multiple Vitamins-Minerals (MULTIPLE VITAMINS/WOMENS PO) Take 1 tablet by mouth daily.   MYRBETRIQ 50 MG TB24 tablet Take 50 mg by mouth daily.   nitroGLYCERIN (NITROSTAT) 0.4 MG SL tablet Place 1 tablet (0.4 mg total) under the tongue every 5 (five) minutes as needed for chest pain.   Omega-3 Fatty Acids (FISH OIL) 1000 MG CAPS Take 1 capsule by mouth daily.   pantoprazole (PROTONIX) 40 MG tablet Take 40 mg by mouth daily.   polyethylene glycol powder (GLYCOLAX/MIRALAX) powder MIX 17GMS (1 CAPFUL) IN 8OZ OF LIQUID AND DRINK ONCE DAILY   psyllium (METAMUCIL) 58.6 % packet Take 1 packet by mouth daily.   spironolactone-hydrochlorothiazide (ALDACTAZIDE) 25-25 MG per tablet Take 1 tablet by mouth 3 (three) times a week.    verapamil (CALAN-SR) 180 MG CR tablet Take 1 tablet (180 mg total) by mouth 2 (two) times daily. Patient must keep appointment for 03/09/22 for further refills. 1 st attempt   Vitamins-Lipotropics (LIPOFLAVOVIT PO) Take 1 tablet by mouth 2 (two) times daily. (Patient not taking: Reported on 09/08/2022)   Facility-Administered Encounter Medications as of 11/05/2022  Medication   dexamethasone (DECADRON) injection 4 mg    PAST MEDICAL HISTORY: Past Medical History:  Diagnosis Date   Angina pectoris ([place]) 08/16/2015   Overview:  stable, no recurrence of her present meds and continue same with a CCB, aspirin and a statin. Stress echo negative for ischemia at 10 mets 08/23/13   C. difficile colitis 06/10/2017   Cataract    Mixed OU   Coronary artery spasm ([place]) 08/16/2015   Essential hypertension 08/16/2015   Hyperlipidemia 08/16/2015   Hypertension    Hypertensive retinopathy    OU   Migraine 06/10/2017   Old MI (myocardial infarction) 06/10/2017   STEMI September 2008, normal coronary arteriography, 0 CTCa Score     Osteoporosis    PAT (paroxysmal atrial tachycardia) 08/16/2015   Retinal detachment    OS    PAST SURGICAL  HISTORY: Past Surgical History:  Procedure Laterality Date   ABDOMINAL HYSTERECTOMY     BUNIONECTOMY     reverse bunionectomy   CARDIAC CATHETERIZATION     CHOLECYSTECTOMY     EYE SURGERY Left 12/24/2020   Pneumatic retinopexy for retinoschisis RD repair - Dr. 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 Bernarda Caffey    ALLERGIES: Allergies  Allergen Reactions   Sulfa Antibiotics Nausea Only and Nausea And Vomiting    Other reaction(s): Vomiting Other reaction(s): Vomiting    Codeine Itching   Tape Itching   Wound Dressing Adhesive Itching    FAMILY HISTORY: Family History  Problem Relation Age of Onset   Aneurysm Father    Diabetes Maternal Grandmother     SOCIAL HISTORY: Social History   Tobacco Use  Smoking status: Never   Smokeless tobacco: Never  Vaping Use   Vaping Use: Never used  Substance Use Topics   Alcohol use: Yes    Alcohol/week: 0.0 standard drinks of alcohol    Comment: occasional   Drug use: No   Social History   Social History Narrative   Are you right handed or left handed? Right   Are you currently employed ?    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 Retirement home   Who lives with you?    What type of home do you live in: 1 story or 2 story? one        Objective:  Vital Signs:  BP (!) 168/80   Pulse 71   Ht 5' 4"$  (1.626 m)   Wt 168 lb (76.2 kg)   SpO2 97%   BMI 28.84 kg/m   General: General appearance: Awake and alert. No distress. Cooperative with exam.  HEENT: Atraumatic. Anicteric. Lungs: Non-labored breathing on room air   Neurological: Mental Status: Alert. Speech fluent. No pseudobulbar affect Cranial Nerves: CNII: No RAPD. Visual fields intact. CNIII, IV, VI: PERRL. No nystagmus. EOMI. CN V: Facial sensation intact bilaterally to fine touch. CN VII: Facial muscles symmetric and  strong. No ptosis at rest. CN VIII: Hears finger rub well bilaterally. CN IX: No hypophonia. CN X: Palate elevates symmetrically. CN XI: Full strength shoulder shrug bilaterally. CN XII: Tongue protrusion full and midline. No atrophy or fasciculations. No significant dysarthria Motor: Tone is normal.  Individual muscle group testing (MRC grade out of 5):  Movement     Neck flexion 5    Neck extension 5     Right Left   Shoulder abduction 5 5   Elbow flexion 5 5   Elbow extension 5 5   Finger extension 5 5   Finger distal flexion - 2/3 5 5   $ Finger distal flexion - 4/5 5 5   $ Thumb flexion - FPL 5 5   Thumb abduction - APB 5- 5-    Hip flexion 4 4   Hip extension 5 5   Hip adduction 5 5   Hip abduction 5 5   Knee extension 5 5   Knee flexion 5 5   Dorsiflexion 5 5   Plantarflexion 5 5     Reflexes:  Right Left  Bicep 3+ 3+  Tricep 3+ 3+  BrRad 3+ 3+  Knee 3+ 3+  Ankle 2+ 2+   Pathological Reflexes: Hoffman: absent bilaterally Troemner: absent bilaterally Sensation intact to light touch in all extremities. Gait: Unable to rise from chair with arms crossed unassisted. Waddling gait.   Lab and Test Review: New results: 10/09/22: Myositis panel Component     Latest Ref Rng 10/12/2022  JO-1 AB     <11 SI <11   PL-7 AB     <11 SI <11   PL-12 AB     <11 SI <11   EJ AB     <11 SI <11   OJ AB     <11 SI <11   SRP-AB     <11 SI <11   MI-2 ALPHA AB     <11 SI <11   MI-2 BETA AB     <11 SI <11   MDA-5 AB     <11 SI <11   TIF-1y AB     <11 SI <11   NXP-2 AB     <11 SI <11  Component     Latest Ref Rng 10/09/2022  JO-1 AB     <11 SI <11   PL-7 AB     <11 SI <11   PL-12 AB     <11 SI <11   EJ AB     <11 SI <11   OJ AB     <11 SI <11   SRP-AB     <11 SI <11   MI-2 ALPHA AB     <11 SI <11   MI-2 BETA AB     <11 SI <11   MDA-5 AB     <11 SI <11   TIF-1y AB     <11 SI <11   NXP-2 AB     <11 SI <11   HMGCR AB (IGG)     [id number] CU <2    CYTOSOLIC 5' NUCLEOTIDASE 1A (cN 1A) AB (IGG)     Units 6    CK: 62 Aldolase: 4.2  EMG (10/12/22): NCV & EMG Findings: Extensive electrodiagnostic evaluation of the right upper and lower limbs show: Right sural and right median sensory responses are within normal limits. Right peroneal/fibular (EDB) and right median (ABP) motor responses are within normal limits. Chronic motor axon loss changes without accompanying active denervation changes are seen in the right first dorsal interosseous, right extensor indicis proprius, and right flexor digitorum longus muscles.   Impression: This is an abnormal electrodiagnostic evaluation. The findings are most consistent with the following: No electrodiagnostic evidence of myopathy. The residuals of an old intraspinal canal lesion (ie: motor radiculopathy) at the right C8 root, mild in degree electrically. No electrodiagnostic evidence of a right lumbosacral (L2-S1) motor radiculopathy. No electrodiagnostic evidence of a right median mononeuropathy at or distal to the wrist, ie carpal tunnel syndrome.  Previously reviewed results: Per Dr. Joya Gaskins note from 09/08/22: HbA1c: 5.6% TSH: 5.19  ASSESSMENT: This is Marge Duncans, a 82 y.o. female with distal upper extremity weakness and proximal lower extremity weakness. EMG showed no evidence of myopathy and labs, including CK and myopathy panel, were negative. 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 The cause of patient's lower extremity proximal weakness is currently unclear. She has some hyperreflexia in all extremities, so cervical stenosis is possible, but this would not well explain proximal weakness. She seems to be improving with PT, so we will continue that for now and monitor her symptoms. She will call with new or worsening symptoms.   Plan: -Continue PT -Discussed MRI cervical spine as a possible next step, but  patient preferred to defer for now -Okay to continue Zetia. Recommended she hold off on statin for now.  Return to clinic in 6 months  Total time spent reviewing records, interview, history/exam, documentation, and coordination of care on day of encounter:  25 min  Kai Levins, MD

## 2022-11-04 DIAGNOSIS — M6281 Muscle weakness (generalized): Secondary | ICD-10-CM | POA: Diagnosis not present

## 2022-11-04 DIAGNOSIS — R2689 Other abnormalities of gait and mobility: Secondary | ICD-10-CM | POA: Diagnosis not present

## 2022-11-05 ENCOUNTER — Encounter: Payer: Self-pay | Admitting: Neurology

## 2022-11-05 ENCOUNTER — Ambulatory Visit: Payer: Medicare PPO | Admitting: Neurology

## 2022-11-05 VITALS — BP 168/80 | HR 71 | Ht 64.0 in | Wt 168.0 lb

## 2022-11-05 DIAGNOSIS — W19XXXS Unspecified fall, sequela: Secondary | ICD-10-CM | POA: Diagnosis not present

## 2022-11-05 DIAGNOSIS — M5412 Radiculopathy, cervical region: Secondary | ICD-10-CM

## 2022-11-05 DIAGNOSIS — R29898 Other symptoms and signs involving the musculoskeletal system: Secondary | ICD-10-CM

## 2022-11-05 NOTE — Patient Instructions (Signed)
Continue physical therapy.  Okay to continue Zetia, but I would hold off on any statin for now.  I would like to see you back in 6 months or sooner if you have new or worsening symptoms. You should call us.  The physicians and staff at [facility] are committed to providing excellent care. You may receive a survey requesting feedback about your experience at our office. We strive to receive "very good" responses to the survey questions. If you feel that your experience would prevent you from giving the office a "very good " response, please contact our office to try to remedy the situation. We may be reached at [phone]. Thank you for taking the time out of your busy day to complete the survey.  Kai Levins, MD [facility]  Preventing Falls at [facility] are common, often dreaded events in the lives of older people. Aside from the obvious injuries and even death that may result, fall can cause wide-ranging consequences including loss of independence, mental decline, decreased activity and mobility. Younger people are also at risk of falling, especially those with chronic illnesses and fatigue.  Ways to reduce risk for falling Examine diet and medications. Warm foods and alcohol dilate blood vessels, which can lead to dizziness when standing. Sleep aids, antidepressants and pain medications can also increase the likelihood of a fall.  Get a vision exam. Poor vision, cataracts and glaucoma increase the chances of falling.  Check foot gear. Shoes should fit snugly and have a sturdy, nonskid sole and a broad, low heel  Participate in a physician-approved exercise program to build and maintain muscle strength and improve balance and coordination. Programs that use ankle weights or stretch bands are excellent for muscle-strengthening. Water aerobics programs and low-impact Tai Chi programs have also been shown to improve balance and coordination.  Increase vitamin D intake. Vitamin  D improves muscle strength and increases the amount of calcium the body is able to absorb and deposit in bones.  How to prevent falls from common hazards Floors - Remove all loose wires, cords, and throw rugs. Minimize clutter. Make sure rugs are anchored and smooth. Keep furniture in its usual place.  Chairs -- Use chairs with straight backs, armrests and firm seats. Add firm cushions to existing pieces to add height.  Bathroom - Install grab bars and non-skid tape in the tub or shower. Use a bathtub transfer bench or a shower chair with a back support Use an elevated toilet seat and/or safety rails to assist standing from a low surface. Do not use towel racks or bathroom tissue holders to help you stand.  Lighting - Make sure halls, stairways, and entrances are well-lit. Install a night light in your bathroom or hallway. Make sure there is a light switch at the top and bottom of the staircase. Turn lights on if you get up in the middle of the night. Make sure lamps or light switches are within reach of the bed if you have to get up during the night.  Kitchen - Install non-skid rubber mats near the sink and stove. Clean spills immediately. Store frequently used utensils, pots, pans between waist and eye level. This helps prevent reaching and bending. Sit when getting things out of lower cupboards.  Living room/ Bedrooms - Place furniture with wide spaces in between, giving enough room to move around. Establish a route through the living room that gives you something to hold onto as you walk.  Stairs - Make sure treads,  rails, and rugs are secure. Install a rail on both sides of the stairs. If stairs are a threat, it might be helpful to arrange most of your activities on the lower level to reduce the number of times you must climb the stairs.  Entrances and doorways - Install metal handles on the walls adjacent to the doorknobs of all doors to make it more secure as you travel through the  doorway.  Tips for maintaining balance Keep at least one hand free at all times. Try using a backpack or fanny pack to hold things rather than carrying them in your hands. Never carry objects in both hands when walking as this interferes with keeping your balance.  Attempt to swing both arms from front to back while walking. This might require a conscious effort if Parkinson's disease has diminished your movement. It will, however, help you to maintain balance and posture, and reduce fatigue.  Consciously lift your feet off of the ground when walking. Shuffling and dragging of the feet is a common culprit in losing your balance.  When trying to navigate turns, use a "U" technique of facing forward and making a wide turn, rather than pivoting sharply.  Try to stand with your feet shoulder-length apart. When your feet are close together for any length of time, you increase your risk of losing your balance and falling.  Do one thing at a time. Don't try to walk and accomplish another task, such as reading or looking around. The decrease in your automatic reflexes complicates motor function, so the less distraction, the better.  Do not wear rubber or gripping soled shoes, they might "catch" on the floor and cause tripping.  Move slowly when changing positions. Use deliberate, concentrated movements and, if needed, use a grab bar or walking aid. Count 15 seconds between each movement. For example, when rising from a seated position, wait 15 seconds after standing to begin walking.  If balance is a continuous problem, you might want to consider a walking aid such as a cane, walking stick, or walker. Once you've mastered walking with help, you might be ready to try it on your own again.

## 2022-11-16 DIAGNOSIS — M6281 Muscle weakness (generalized): Secondary | ICD-10-CM | POA: Diagnosis not present

## 2022-11-16 DIAGNOSIS — R2689 Other abnormalities of gait and mobility: Secondary | ICD-10-CM | POA: Diagnosis not present

## 2022-11-17 DIAGNOSIS — K219 Gastro-esophageal reflux disease without esophagitis: Secondary | ICD-10-CM | POA: Diagnosis not present

## 2022-11-17 DIAGNOSIS — I10 Essential (primary) hypertension: Secondary | ICD-10-CM | POA: Diagnosis not present

## 2022-11-17 DIAGNOSIS — K222 Esophageal obstruction: Secondary | ICD-10-CM | POA: Diagnosis not present

## 2022-11-17 DIAGNOSIS — R1310 Dysphagia, unspecified: Secondary | ICD-10-CM | POA: Diagnosis not present

## 2022-11-17 DIAGNOSIS — K449 Diaphragmatic hernia without obstruction or gangrene: Secondary | ICD-10-CM | POA: Diagnosis not present

## 2022-11-24 DIAGNOSIS — R2689 Other abnormalities of gait and mobility: Secondary | ICD-10-CM | POA: Diagnosis not present

## 2022-11-24 DIAGNOSIS — M6281 Muscle weakness (generalized): Secondary | ICD-10-CM | POA: Diagnosis not present

## 2022-11-26 ENCOUNTER — Other Ambulatory Visit: Payer: Self-pay | Admitting: Cardiology

## 2022-11-27 DIAGNOSIS — M6281 Muscle weakness (generalized): Secondary | ICD-10-CM | POA: Diagnosis not present

## 2022-11-27 DIAGNOSIS — R2689 Other abnormalities of gait and mobility: Secondary | ICD-10-CM | POA: Diagnosis not present

## 2022-12-09 DIAGNOSIS — R2689 Other abnormalities of gait and mobility: Secondary | ICD-10-CM | POA: Diagnosis not present

## 2022-12-09 DIAGNOSIS — M6281 Muscle weakness (generalized): Secondary | ICD-10-CM | POA: Diagnosis not present

## 2022-12-14 DIAGNOSIS — J9811 Atelectasis: Secondary | ICD-10-CM | POA: Diagnosis not present

## 2022-12-14 DIAGNOSIS — R053 Chronic cough: Secondary | ICD-10-CM | POA: Diagnosis not present

## 2022-12-14 DIAGNOSIS — M545 Low back pain, unspecified: Secondary | ICD-10-CM | POA: Diagnosis not present

## 2022-12-14 DIAGNOSIS — M47816 Spondylosis without myelopathy or radiculopathy, lumbar region: Secondary | ICD-10-CM | POA: Diagnosis not present

## 2022-12-14 DIAGNOSIS — R059 Cough, unspecified: Secondary | ICD-10-CM | POA: Diagnosis not present

## 2022-12-16 DIAGNOSIS — R2689 Other abnormalities of gait and mobility: Secondary | ICD-10-CM | POA: Diagnosis not present

## 2022-12-16 DIAGNOSIS — M6281 Muscle weakness (generalized): Secondary | ICD-10-CM | POA: Diagnosis not present

## 2022-12-29 DIAGNOSIS — T7840XA Allergy, unspecified, initial encounter: Secondary | ICD-10-CM | POA: Diagnosis not present

## 2022-12-29 DIAGNOSIS — R21 Rash and other nonspecific skin eruption: Secondary | ICD-10-CM | POA: Diagnosis not present

## 2022-12-30 DIAGNOSIS — R2689 Other abnormalities of gait and mobility: Secondary | ICD-10-CM | POA: Diagnosis not present

## 2022-12-30 DIAGNOSIS — M6281 Muscle weakness (generalized): Secondary | ICD-10-CM | POA: Diagnosis not present

## 2022-12-31 DIAGNOSIS — H903 Sensorineural hearing loss, bilateral: Secondary | ICD-10-CM | POA: Diagnosis not present

## 2023-01-04 DIAGNOSIS — L820 Inflamed seborrheic keratosis: Secondary | ICD-10-CM | POA: Diagnosis not present

## 2023-01-06 DIAGNOSIS — K219 Gastro-esophageal reflux disease without esophagitis: Secondary | ICD-10-CM | POA: Diagnosis not present

## 2023-01-06 DIAGNOSIS — R1310 Dysphagia, unspecified: Secondary | ICD-10-CM | POA: Diagnosis not present

## 2023-01-06 DIAGNOSIS — Z9889 Other specified postprocedural states: Secondary | ICD-10-CM | POA: Diagnosis not present

## 2023-01-07 ENCOUNTER — Encounter: Payer: Self-pay | Admitting: Cardiology

## 2023-01-07 ENCOUNTER — Other Ambulatory Visit: Payer: Self-pay

## 2023-01-07 ENCOUNTER — Telehealth: Payer: Self-pay | Admitting: Cardiology

## 2023-01-07 ENCOUNTER — Ambulatory Visit: Payer: Medicare PPO | Attending: Cardiology

## 2023-01-07 ENCOUNTER — Ambulatory Visit: Payer: Medicare PPO | Attending: Cardiology | Admitting: Cardiology

## 2023-01-07 VITALS — BP 158/107 | HR 85 | Ht 64.0 in | Wt 166.0 lb

## 2023-01-07 DIAGNOSIS — I201 Angina pectoris with documented spasm: Secondary | ICD-10-CM | POA: Diagnosis not present

## 2023-01-07 DIAGNOSIS — E782 Mixed hyperlipidemia: Secondary | ICD-10-CM | POA: Diagnosis not present

## 2023-01-07 DIAGNOSIS — I10 Essential (primary) hypertension: Secondary | ICD-10-CM

## 2023-01-07 DIAGNOSIS — I4719 Other supraventricular tachycardia: Secondary | ICD-10-CM

## 2023-01-07 MED ORDER — CLONIDINE HCL 0.1 MG PO TABS: 0.1000 mg | ORAL_TABLET | ORAL | 1 refills | Status: AC | PRN

## 2023-01-07 MED ORDER — VERAPAMIL HCL ER 180 MG PO TBCR: 180.0000 mg | EXTENDED_RELEASE_TABLET | Freq: Three times a day (TID) | ORAL | 3 refills | Status: DC

## 2023-01-07 NOTE — Telephone Encounter (Signed)
Patient came by the office an she was added to Victorino Dike, the nurse practitioners schedule and seen by [facility]. All of the patient's questions were answered and she had no further questions at this time.

## 2023-01-07 NOTE — Telephone Encounter (Signed)
Pt c/o BP issue: STAT if pt c/o blurred vision, one-sided weakness or slurred speech  1. What are your last 5 BP readings?  The past week  151/86 - today  154/82 151/86 133/72 155/78 152/80 142/62 105/54   2. Are you having any other symptoms (ex. Dizziness, headache, blurred vision, passed out)? Lightheaded   3. What is your BP issue? Pt states her bp has been high and she is very concerned.

## 2023-01-07 NOTE — Progress Notes (Signed)
   Nurse Visit   Date of Encounter: 01/07/2023 ID: Melissa Lozano, DOB 1940-11-29, MRN [id number]  PCP:  Paulina Fusi, MD   [facility] Providers Cardiologist:  None      Visit Details   VS:  BP 132/76 (BP Location: Left Arm, Patient Position: Sitting, Cuff Size: Normal)   Pulse 64   Wt 166 lb 3.2 oz (75.4 kg)   BMI 28.53 kg/m  , BMI Body mass index is 28.53 kg/m.  Wt Readings from Last 3 Encounters:  01/07/23 166 lb 3.2 oz (75.4 kg)  11/05/22 168 lb (76.2 kg)  09/25/22 168 lb (76.2 kg)     Reason for visit: Perform Vitals Performed today: Vitals, Provider consulted, Education Changes (medications, testing, etc.) : Patient instructed to purchase a new [employer] blood pressure monitor and to check blood pressures daily and send a list to the office in two weeks. Length of Visit: 25 minutes    Medications Adjustments/Labs and Tests Ordered: No orders of the defined types were placed in this encounter.  No orders of the defined types were placed in this encounter.    Signed, Samson Frederic, RN  01/07/2023 10:18 AM

## 2023-01-07 NOTE — Telephone Encounter (Signed)
Patient called back to say that she brought a new bp monitor. Her bp is 178/90. Patient is worried. Please advise

## 2023-01-07 NOTE — Progress Notes (Signed)
Cardiology Office Note:    Date:  01/07/2023   ID:  Melissa Lozano, DOB 11-17-1940, MRN [id number]  PCP:  Paulina Fusi, MD   [facility] Providers Cardiologist:  Norman Herrlich, MD     Referring MD: Paulina Fusi, MD  CC: elevated BP    History of Present Illness:    Melissa Lozano is a 82 y.o. female with a hx of hypothyroidism, STEMI, hypertension, paroxysmal atrial tachycardia, hyperlipidemia.  She establish care with Dr. Dulce Sellar in 2016 for management of hypertension, paroxysmal atrial tachycardia.  She was evaluated by Dr. Dulce Sellar on 09/08/2022, at that time she was doing well from a cardiac perspective.  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  She was referred to a neurologist.  Most recently she developed bilateral leg weakness, was evaluated by neurology, her Zetia and atorvastatin were stopped until symptoms determination could be made.  She presents today for evaluation of her blood pressure.  She had been checking her blood pressure at home with a wrist cuff and was concerned that was not accurate.  She came in for an office visit, blood pressure was well-controlled at 134/76, she was encouraged to purchase an upper arm cuff which she did and checked her blood pressure multiple times revealing elevated blood pressure readings.  She returned to the office on the same day very concerned about her blood pressure.  We correlated her new blood pressure cuff with manual BP reading, blood pressure was elevated at 163/105.  She was previously on clonidine as needed for systolic readings greater than 100 and requested that this be refilled. She denies chest pain, palpitations, dyspnea, pnd, orthopnea, n, v, dizziness, syncope, edema, weight gain, or early satiety.   Past Medical History:  Diagnosis Date   Angina pectoris 08/16/2015   Overview:  stable, no recurrence of her present meds and continue same with a CCB,  aspirin and a statin. Stress echo negative for ischemia at 10 mets 08/23/13   C. 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 Rennis Chris    Current Medications: Current Meds  Medication Sig   aspirin EC 81 MG tablet Take 81 mg by mouth daily.   calcium-vitamin D (OSCAL WITH D) 500-200 MG-UNIT TABS tablet Take 1 tablet by mouth 2 (two) times daily.   cloNIDine (CATAPRES) 0.1 MG tablet Take 1 tablet (0.1 mg total) by mouth as needed (for DBP greater than 100).   denosumab (PROLIA) 60 MG/ML SOSY injection Inject 60 mg into the skin every 6 (six) months.   diclofenac Sodium (  VOLTAREN) 1 % GEL Apply 2 g topically as needed (pain).   ezetimibe (ZETIA) 10 MG tablet Take 10 mg by mouth daily.   famotidine (PEPCID) 40 MG tablet Take 40 mg by mouth daily.   levothyroxine (SYNTHROID) 50 MCG tablet Take 50 mcg by mouth daily.   Multiple Vitamins-Minerals (MULTIPLE VITAMINS/WOMENS PO) Take 1 tablet by mouth daily.   MYRBETRIQ 50 MG TB24 tablet Take 50 mg by mouth daily.   nitroGLYCERIN (NITROSTAT) 0.4 MG SL tablet Place 1 tablet (0.4 mg total) under the tongue every 5 (five) minutes as needed for chest pain.   Omega-3  Fatty Acids (FISH OIL) 1000 MG CAPS Take 1 capsule by mouth daily.   pantoprazole (PROTONIX) 40 MG tablet Take 40 mg by mouth daily.   polyethylene glycol powder (GLYCOLAX/MIRALAX) powder MIX 17GMS (1 CAPFUL) IN 8OZ OF LIQUID AND DRINK ONCE DAILY   psyllium (METAMUCIL) 58.6 % packet Take 1 packet by mouth daily.   spironolactone-hydrochlorothiazide (ALDACTAZIDE) 25-25 MG per tablet Take 1 tablet by mouth 3 (three) times a week.    verapamil (CALAN-SR) 180 MG CR tablet Take 1 tablet (180 mg total) by mouth in the morning, at noon, and at bedtime.   Vitamins-Lipotropics (LIPOFLAVOVIT PO) Take 1 tablet by mouth 2 (two) times daily.   [DISCONTINUED] verapamil (CALAN-SR) 180 MG CR tablet Take 1 tablet (180 mg total) by mouth 2 (two) times daily.   Current Facility-Administered Medications for the 01/07/23 encounter (Office Visit) with Flossie Dibble, NP  Medication   dexamethasone (DECADRON) injection 4 mg     Allergies:   Sulfa antibiotics, Codeine, Tape, and Wound dressing adhesive   Social History   Socioeconomic History   Marital status: Widowed    Spouse name: Not on file   Number of children: Not on file   Years of education: Not on file   Highest education level: Not on file  Occupational History   Not on file  Tobacco Use   Smoking status: Never   Smokeless tobacco: Never  Vaping Use   Vaping Use: Never used  Substance and Sexual Activity   Alcohol use: Yes    Alcohol/week: 0.0 standard drinks of alcohol    Comment: occasional   Drug use: No   Sexual activity: Not on file  Other Topics Concern   Not on file  Social History Narrative   Are you right handed or left handed? Right   Are you currently employed ?    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 Retirement home   Who lives with you?    What type of home do you live in: 1 story or 2 story? one       Social Determinants of Corporate investment banker Strain: Not on file  Food  Insecurity: Not on file  Transportation Needs: Not on file  Physical Activity: Not on file  Stress: Not on file  Social Connections: Not on file     Family History: The patient's family history includes Aneurysm in her father; Diabetes in her maternal grandmother.  ROS:   Please see the history of present illness.    All other systems reviewed and are negative.  EKGs/Labs/Other Studies Reviewed:    The following studies were reviewed today:      EKG:  EKG is  ordered today.  The ekg ordered today demonstrates normal sinus rhythm, heart rate 85 bpm.  Recent Labs: No results found for requested labs  within last 365 days.  Recent Lipid Panel    Risk Assessment/Calculations:      HYPERTENSION CONTROL Vitals:   01/07/23 1538 01/07/23 1539 01/07/23 1540  BP: (!) 186/92 (!) 163/105 (!) 158/107    The patient's blood pressure is elevated above target today.  In order to address the patient's elevated BP: A current anti-hypertensive medication was adjusted today.            Physical Exam:    VS:  BP (!) 158/107 (BP Location: Left Arm, Patient Position: Sitting) Comment: Patients home monitor  Pulse 85   Ht  (1.626 m)   Wt 166 lb (75.3 kg)   SpO2 96%   BMI 28.49 kg/m     Wt Readings from Last 3 Encounters:  01/07/23 166 lb (75.3 kg)  01/07/23 166 lb 3.2 oz (75.4 kg)  11/05/22 168 lb (76.2 kg)     GEN:  Well nourished, well developed in no acute distress HEENT: Normal NECK: No JVD; No carotid bruits LYMPHATICS: No lymphadenopathy CARDIAC: RRR, no murmurs, rubs, gallops RESPIRATORY:  Clear to auscultation without rales, wheezing or rhonchi  ABDOMEN: Soft, non-tender, non-distended MUSCULOSKELETAL:  No edema; No deformity  SKIN: Warm and dry NEUROLOGIC:  Alert and oriented x 3 PSYCHIATRIC:  Normal affect   ASSESSMENT:    1. Essential hypertension   2. PAT (paroxysmal atrial tachycardia)   3. Coronary artery spasm   4. Mixed hyperlipidemia     PLAN:    In order of problems listed above:  Hypertension-blood pressure elevated in the office today.  Will refill her clonidine 0.1 mg to take if her diastolic is greater than 100.  Will increase her verapamil to 180 mg TID. Keep BP log, and report readings that are persistently higher than 150 systolic. Continue aldactazide 25-25 mg three times/week. Will refill clonidine 0.1 mg to take PRN for diastolic BP > 100.   Paroxysmal atrial tachycardia-EKG in office today reveals normal sinus rhythm, denies palpitations.  Continue verapamil 180 mg 3 times daily. Hyperlipidemia-she is currently dealing with myopathies, being evaluated by neurology for lower leg weakness her statin and Zetia have been discontinued for now.  Will continue to monitor.   Disposition-refill clonidine as needed for diastolic blood pressure greater than 100, increase verapamil per above. Return in 2 months.        Medication Adjustments/Labs and Tests Ordered: Current medicines are reviewed at length with the patient today.  Concerns regarding medicines are outlined above.  Orders Placed This Encounter  Procedures   EKG 12-Lead   Meds ordered this encounter  Medications   cloNIDine (CATAPRES) 0.1 MG tablet    Sig: Take 1 tablet (0.1 mg total) by mouth as needed (for DBP greater than 100).    Dispense:  90 tablet    Refill:  1   verapamil (CALAN-SR) 180 MG CR tablet    Sig: Take 1 tablet (180 mg total) by mouth in the morning, at noon, and at bedtime.    Dispense:  270 tablet    Refill:  3    There are no Patient Instructions on file for this visit.   Signed, Flossie Dibble, NP  01/07/2023 7:28 PM    [facility]

## 2023-01-11 ENCOUNTER — Ambulatory Visit: Payer: Medicare PPO | Admitting: Cardiology

## 2023-01-14 DIAGNOSIS — M810 Age-related osteoporosis without current pathological fracture: Secondary | ICD-10-CM | POA: Diagnosis not present

## 2023-01-25 DIAGNOSIS — Z7982 Long term (current) use of aspirin: Secondary | ICD-10-CM | POA: Diagnosis not present

## 2023-01-25 DIAGNOSIS — Z8616 Personal history of COVID-19: Secondary | ICD-10-CM | POA: Diagnosis not present

## 2023-01-25 DIAGNOSIS — R0602 Shortness of breath: Secondary | ICD-10-CM | POA: Diagnosis not present

## 2023-01-25 DIAGNOSIS — R0609 Other forms of dyspnea: Secondary | ICD-10-CM | POA: Diagnosis not present

## 2023-01-25 DIAGNOSIS — Z79899 Other long term (current) drug therapy: Secondary | ICD-10-CM | POA: Diagnosis not present

## 2023-01-25 DIAGNOSIS — R0600 Dyspnea, unspecified: Secondary | ICD-10-CM | POA: Diagnosis not present

## 2023-01-25 DIAGNOSIS — I272 Pulmonary hypertension, unspecified: Secondary | ICD-10-CM | POA: Diagnosis not present

## 2023-01-25 DIAGNOSIS — I10 Essential (primary) hypertension: Secondary | ICD-10-CM | POA: Diagnosis not present

## 2023-01-25 DIAGNOSIS — I361 Nonrheumatic tricuspid (valve) insufficiency: Secondary | ICD-10-CM | POA: Diagnosis not present

## 2023-01-25 DIAGNOSIS — I252 Old myocardial infarction: Secondary | ICD-10-CM | POA: Diagnosis not present

## 2023-01-25 DIAGNOSIS — I4891 Unspecified atrial fibrillation: Secondary | ICD-10-CM | POA: Diagnosis not present

## 2023-01-25 DIAGNOSIS — E039 Hypothyroidism, unspecified: Secondary | ICD-10-CM | POA: Diagnosis not present

## 2023-01-26 DIAGNOSIS — I361 Nonrheumatic tricuspid (valve) insufficiency: Secondary | ICD-10-CM | POA: Diagnosis not present

## 2023-01-26 DIAGNOSIS — R0609 Other forms of dyspnea: Secondary | ICD-10-CM | POA: Diagnosis not present

## 2023-01-26 DIAGNOSIS — I252 Old myocardial infarction: Secondary | ICD-10-CM | POA: Diagnosis not present

## 2023-01-26 DIAGNOSIS — I272 Pulmonary hypertension, unspecified: Secondary | ICD-10-CM

## 2023-01-26 DIAGNOSIS — I10 Essential (primary) hypertension: Secondary | ICD-10-CM

## 2023-01-26 DIAGNOSIS — Z8616 Personal history of COVID-19: Secondary | ICD-10-CM | POA: Diagnosis not present

## 2023-01-27 DIAGNOSIS — M810 Age-related osteoporosis without current pathological fracture: Secondary | ICD-10-CM | POA: Diagnosis not present

## 2023-02-17 DIAGNOSIS — Z85828 Personal history of other malignant neoplasm of skin: Secondary | ICD-10-CM | POA: Diagnosis not present

## 2023-02-17 DIAGNOSIS — Z129 Encounter for screening for malignant neoplasm, site unspecified: Secondary | ICD-10-CM | POA: Diagnosis not present

## 2023-02-17 DIAGNOSIS — L720 Epidermal cyst: Secondary | ICD-10-CM | POA: Diagnosis not present

## 2023-02-17 DIAGNOSIS — D3610 Benign neoplasm of peripheral nerves and autonomic nervous system, unspecified: Secondary | ICD-10-CM | POA: Diagnosis not present

## 2023-02-17 DIAGNOSIS — Z808 Family history of malignant neoplasm of other organs or systems: Secondary | ICD-10-CM | POA: Diagnosis not present

## 2023-02-17 DIAGNOSIS — L821 Other seborrheic keratosis: Secondary | ICD-10-CM | POA: Diagnosis not present

## 2023-02-17 DIAGNOSIS — I878 Other specified disorders of veins: Secondary | ICD-10-CM | POA: Diagnosis not present

## 2023-02-22 DIAGNOSIS — I10 Essential (primary) hypertension: Secondary | ICD-10-CM | POA: Diagnosis not present

## 2023-02-24 ENCOUNTER — Ambulatory Visit: Payer: Medicare PPO | Admitting: Allergy and Immunology

## 2023-02-24 ENCOUNTER — Encounter: Payer: Self-pay | Admitting: Allergy and Immunology

## 2023-02-24 VITALS — BP 144/82 | HR 74 | Resp 10 | Ht 62.0 in | Wt 168.0 lb

## 2023-02-24 DIAGNOSIS — T50905D Adverse effect of unspecified drugs, medicaments and biological substances, subsequent encounter: Secondary | ICD-10-CM

## 2023-02-24 DIAGNOSIS — Z888 Allergy status to other drugs, medicaments and biological substances status: Secondary | ICD-10-CM

## 2023-02-24 DIAGNOSIS — H04123 Dry eye syndrome of bilateral lacrimal glands: Secondary | ICD-10-CM

## 2023-02-24 DIAGNOSIS — K219 Gastro-esophageal reflux disease without esophagitis: Secondary | ICD-10-CM | POA: Diagnosis not present

## 2023-02-24 NOTE — Progress Notes (Signed)
[place] - [place] - [place] - [place]   Dear Tomasa Blase,  Thank you for referring Melissa Lozano to the [facility] [place] on 02/24/2023.   Below is a summation of this patient's evaluation and recommendations.  Thank you for your referral. I will keep you informed about this patient's response to treatment.   If you have any questions please do not hesitate to contact me.   Sincerely,  Jessica Priest, MD Allergy / Immunology [facility]   ______________________________________________________________________    NEW PATIENT NOTE  Referring Provider: Paulina Fusi, MD Primary Provider: Paulina Fusi, MD Date of office visit: 02/24/2023    Subjective:   Chief Complaint:  Melissa Lozano (DOB: 1940/12/04) is a 82 y.o. female who presents to the clinic on 02/24/2023 with a chief complaint of Medication Reaction .     HPI: Merian presents to the clinic in evaluation of possible reaction to prednisone.  She states that earlier this year she contracted COVID which was mostly a respiratory tract issue and she was given systemic steroids.  Within 48 hours of starting systemic steroids her face got extremely flushed and red and she continued this course of systemic steroids until completion and within 2 days of discontinuing her prednisone and taking Benadryl her redness decreased significantly.  However, she then scaled and peeled the skin on her face.  She had no other associated systemic or constitutional symptoms and no other part of her body was affected.  She also appears to have issues with reflux that is still very active while using omeprazole and famotidine.  She has seen Dr. Jennye Boroughs and has had an upper endoscopy.  She uses fish oil and verapamil.  She drinks 1 coffee per day and has chocolate intermittently and has alcohol intermittently.  She also notes that she  has dry eye and dry mouth.  She is just started some biotin mouthwash.  She may be using oxybutynin although she is not entirely clear that this is the case as this medication is not noted on her handcarried medication list printed in December 2023.  Past Medical History:  Diagnosis Date  . Angina pectoris (HCC) 08/16/2015   Overview:  stable, no recurrence of her present meds and continue same with a CCB, aspirin and a statin. Stress echo negative for ischemia at 10 mets 08/23/13  . C. difficile colitis 06/10/2017  . Cataract    Mixed OU  . Coronary artery spasm (HCC) 08/16/2015  . Essential hypertension 08/16/2015  . Hyperlipidemia 08/16/2015  . Hypertension   . Hypertensive retinopathy    OU  . Migraine 06/10/2017  . 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 Osteoporosis   . PAT (paroxysmal atrial tachycardia) 08/16/2015  . Retinal detachment    OS    Past Surgical History:  Procedure Laterality Date  . ABDOMINAL HYSTERECTOMY    . BUNIONECTOMY     reverse bunionectomy  . CARDIAC CATHETERIZATION    . CHOLECYSTECTOMY    . EYE SURGERY Left 12/24/2020   Pneumatic retinopexy for retinoschisis RD repair - Dr. Rennis Chris  . HAMMER TOE SURGERY    . 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 Rennis Chris    Allergies as of 02/24/2023       Reactions  Sulfa Antibiotics Nausea And Vomiting, Nausea Only   Other reaction(s): Vomiting Other Reaction(s): GI Intolerance   Codeine Itching   Levaquin [levofloxacin] Other (See Comments)   Does not remember reaction.   Tape Itching   Wound Dressing Adhesive Itching        Medication List    aspirin EC 81 MG tablet Take 81 mg by mouth daily.   calcium-vitamin D 500-200 MG-UNIT Tabs tablet Commonly known as: OSCAL WITH D Take 1 tablet by mouth 2 (two) times daily.   cloNIDine 0.1 MG  tablet Commonly known as: CATAPRES Take 1 tablet (0.1 mg total) by mouth as needed (for DBP greater than 100).   denosumab 60 MG/ML Sosy injection Commonly known as: PROLIA Inject 60 mg into the skin every 6 (six) months.   diclofenac Sodium 1 % Gel Commonly known as: VOLTAREN Apply 2 g topically as needed (pain).   ezetimibe 10 MG tablet Commonly known as: ZETIA Take 10 mg by mouth daily.   famotidine 40 MG tablet Commonly known as: PEPCID Take 40 mg by mouth daily.   Fish Oil 1000 MG Caps Take 1 capsule by mouth daily.   levothyroxine 50 MCG tablet Commonly known as: SYNTHROID Take 50 mcg by mouth daily.   LIPOFLAVOVIT PO Take 1 tablet by mouth 2 (two) times daily.   meloxicam 15 MG tablet Commonly known as: MOBIC Take 15 mg by mouth daily.   MULTIPLE VITAMINS/WOMENS PO Take 1 tablet by mouth daily.   Myrbetriq 50 MG Tb24 tablet Generic drug: mirabegron ER Take 50 mg by mouth daily.   nitroGLYCERIN 0.4 MG SL tablet Commonly known as: NITROSTAT Place 1 tablet (0.4 mg total) under the tongue every 5 (five) minutes as needed for chest pain.   oxybutynin 10 MG 24 hr tablet Commonly known as: DITROPAN-XL Take 10 mg by mouth daily.   pantoprazole 40 MG tablet Commonly known as: PROTONIX Take 40 mg by mouth daily.   polyethylene glycol powder 17 GM/SCOOP powder Commonly known as: GLYCOLAX/MIRALAX MIX 17GMS (1 CAPFUL) IN 8OZ OF LIQUID AND DRINK ONCE DAILY   psyllium 58.6 % packet Commonly known as: METAMUCIL Take 1 packet by mouth daily.   spironolactone-hydrochlorothiazide 25-25 MG tablet Commonly known as: ALDACTAZIDE Take 1 tablet by mouth 3 (three) times a week.   verapamil 180 MG CR tablet Commonly known as: CALAN-SR Take 1 tablet (180 mg total) by mouth in the morning, at noon, and at bedtime.   VITAMIN D3 PO Take by mouth daily.    Review of systems negative except as noted in HPI / PMHx or noted below:  Review of Systems  Constitutional:  Negative.   HENT: Negative.    Eyes: Negative.   Respiratory: Negative.    Cardiovascular: Negative.   Gastrointestinal: Negative.   Genitourinary: Negative.   Musculoskeletal: Negative.   Skin: Negative.   Neurological: Negative.   Endo/Heme/Allergies: Negative.   Psychiatric/Behavioral: Negative.      Family History  Problem Relation Age of Onset  . Aneurysm Father   . Melanoma Brother   . Diabetes Maternal Grandmother     Social History   Socioeconomic History  . Marital status: Widowed    Spouse name: Not on file  . Number of children: Not on file  . Years of education: Not on file  . Highest education level: Not on file  Occupational History  . Not on file  Tobacco Use  . Smoking status: Never    Passive exposure: Past  .  Smokeless tobacco: Never  Vaping Use  . Vaping Use: Never used  Substance and Sexual Activity  . Alcohol use: Yes    Alcohol/week: 0.0 standard drinks of alcohol    Comment: occasional  . Drug use: No  . Sexual activity: Not on file  Other Topics Concern  . Not on file  Social History Narrative   Are you right handed or left handed? Right   Are you currently employed ?    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 Retirement home   Who lives with you?    What type of home do you live in: 1 story or 2 story? one       Environmental and Social history  Lives at [facility] top, no animals located inside the household, no carpet in the bedroom, no plastic on the bed, no plastic on the pillow. Objective:   Vitals:   02/24/23 0957 02/24/23 1037  BP: 132/88 (!) 144/82  Pulse: 74   Resp: 10   SpO2: 97%    Height: 5\' 2"  (157.5 cm) Weight: 168 lb (76.2 kg)  Physical Exam Constitutional:      Appearance: She is not diaphoretic.  HENT:     Head: Normocephalic.     Right Ear: Tympanic membrane, ear canal and external ear normal.     Left Ear: Tympanic membrane, ear canal and external ear normal.     Nose:  Nose normal. No mucosal edema or rhinorrhea.     Mouth/Throat:     Pharynx: Uvula midline. No oropharyngeal exudate.  Eyes:     Conjunctiva/sclera: Conjunctivae normal.  Neck:     Thyroid: No thyromegaly.     Trachea: Trachea normal. No tracheal tenderness or tracheal deviation.  Cardiovascular:     Rate and Rhythm: Normal rate and regular rhythm.     Heart sounds: Normal heart sounds, S1 normal and S2 normal. No murmur heard. Pulmonary:     Effort: No respiratory distress.     Breath sounds: Normal breath sounds. No stridor. No wheezing or rales.  Lymphadenopathy:     Head:     Right side of head: No tonsillar adenopathy.     Left side of head: No tonsillar adenopathy.     Cervical: No cervical adenopathy.  Skin:    Findings: No erythema or rash.     Nails: There is no clubbing.  Neurological:     Mental Status: She is alert.    Diagnostics: Allergy skin tests were performed.   Spirometry was performed and demonstrated an FEV1 of *** @ *** % of predicted. FEV1/FVC = ***  The patient had an Asthma Control Test with the following results:  .     Assessment and Plan:    No diagnosis found.  Patient Instructions   1. Replace future prednisone with dexamethasone  2. Treat and prevent reflux:   A. Continue pantoprazole + famotidine  B. Stop fish oil  C. Decrease caffeine and chocolate and alcohol consumption  D. Consider replacing verapamil with another blood pressure medication  3. Treat and prevent dry mouth / eye:   A. OTC Systane eye drops  B. OTC Biotene mouthwash  C. Consider Pilocarpine / cevimeline ???  D. Oxybutynin use???  4. Further evaluation and treatment???   Jessica Priest, MD Allergy / Immunology [facility] [place]

## 2023-02-24 NOTE — Patient Instructions (Addendum)
  1. Replace future prednisone with dexamethasone  2. Treat and prevent reflux:   A. Continue pantoprazole + famotidine  B. Stop fish oil  C. Decrease caffeine and chocolate and alcohol consumption  D. Consider replacing verapamil with another blood pressure medication  3. Treat and prevent dry mouth / eye:   A. OTC Systane eye drops  B. OTC Biotene mouthwash  C. Consider Pilocarpine / cevimeline ???  D. Oxybutynin use???  4. Further evaluation and treatment???

## 2023-02-25 ENCOUNTER — Encounter: Payer: Self-pay | Admitting: Allergy and Immunology

## 2023-03-14 DIAGNOSIS — G729 Myopathy, unspecified: Secondary | ICD-10-CM | POA: Diagnosis not present

## 2023-03-14 DIAGNOSIS — H3530 Unspecified macular degeneration: Secondary | ICD-10-CM | POA: Diagnosis not present

## 2023-03-14 DIAGNOSIS — E785 Hyperlipidemia, unspecified: Secondary | ICD-10-CM | POA: Diagnosis not present

## 2023-03-14 DIAGNOSIS — M1990 Unspecified osteoarthritis, unspecified site: Secondary | ICD-10-CM | POA: Diagnosis not present

## 2023-03-14 DIAGNOSIS — Z79620 Long term (current) use of immunosuppressive biologic: Secondary | ICD-10-CM | POA: Diagnosis not present

## 2023-03-14 DIAGNOSIS — Z7982 Long term (current) use of aspirin: Secondary | ICD-10-CM | POA: Diagnosis not present

## 2023-03-14 DIAGNOSIS — H18419 Arcus senilis, unspecified eye: Secondary | ICD-10-CM | POA: Diagnosis not present

## 2023-03-14 DIAGNOSIS — Z974 Presence of external hearing-aid: Secondary | ICD-10-CM | POA: Diagnosis not present

## 2023-03-14 DIAGNOSIS — D84821 Immunodeficiency due to drugs: Secondary | ICD-10-CM | POA: Diagnosis not present

## 2023-03-14 DIAGNOSIS — Z885 Allergy status to narcotic agent status: Secondary | ICD-10-CM | POA: Diagnosis not present

## 2023-03-14 DIAGNOSIS — M810 Age-related osteoporosis without current pathological fracture: Secondary | ICD-10-CM | POA: Diagnosis not present

## 2023-03-14 DIAGNOSIS — E039 Hypothyroidism, unspecified: Secondary | ICD-10-CM | POA: Diagnosis not present

## 2023-03-14 DIAGNOSIS — Z882 Allergy status to sulfonamides status: Secondary | ICD-10-CM | POA: Diagnosis not present

## 2023-03-14 DIAGNOSIS — M545 Low back pain, unspecified: Secondary | ICD-10-CM | POA: Diagnosis not present

## 2023-03-14 DIAGNOSIS — I252 Old myocardial infarction: Secondary | ICD-10-CM | POA: Diagnosis not present

## 2023-03-14 DIAGNOSIS — K219 Gastro-esophageal reflux disease without esophagitis: Secondary | ICD-10-CM | POA: Diagnosis not present

## 2023-03-14 DIAGNOSIS — R269 Unspecified abnormalities of gait and mobility: Secondary | ICD-10-CM | POA: Diagnosis not present

## 2023-03-15 NOTE — Progress Notes (Unsigned)
Cardiology Office Note:    Date:  03/16/2023   ID:  Melissa Lozano, DOB 1941-04-08, MRN [id number]  PCP:  Paulina Fusi, MD  Cardiologist:  Norman Herrlich, MD    Referring MD: Paulina Fusi, MD    ASSESSMENT:    1. SOB (shortness of breath)   2. Essential hypertension   3. Mixed hyperlipidemia   4. PAT (paroxysmal atrial tachycardia)   5. Coronary artery spasm (HCC)    PLAN:    In order of problems listed above:  Resolved not due to heart failure I suspect it was post-COVID symptoms of ongoing she should have evaluation including PFTs with her abnormal CT of the chest Well-controlled continue her current medical regimen she is now taking an ARB and I asked her to continue it and not to stop verapamil Not on a statin due to muscle weakness she has been seen by neurology I would not restart at this time Stable PAT and coronary spasm continue her calcium channel blocker   Next appointment: 6 months asked her to send a list of blood pressures by MyChart in 2 weeks   Medication Adjustments/Labs and Tests Ordered: Current medicines are reviewed at length with the patient today.  Concerns regarding medicines are outlined above.  No orders of the defined types were placed in this encounter.  No orders of the defined types were placed in this encounter.    History of Present Illness:    Melissa Lozano is a 82 y.o. female with a hx of ACS with coronary artery spasm in 2014 hypertension hyperlipidemia and paroxysmal atrial fibrillation last seen 02/06/2022 with muscle weakness and discontinuation of her statin.  She had an echocardiogram at [facility] 01/25/2023 showing the left ventricle to be mild normal size borderline LVH EF 60 to 65% and a pattern of impaired relaxation right ventricle normal size function left and right atrium are normal in size there is no significant valvular abnormality with the pulmonary artery systolic pressure was mildly elevated 41  mmHg.  She was seen in consultation during her hospitalization in May by coverage cardiology.  Her complaint was shortness of breath her EKG was described as sinus rhythm normal chest x-ray was clear CT of the chest showed no findings of pulmonary embolism and serial troponins were normal physical examination showed no neck vein distention or edema clear lungs and a normal cardiology exam hemoglobin was normal at 13.1 potassium 4.1 creatinine 0.7 she is not felt to have heart failure with a cardiology consultation was discharged on antihypertensives including verapamil losartan and spironolactone  CT of the chest showed no finding of pulmonary embolism however there were multiple calcified right-sided lung nodules consistent with old granulomatous disease.  Compliance with diet, lifestyle and medications: Yes  Is a very complex visit.  In some fashion she was given the message that she had congestive heart failure in the hospital and I reviewed with her that she does not She has a history of both coronary artery spasm and SVT she was told at the hospital she should not be taking calcium channel blocker fortunately she continued her's She is checking her blood pressure sporadically and getting erratic readings and we discussed using a validated device in good technique and serial measurements Her symptoms of weakness and shortness of breath more in the context of recent COVID infection She was unaware she had an abnormal CT of the chest showing findings of old granulomatous disease She is very anxious and concerned  and particularly bothered by having a dry mouth and dry membranes Past Medical History:  Diagnosis Date   Angina pectoris (HCC) 08/16/2015   Overview:  stable, no recurrence of her present meds and continue same with a CCB, aspirin and a statin. Stress echo negative for ischemia at 10 mets 08/23/13   C. difficile colitis 06/10/2017   Cataract    Mixed OU   Coronary artery spasm (HCC)  08/16/2015   Essential hypertension 08/16/2015   Hyperlipidemia 08/16/2015   Hypertension    Hypertensive retinopathy    OU   Migraine 06/10/2017   Old MI (myocardial infarction) 06/10/2017   STEMI September 2008, normal coronary arteriography, 0 CTCa Score     Osteoporosis    PAT (paroxysmal atrial tachycardia) 08/16/2015   Retinal detachment    OS    Past Surgical History:  Procedure Laterality Date   ABDOMINAL HYSTERECTOMY     BUNIONECTOMY     reverse bunionectomy   CARDIAC CATHETERIZATION     CHOLECYSTECTOMY     EYE SURGERY Left 12/24/2020   Pneumatic retinopexy for retinoschisis RD repair - Dr. 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 Rennis Chris    Current Medications: Current Meds  Medication Sig   acetaminophen (TYLENOL 8 HOUR) 650 MG CR tablet Take 650 mg by mouth every 6 (six) hours as needed for pain.   aspirin EC 81 MG tablet Take 81 mg by mouth daily.   calcium-vitamin D (OSCAL WITH D) 500-200 MG-UNIT TABS tablet Take 1 tablet by mouth 2 (two) times daily.   Cholecalciferol (VITAMIN D3 PO) Take 1 capsule by mouth daily.   cloNIDine (CATAPRES) 0.1 MG tablet Take 1 tablet (0.1 mg total) by mouth as needed (for DBP greater than 100).   denosumab (PROLIA) 60 MG/ML SOSY injection Inject 60 mg into the skin every 6 (six) months.   diclofenac Sodium (VOLTAREN) 1 % GEL Apply 2 g topically as needed (pain).   ezetimibe (ZETIA) 10 MG tablet Take 10 mg by mouth daily.   famotidine (PEPCID) 40 MG tablet Take 40 mg by mouth daily.   levothyroxine (SYNTHROID) 50 MCG tablet Take 50 mcg by mouth daily.   losartan (COZAAR) 50 MG tablet Take 50 mg by mouth at bedtime.   meloxicam (MOBIC) 15 MG tablet Take 15 mg by mouth daily.   Multiple Vitamins-Minerals (MULTIPLE VITAMINS/WOMENS PO) Take 1 tablet by mouth daily.   MYRBETRIQ 50 MG TB24 tablet Take 50 mg by mouth daily.    nitroGLYCERIN (NITROSTAT) 0.4 MG SL tablet Place 1 tablet (0.4 mg total) under the tongue every 5 (five) minutes as needed for chest pain.   Omega-3 Fatty Acids (FISH OIL) 1000 MG CAPS Take 1 capsule by mouth daily.   oxybutynin (DITROPAN-XL) 10 MG 24 hr tablet Take 10 mg by mouth daily.   pantoprazole (PROTONIX) 40 MG tablet Take 40 mg by mouth daily.   polyethylene glycol powder (GLYCOLAX/MIRALAX) powder Take 0.5 Containers by mouth daily.   psyllium (METAMUCIL) 58.6 % packet Take 1 packet by mouth daily.   spironolactone (ALDACTONE) 25 MG tablet Take 25 mg by mouth in the morning.   verapamil (CALAN-SR) 180 MG CR tablet Take 1 tablet (180 mg total) by mouth in the morning, at noon, and at bedtime. (Patient taking differently: Take 180 mg by mouth 2 (two) times daily.)   Vitamins-Lipotropics (LIPOFLAVOVIT PO) Take 1  tablet by mouth 2 (two) times daily.   [DISCONTINUED] spironolactone-hydrochlorothiazide (ALDACTAZIDE) 25-25 MG per tablet Take 1 tablet by mouth 3 (three) times a week.      Allergies:   Sulfa antibiotics, Codeine, Levaquin [levofloxacin], Prednisone, Tape, and Wound dressing adhesive   Social History   Socioeconomic History   Marital status: Widowed    Spouse name: Not on file   Number of children: Not on file   Years of education: Not on file   Highest education level: Not on file  Occupational History   Not on file  Tobacco Use   Smoking status: Never    Passive exposure: Past   Smokeless tobacco: Never  Vaping Use   Vaping Use: Never used  Substance and Sexual Activity   Alcohol use: Yes    Alcohol/week: 0.0 standard drinks of alcohol    Comment: occasional   Drug use: No   Sexual activity: Not on file  Other Topics Concern   Not on file  Social History Narrative   Are you right handed or left handed? Right   Are you currently employed ?    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 Retirement home   Who lives with you?    What  type of home do you live in: 1 story or 2 story? one       Social Determinants of Corporate investment banker Strain: Not on file  Food Insecurity: Not on file  Transportation Needs: Not on file  Physical Activity: Not on file  Stress: Not on file  Social Connections: Not on file       EKGs/Labs/Other Studies Reviewed:    The following studies were reviewed today:          Recent Labs: No results found for requested labs within last 365 days.  Recent Lipid Panel    Component Value Date/Time   CHOL 176 01/08/2020 1350   TRIG 71 01/08/2020 1350   HDL 81 01/08/2020 1350   CHOLHDL 2.2 01/08/2020 1350   LDLCALC 82 01/08/2020 1350    Physical Exam:    VS:  BP 120/78 (BP Location: Left Arm, Patient Position: Sitting)   Pulse 86   Ht 5\' 3"  (1.6 m)   Wt 167 lb 9.6 oz (76 kg)   SpO2 97%   BMI 29.69 kg/m     Wt Readings from Last 3 Encounters:  03/16/23 167 lb 9.6 oz (76 kg)  02/24/23 168 lb (76.2 kg)  01/07/23 166 lb (75.3 kg)     GEN: Anxious well nourished, well developed in no acute distress HEENT: Normal NECK: No JVD; No carotid bruits LYMPHATICS: No lymphadenopathy CARDIAC: RRR, no murmurs, rubs, gallops RESPIRATORY:  Clear to auscultation without rales, wheezing or rhonchi  ABDOMEN: Soft, non-tender, non-distended MUSCULOSKELETAL:  No edema; No deformity  SKIN: Warm and dry NEUROLOGIC:  Alert and oriented x 3 PSYCHIATRIC:  Normal affect    Signed, Norman Herrlich, MD  03/16/2023 9:24 AM    [facility]

## 2023-03-16 ENCOUNTER — Ambulatory Visit: Payer: Medicare PPO | Attending: Cardiology | Admitting: Cardiology

## 2023-03-16 ENCOUNTER — Encounter: Payer: Self-pay | Admitting: Cardiology

## 2023-03-16 VITALS — BP 120/78 | HR 86 | Ht 63.0 in | Wt 167.6 lb

## 2023-03-16 DIAGNOSIS — I10 Essential (primary) hypertension: Secondary | ICD-10-CM | POA: Diagnosis not present

## 2023-03-16 DIAGNOSIS — I201 Angina pectoris with documented spasm: Secondary | ICD-10-CM

## 2023-03-16 DIAGNOSIS — I4719 Other supraventricular tachycardia: Secondary | ICD-10-CM

## 2023-03-16 DIAGNOSIS — E782 Mixed hyperlipidemia: Secondary | ICD-10-CM

## 2023-03-16 DIAGNOSIS — R0602 Shortness of breath: Secondary | ICD-10-CM

## 2023-03-16 NOTE — Patient Instructions (Signed)
Medication Instructions:  Your physician recommends that you continue on your current medications as directed. Please refer to the Current Medication list given to you today.  *If you need a refill on your cardiac medications before your next appointment, please call your pharmacy*   Lab Work: None If you have labs (blood work) drawn today and your tests are completely normal, you will receive your results only by: MyChart Message (if you have MyChart) OR A paper copy in the mail If you have any lab test that is abnormal or we need to change your treatment, we will call you to review the results.   Testing/Procedures: None   Follow-Up: At [facility], you and your health needs are our priority.  As part of our continuing mission to provide you with exceptional heart care, we have created designated Provider Care Teams.  These Care Teams include your primary Cardiologist (physician) and Advanced Practice Providers (APPs -  Physician Assistants and Nurse Practitioners) who all work together to provide you with the care you need, when you need it.  We recommend signing up for the patient portal called "MyChart".  Sign up information is provided on this After Visit Summary.  MyChart is used to connect with patients for Virtual Visits (Telemedicine).  Patients are able to view lab/test results, encounter notes, upcoming appointments, etc.  Non-urgent messages can be sent to your provider as well.   To learn more about what you can do with MyChart, go to [email or web address].    Your next appointment:   6 month(s)  Provider:   Norman Herrlich, MD    Other Instructions Check blood pressure daily and send a list of blood pressures to Dr. Dulce Sellar in 2 weeks.

## 2023-03-17 DIAGNOSIS — R350 Frequency of micturition: Secondary | ICD-10-CM | POA: Diagnosis not present

## 2023-03-26 DIAGNOSIS — R58 Hemorrhage, not elsewhere classified: Secondary | ICD-10-CM | POA: Diagnosis not present

## 2023-03-26 DIAGNOSIS — W228XXA Striking against or struck by other objects, initial encounter: Secondary | ICD-10-CM | POA: Diagnosis not present

## 2023-03-26 DIAGNOSIS — S81812A Laceration without foreign body, left lower leg, initial encounter: Secondary | ICD-10-CM | POA: Diagnosis not present

## 2023-03-26 DIAGNOSIS — Z7982 Long term (current) use of aspirin: Secondary | ICD-10-CM | POA: Diagnosis not present

## 2023-03-26 DIAGNOSIS — Z23 Encounter for immunization: Secondary | ICD-10-CM | POA: Diagnosis not present

## 2023-03-26 DIAGNOSIS — I10 Essential (primary) hypertension: Secondary | ICD-10-CM | POA: Diagnosis not present

## 2023-03-26 DIAGNOSIS — Z79899 Other long term (current) drug therapy: Secondary | ICD-10-CM | POA: Diagnosis not present

## 2023-03-31 ENCOUNTER — Encounter: Payer: Self-pay | Admitting: Physician Assistant

## 2023-04-01 ENCOUNTER — Telehealth: Payer: Self-pay

## 2023-04-01 NOTE — Telephone Encounter (Signed)
Transition Care Management Unsuccessful Follow-up Telephone Call  Date of discharge and from where:  [facility] 7/5  Attempts:  1st Attempt  Reason for unsuccessful TCM follow-up call:  No answer/busy   Lenard Forth [facility], [facility] [phone] [address] [place], [place], [place] [address] Phone: [phone] Email: [email or web address]

## 2023-04-02 ENCOUNTER — Telehealth: Payer: Self-pay

## 2023-04-02 NOTE — Telephone Encounter (Signed)
Transition Care Management Unsuccessful Follow-up Telephone Call  Date of discharge and from where:  [facility] 7/5  Attempts:  2nd Attempt  Reason for unsuccessful TCM follow-up call:  No answer/busy   Lenard Forth [facility] Guide, [facility] [phone] [address] [place], [place], [place] [address] Phone: [phone] Email: [email or web address]

## 2023-04-07 DIAGNOSIS — E785 Hyperlipidemia, unspecified: Secondary | ICD-10-CM | POA: Diagnosis not present

## 2023-04-07 DIAGNOSIS — K449 Diaphragmatic hernia without obstruction or gangrene: Secondary | ICD-10-CM | POA: Diagnosis not present

## 2023-04-07 DIAGNOSIS — E039 Hypothyroidism, unspecified: Secondary | ICD-10-CM | POA: Diagnosis not present

## 2023-04-07 DIAGNOSIS — I10 Essential (primary) hypertension: Secondary | ICD-10-CM | POA: Diagnosis not present

## 2023-04-21 DIAGNOSIS — M25562 Pain in left knee: Secondary | ICD-10-CM | POA: Diagnosis not present

## 2023-04-21 DIAGNOSIS — M1712 Unilateral primary osteoarthritis, left knee: Secondary | ICD-10-CM | POA: Diagnosis not present

## 2023-04-26 ENCOUNTER — Telehealth: Payer: Self-pay | Admitting: Cardiology

## 2023-04-26 NOTE — Telephone Encounter (Signed)
   Pre-operative Risk Assessment    Patient Name: Melissa Lozano  DOB: 10-25-1940 MRN: [id number]      Request for Surgical Clearance    Procedure:   Left total knee arthroplasty   Date of Surgery:  Clearance TBD                                 Surgeon:  Linda Hedges MD  Surgeon's Group or Practice Name:  [facility]  Phone number:  [phone] Fax number:  [phone]   Type of Clearance Requested:   - Medical    Type of Anesthesia:  General    Additional requests/questions:    SignedBelva Bertin   04/26/2023, 4:15 PM

## 2023-04-27 NOTE — Telephone Encounter (Signed)
Dr. Dulce Sellar,   You saw this patient on 03/16/2023. Will you please comment on medical clearance for left total knee arthroplasty?  Please route your response to P [facility] Preop. I will communicate with requesting office once you have given recommendations.   Thank you!  Carlos Levering, NP

## 2023-04-27 NOTE — Telephone Encounter (Signed)
   Name: Melissa Lozano  DOB: 08/05/1941  MRN: [id number]   Primary Cardiologist: Norman Herrlich, MD  Chart reviewed as part of pre-operative protocol coverage. Melissa Lozano was last seen on 03/16/2023 by Dr. Dulce Sellar.  At that time she was doing well. Per Dr. Dulce Sellar, "she did not have heart failure during her recent brief hospitalization in my opinion she is optimized for planned orthopedic surgery."   Therefore, based on ACC/AHA guidelines, the patient would be at acceptable risk for the planned procedure without further cardiovascular testing.   I will route this recommendation to the requesting party via Epic fax function and remove from pre-op pool. Please call with questions.  Carlos Levering, NP 04/27/2023, 2:27 PM

## 2023-04-28 DIAGNOSIS — M79609 Pain in unspecified limb: Secondary | ICD-10-CM | POA: Diagnosis not present

## 2023-04-28 DIAGNOSIS — E559 Vitamin D deficiency, unspecified: Secondary | ICD-10-CM | POA: Diagnosis not present

## 2023-04-28 DIAGNOSIS — Z0389 Encounter for observation for other suspected diseases and conditions ruled out: Secondary | ICD-10-CM | POA: Diagnosis not present

## 2023-04-28 DIAGNOSIS — Z01818 Encounter for other preprocedural examination: Secondary | ICD-10-CM | POA: Diagnosis not present

## 2023-04-28 DIAGNOSIS — Z79899 Other long term (current) drug therapy: Secondary | ICD-10-CM | POA: Diagnosis not present

## 2023-04-28 NOTE — Progress Notes (Signed)
I saw Melissa Lozano in neurology clinic on 05/06/23 in follow up for leg weakness.  HPI: Melissa Lozano is a 82 y.o. year old female with a history of hypothyroidism, HTN, HLD, paroxysmal atrial tachcardia, coronary artery spasm c/b MI, migraine, retinal detachment (OS ) who we last saw on 11/05/22.  To briefly review: Initial consultation 09/25/22: Patient is having weakness in her legs and hands. In her hands, she mentions distal weakness, difficulty opening things. She denies proximal weakness in her arms. She has been having weakness in her legs for about 1 year. She has difficulty getting out of a chair or getting off the ground. She has fallen twice in the last year. One of the falls was when she turned her ankle on the sidewalk. She had a sprain of the left ankle. It is still swollen. This was about 1 year ago. In 06/2022, she stubbed her toe on the sidewalk and fell. She fractured her right hand in the fall. She does not currently walk with an assistive device. She has a cane, but feels this trips her up more than anything. She denies muscle pain. She denies numbness or tingling. She denies muscle atrophy. She has gained about 10 pounds over the last year.   Previous labs found her to have an elevated TSH. She has been taking synthroid. She had lab work in 08/2022 that showed her thyroid was normal. I do not have any of these results.   She was on atorvastatin for HLD for a long time. She was told to stop. She is not sure if she did. She recently started Zetia (taken about 3 doses).    Patient lives in an independent living facility in her own apartment. She has an Engineer, structural. She is independent of ADLs.   EtOH use: Drink socially  Restrictive diet? No Family history of neuropathy/myopathy/NM disease? No  11/05/22: EMG on 10/12/22 showed no electrodiagnostic evidence of myopathy. There was evidence of the residuals of a right C8 radiculopathy, mild in degree. Labs showed a normal CK (62,  similar to 15 years ago), negative myositis panel (see full results below), HMGCR ab negative, aldolase normal.    Patient thinks she is doing a little better with physical therapy. She has not fallen. She has no new complaints today.   She denies muscle pains. She does have some pain in left hip, but only at night.   She is still not taking her statin.  Most recent Assessment and Plan (11/05/22): This is Melissa Lozano, a 82 y.o. female with distal upper extremity weakness and proximal lower extremity weakness. EMG showed no evidence of myopathy and labs, including CK and myopathy panel, were negative. 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 The cause of patient's lower extremity proximal weakness is currently unclear. She has some hyperreflexia in all extremities, so cervical stenosis is possible, but this would not well explain proximal weakness. She seems to be improving with PT, so we will continue that for now and monitor her symptoms. She will call with new or worsening symptoms.    Plan: -Continue PT -Discussed MRI cervical spine as a possible next step, but patient preferred to defer for now -Okay to continue Zetia. Recommended she hold off on statin for now.  Since their last visit: Patient is having left knee pain. It is going to be replace in Sept or Oct of 2024 depending on whether  a leg wound that she hit on her car door heals.  In terms of her weakness, she thinks things are about the same. She is walking 4000 steps a day with a walker, but thinks her knee is holding her back.  She had one fall since last visit, when her knee buckled.   MEDICATIONS:  Outpatient Encounter Medications as of 05/06/2023  Medication Sig Note   aspirin EC 81 MG tablet Take 81 mg by mouth daily.    calcium-vitamin D (OSCAL WITH D) 500-200 MG-UNIT TABS tablet Take 1 tablet by mouth 2 (two) times daily.    Cholecalciferol  (VITAMIN D3 PO) Take 1 capsule by mouth daily.    cloNIDine (CATAPRES) 0.1 MG tablet Take 1 tablet (0.1 mg total) by mouth as needed (for DBP greater than 100).    denosumab (PROLIA) 60 MG/ML SOSY injection Inject 60 mg into the skin every 6 (six) months.    ezetimibe (ZETIA) 10 MG tablet Take 10 mg by mouth daily.    famotidine (PEPCID) 40 MG tablet Take 40 mg by mouth daily. 02/24/2023: Takes in the evening.   levothyroxine (SYNTHROID) 50 MCG tablet Take 50 mcg by mouth daily.    MYRBETRIQ 50 MG TB24 tablet Take 50 mg by mouth daily.    nitroGLYCERIN (NITROSTAT) 0.4 MG SL tablet Place 1 tablet (0.4 mg total) under the tongue every 5 (five) minutes as needed for chest pain.    polyethylene glycol powder (GLYCOLAX/MIRALAX) powder Take 0.5 Containers by mouth daily.    psyllium (METAMUCIL) 58.6 % packet Take 1 packet by mouth daily.    spironolactone (ALDACTONE) 25 MG tablet Take 25 mg by mouth in the morning.    verapamil (CALAN-SR) 180 MG CR tablet Take 1 tablet (180 mg total) by mouth in the morning, at noon, and at bedtime. (Patient taking differently: Take 180 mg by mouth 2 (two) times daily.)    Vitamins-Lipotropics (LIPOFLAVOVIT PO) Take 1 tablet by mouth 2 (two) times daily.    acetaminophen (TYLENOL 8 HOUR) 650 MG CR tablet Take 650 mg by mouth every 6 (six) hours as needed for pain. (Patient not taking: Reported on 05/06/2023)    diclofenac Sodium (VOLTAREN) 1 % GEL Apply 2 g topically as needed (pain). (Patient not taking: Reported on 05/06/2023)    losartan (COZAAR) 50 MG tablet Take 50 mg by mouth at bedtime. (Patient not taking: Reported on 05/06/2023)    meloxicam (MOBIC) 15 MG tablet Take 15 mg by mouth daily. (Patient not taking: Reported on 05/06/2023)    Multiple Vitamins-Minerals (MULTIPLE VITAMINS/WOMENS PO) Take 1 tablet by mouth daily. (Patient not taking: Reported on 05/06/2023)    Omega-3 Fatty Acids (FISH OIL) 1000 MG CAPS Take 1 capsule by mouth daily. (Patient not taking:  Reported on 05/06/2023)    oxybutynin (DITROPAN-XL) 10 MG 24 hr tablet Take 10 mg by mouth daily. (Patient not taking: Reported on 05/06/2023)    pantoprazole (PROTONIX) 40 MG tablet Take 40 mg by mouth daily. (Patient not taking: Reported on 05/06/2023) 02/24/2023: Takes in the morning   No facility-administered encounter medications on file as of 05/06/2023.    PAST MEDICAL HISTORY: Past Medical History:  Diagnosis Date   Angina pectoris (HCC) 08/16/2015   Overview:  stable, no recurrence of her present meds and continue same with a CCB, aspirin and a statin. Stress echo negative for ischemia at 10 mets 08/23/13   C. difficile colitis 06/10/2017   Cataract    Mixed OU  Coronary artery spasm (HCC) 08/16/2015   Essential hypertension 08/16/2015   Hyperlipidemia 08/16/2015   Hypertension    Hypertensive retinopathy    OU   Migraine 06/10/2017   Old MI (myocardial infarction) 06/10/2017   STEMI September 2008, normal coronary arteriography, 0 CTCa Score     Osteoporosis    PAT (paroxysmal atrial tachycardia) 08/16/2015   Retinal detachment    OS    PAST SURGICAL HISTORY: Past Surgical History:  Procedure Laterality Date   ABDOMINAL HYSTERECTOMY     BUNIONECTOMY     reverse bunionectomy   CARDIAC CATHETERIZATION     CHOLECYSTECTOMY     EYE SURGERY Left 12/24/2020   Pneumatic retinopexy for retinoschisis RD repair - Dr. 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 Rennis Chris    ALLERGIES: Allergies  Allergen Reactions   Sulfa Antibiotics Nausea And Vomiting and Nausea Only    Other reaction(s): Vomiting  Other Reaction(s): GI Intolerance   Codeine Itching   Levaquin [Levofloxacin] Other (See Comments)    Does not remember reaction.   Prednisone Other (See Comments)    Flush/ redness   Tape Itching   Wound Dressing Adhesive Itching    FAMILY HISTORY: Family History   Problem Relation Age of Onset   Aneurysm Father    Melanoma Brother    Diabetes Maternal Grandmother     SOCIAL HISTORY: Social History   Tobacco Use   Smoking status: Never    Passive exposure: Past   Smokeless tobacco: Never  Vaping Use   Vaping status: Never Used  Substance Use Topics   Alcohol use: Yes    Alcohol/week: 0.0 standard drinks of alcohol    Comment: occasional   Drug use: No   Social History   Social History Narrative   Are you right handed or left handed? Right   Are you currently employed ?    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 Retirement home   Who lives with you?    What type of home do you live in: 1 story or 2 story? one        Objective:  Vital Signs:  BP (!) 154/83   Pulse 74   Ht 5\' 3"  (1.6 m)   Wt 168 lb (76.2 kg)   SpO2 99%   BMI 29.76 kg/m   General: General appearance: Awake and alert. No distress. Cooperative with exam.  Skin: No obvious rash or jaundice. HEENT: Atraumatic. Anicteric. Lungs: Non-labored breathing on room air   Neurological: Mental Status: Alert. Speech fluent. No pseudobulbar affect Cranial Nerves: CNII: No RAPD. Visual fields intact. CNIII, IV, VI: PERRL. No nystagmus. EOMI. CN V: Facial sensation intact bilaterally to fine touch. CN VII: Facial muscles symmetric and strong. No ptosis at rest. CN VIII: Hears finger rub well bilaterally. CN IX: No hypophonia. CN X: Palate elevates symmetrically. CN XI: Full strength shoulder shrug bilaterally. CN XII: Tongue protrusion full and midline. No atrophy or fasciculations. No significant dysarthria Motor: Tone is normal. No atrophy.  Individual muscle group testing (MRC grade out of 5):  Movement     Neck flexion 5    Neck extension 5     Right Left   Shoulder abduction 5 5   Shoulder adduction 5 5   Shoulder ext rotation 5 5   Shoulder int rotation 5 5  Elbow flexion 5 5   Elbow extension 5 5   Finger abduction -  FDI 5 5   Finger abduction - ADM 5 5   Finger extension 5 5   Finger distal flexion - 2/3 5 5    Finger distal flexion - 4/5 5 5    Thumb flexion - FPL 5 5   Thumb abduction - APB 5 5    Hip flexion 5- 4+ to 5- Limited by pain on left  Hip extension 5 5   Hip adduction 5 5   Hip abduction 5 5   Knee extension 5 5   Knee flexion 5 5   Dorsiflexion 5 5   Plantarflexion 5 5     Reflexes:  Right Left  Bicep 3+ 3+  Tricep 3+ 3+  BrRad 3+ 3+  Knee 3+ 3+  Ankle 2+ 2+   Sensation: Intact to light touch in all extremities Coordination: Intact finger-to- nose-finger bilaterally. Gait: Waddling gait. Normal base.   Lab and Test Review: No new results  CTA chest w/ contrast (01/25/23):   Echocardiogram (01/25/23):   Previously reviewed results: 10/09/22: Myositis panel Component     Latest Ref Rng 10/12/2022  JO-1 AB     <11 SI <11   PL-7 AB     <11 SI <11   PL-12 AB     <11 SI <11   EJ AB     <11 SI <11   OJ AB     <11 SI <11   SRP-AB     <11 SI <11   MI-2 ALPHA AB     <11 SI <11   MI-2 BETA AB     <11 SI <11   MDA-5 AB     <11 SI <11   TIF-1y AB     <11 SI <11   NXP-2 AB     <11 SI <11     Component     Latest Ref Rng 10/09/2022  JO-1 AB     <11 SI <11   PL-7 AB     <11 SI <11   PL-12 AB     <11 SI <11   EJ AB     <11 SI <11   OJ AB     <11 SI <11   SRP-AB     <11 SI <11   MI-2 ALPHA AB     <11 SI <11   MI-2 BETA AB     <11 SI <11   MDA-5 AB     <11 SI <11   TIF-1y AB     <11 SI <11   NXP-2 AB     <11 SI <11   HMGCR AB (IGG)     <20 CU <2   CYTOSOLIC 5' NUCLEOTIDASE 1A (cN 1A) AB (IGG)     Units 6    CK: 62 Aldolase: 4.2  Per Dr. Hulen Shouts note from 09/08/22: HbA1c: 5.6% TSH: 5.19   EMG (10/12/22): NCV & EMG Findings: Extensive electrodiagnostic evaluation of the right upper and lower limbs show: Right sural and right median sensory responses are within normal limits. Right peroneal/fibular (EDB) and right median (ABP) motor  responses are within normal limits. Chronic motor axon loss changes without accompanying active denervation changes are seen in the right first dorsal interosseous, right extensor indicis proprius, and right flexor digitorum longus muscles.   Impression: This is an abnormal electrodiagnostic evaluation. The findings are most consistent with the following: No electrodiagnostic evidence of myopathy. The  residuals of an old intraspinal canal lesion (ie: motor radiculopathy) at the right C8 root, mild in degree electrically. No electrodiagnostic evidence of a right lumbosacral (L2-S1) motor radiculopathy. No electrodiagnostic evidence of a right median mononeuropathy at or distal to the wrist, ie carpal tunnel syndrome.    ASSESSMENT: This is KALIOPI GABAY, a 82 y.o. female with distal upper extremity weakness and proximal lower extremity weakness. EMG showed no evidence of myopathy and labs, including CK and myopathy panel, were negative. 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 The cause of patient's lower extremity proximal weakness is currently unclear. She has some hyperreflexia in all extremities, so cervical stenosis is possible, but this would not well explain proximal weakness. She improved with PT, but lately has had more left knee pain which may be holding back further progress. She will be getting left knee replacement in 06/2023. We agreed to continue to monitor symptoms and re-evaluate after her knee surgery and recovery. She will call with new or worsening symptoms.   Plan: -Continue home exercises -Again discussed MRI cervical spine, but patient would like to wait until after knee surgery  Return to clinic in 6 months  Total time spent reviewing records, interview, history/exam, documentation, and coordination of care on day of encounter:  20 min  Jacquelyne Balint, MD

## 2023-05-06 ENCOUNTER — Encounter: Payer: Self-pay | Admitting: Neurology

## 2023-05-06 ENCOUNTER — Ambulatory Visit: Payer: Medicare PPO | Admitting: Neurology

## 2023-05-06 VITALS — BP 154/83 | HR 74 | Ht 63.0 in | Wt 168.0 lb

## 2023-05-06 DIAGNOSIS — G8929 Other chronic pain: Secondary | ICD-10-CM | POA: Diagnosis not present

## 2023-05-06 DIAGNOSIS — M159 Polyosteoarthritis, unspecified: Secondary | ICD-10-CM

## 2023-05-06 DIAGNOSIS — W19XXXS Unspecified fall, sequela: Secondary | ICD-10-CM

## 2023-05-06 DIAGNOSIS — M25562 Pain in left knee: Secondary | ICD-10-CM

## 2023-05-06 DIAGNOSIS — M5412 Radiculopathy, cervical region: Secondary | ICD-10-CM | POA: Diagnosis not present

## 2023-05-06 DIAGNOSIS — R29898 Other symptoms and signs involving the musculoskeletal system: Secondary | ICD-10-CM | POA: Diagnosis not present

## 2023-05-06 NOTE — Patient Instructions (Signed)
We will re-evaluate your symptoms in 6 months after your knee surgery. Please let me know if you have any questions or concerns in the meantime.   The physicians and staff at [facility] are committed to providing excellent care. You may receive a survey requesting feedback about your experience at our office. We strive to receive "very good" responses to the survey questions. If you feel that your experience would prevent you from giving the office a "very good " response, please contact our office to try to remedy the situation. We may be reached at [phone]. Thank you for taking the time out of your busy day to complete the survey.  Jacquelyne Balint, MD [facility]

## 2023-05-12 DIAGNOSIS — L820 Inflamed seborrheic keratosis: Secondary | ICD-10-CM | POA: Diagnosis not present

## 2023-05-19 ENCOUNTER — Telehealth: Payer: Self-pay | Admitting: Neurology

## 2023-05-19 NOTE — Telephone Encounter (Signed)
Called and let pt know about answer per Dr. Loleta Chance. She said she called earlier and cancelled the message because Dr. Loleta Chance does not prescribe it.

## 2023-05-19 NOTE — Telephone Encounter (Signed)
Patient called stating that she was looking through her charts and she noticed that she had been prescribed  Ezetimbe 10mg  , but has never taken the mediation or picked it up from the pharmacy. She is wondering should she take it or what to do

## 2023-06-24 DIAGNOSIS — R350 Frequency of micturition: Secondary | ICD-10-CM | POA: Diagnosis not present

## 2023-07-12 DIAGNOSIS — R3121 Asymptomatic microscopic hematuria: Secondary | ICD-10-CM | POA: Diagnosis not present

## 2023-07-12 DIAGNOSIS — R351 Nocturia: Secondary | ICD-10-CM | POA: Diagnosis not present

## 2023-07-12 DIAGNOSIS — N3946 Mixed incontinence: Secondary | ICD-10-CM | POA: Diagnosis not present

## 2023-07-14 DIAGNOSIS — H31002 Unspecified chorioretinal scars, left eye: Secondary | ICD-10-CM | POA: Diagnosis not present

## 2023-07-14 DIAGNOSIS — H52203 Unspecified astigmatism, bilateral: Secondary | ICD-10-CM | POA: Diagnosis not present

## 2023-07-14 DIAGNOSIS — H35373 Puckering of macula, bilateral: Secondary | ICD-10-CM | POA: Diagnosis not present

## 2023-07-14 DIAGNOSIS — H532 Diplopia: Secondary | ICD-10-CM | POA: Diagnosis not present

## 2023-08-02 DIAGNOSIS — M810 Age-related osteoporosis without current pathological fracture: Secondary | ICD-10-CM | POA: Diagnosis not present

## 2023-08-18 DIAGNOSIS — M810 Age-related osteoporosis without current pathological fracture: Secondary | ICD-10-CM | POA: Diagnosis not present

## 2023-09-01 DIAGNOSIS — H353131 Nonexudative age-related macular degeneration, bilateral, early dry stage: Secondary | ICD-10-CM | POA: Diagnosis not present

## 2023-09-01 DIAGNOSIS — H35373 Puckering of macula, bilateral: Secondary | ICD-10-CM | POA: Diagnosis not present

## 2023-09-01 DIAGNOSIS — G43909 Migraine, unspecified, not intractable, without status migrainosus: Secondary | ICD-10-CM | POA: Diagnosis not present

## 2023-09-01 DIAGNOSIS — H31002 Unspecified chorioretinal scars, left eye: Secondary | ICD-10-CM | POA: Diagnosis not present

## 2023-09-06 DIAGNOSIS — J208 Acute bronchitis due to other specified organisms: Secondary | ICD-10-CM | POA: Diagnosis not present

## 2023-09-06 DIAGNOSIS — Z20822 Contact with and (suspected) exposure to covid-19: Secondary | ICD-10-CM | POA: Diagnosis not present

## 2023-09-06 DIAGNOSIS — J019 Acute sinusitis, unspecified: Secondary | ICD-10-CM | POA: Diagnosis not present

## 2023-09-06 DIAGNOSIS — B9689 Other specified bacterial agents as the cause of diseases classified elsewhere: Secondary | ICD-10-CM | POA: Diagnosis not present

## 2023-09-08 DIAGNOSIS — M79609 Pain in unspecified limb: Secondary | ICD-10-CM | POA: Diagnosis not present

## 2023-09-08 DIAGNOSIS — R52 Pain, unspecified: Secondary | ICD-10-CM | POA: Diagnosis not present

## 2023-09-08 DIAGNOSIS — Z79899 Other long term (current) drug therapy: Secondary | ICD-10-CM | POA: Diagnosis not present

## 2023-09-24 ENCOUNTER — Other Ambulatory Visit: Payer: Self-pay

## 2023-09-24 MED ORDER — VERAPAMIL HCL ER 180 MG PO TBCR: 180.0000 mg | EXTENDED_RELEASE_TABLET | Freq: Three times a day (TID) | ORAL | 1 refills | Status: DC

## 2023-09-27 DIAGNOSIS — J208 Acute bronchitis due to other specified organisms: Secondary | ICD-10-CM | POA: Diagnosis not present

## 2023-09-27 DIAGNOSIS — J40 Bronchitis, not specified as acute or chronic: Secondary | ICD-10-CM | POA: Diagnosis not present

## 2023-09-27 DIAGNOSIS — B9689 Other specified bacterial agents as the cause of diseases classified elsewhere: Secondary | ICD-10-CM | POA: Diagnosis not present

## 2023-10-12 DIAGNOSIS — Z79899 Other long term (current) drug therapy: Secondary | ICD-10-CM | POA: Diagnosis not present

## 2023-10-12 DIAGNOSIS — M25562 Pain in left knee: Secondary | ICD-10-CM | POA: Diagnosis not present

## 2023-10-12 DIAGNOSIS — M79609 Pain in unspecified limb: Secondary | ICD-10-CM | POA: Diagnosis not present

## 2023-10-12 DIAGNOSIS — R52 Pain, unspecified: Secondary | ICD-10-CM | POA: Diagnosis not present

## 2023-10-14 DIAGNOSIS — M25562 Pain in left knee: Secondary | ICD-10-CM | POA: Diagnosis not present

## 2023-10-14 DIAGNOSIS — M810 Age-related osteoporosis without current pathological fracture: Secondary | ICD-10-CM | POA: Diagnosis not present

## 2023-10-14 DIAGNOSIS — Z96651 Presence of right artificial knee joint: Secondary | ICD-10-CM | POA: Diagnosis not present

## 2023-10-14 DIAGNOSIS — Z471 Aftercare following joint replacement surgery: Secondary | ICD-10-CM | POA: Diagnosis not present

## 2023-10-14 DIAGNOSIS — G8918 Other acute postprocedural pain: Secondary | ICD-10-CM | POA: Diagnosis not present

## 2023-10-14 DIAGNOSIS — M1712 Unilateral primary osteoarthritis, left knee: Secondary | ICD-10-CM | POA: Diagnosis not present

## 2023-10-14 DIAGNOSIS — E7800 Pure hypercholesterolemia, unspecified: Secondary | ICD-10-CM | POA: Diagnosis not present

## 2023-10-14 DIAGNOSIS — E039 Hypothyroidism, unspecified: Secondary | ICD-10-CM | POA: Diagnosis not present

## 2023-10-14 DIAGNOSIS — G43909 Migraine, unspecified, not intractable, without status migrainosus: Secondary | ICD-10-CM | POA: Diagnosis not present

## 2023-10-14 DIAGNOSIS — I252 Old myocardial infarction: Secondary | ICD-10-CM | POA: Diagnosis not present

## 2023-10-14 DIAGNOSIS — K219 Gastro-esophageal reflux disease without esophagitis: Secondary | ICD-10-CM | POA: Diagnosis not present

## 2023-10-14 DIAGNOSIS — I10 Essential (primary) hypertension: Secondary | ICD-10-CM | POA: Diagnosis not present

## 2023-10-15 DIAGNOSIS — M1712 Unilateral primary osteoarthritis, left knee: Secondary | ICD-10-CM | POA: Diagnosis not present

## 2023-10-15 DIAGNOSIS — E7800 Pure hypercholesterolemia, unspecified: Secondary | ICD-10-CM | POA: Diagnosis not present

## 2023-10-15 DIAGNOSIS — E039 Hypothyroidism, unspecified: Secondary | ICD-10-CM | POA: Diagnosis not present

## 2023-10-15 DIAGNOSIS — M25562 Pain in left knee: Secondary | ICD-10-CM | POA: Diagnosis not present

## 2023-10-15 DIAGNOSIS — G43909 Migraine, unspecified, not intractable, without status migrainosus: Secondary | ICD-10-CM | POA: Diagnosis not present

## 2023-10-15 DIAGNOSIS — I10 Essential (primary) hypertension: Secondary | ICD-10-CM | POA: Diagnosis not present

## 2023-10-15 DIAGNOSIS — K219 Gastro-esophageal reflux disease without esophagitis: Secondary | ICD-10-CM | POA: Diagnosis not present

## 2023-10-15 DIAGNOSIS — M810 Age-related osteoporosis without current pathological fracture: Secondary | ICD-10-CM | POA: Diagnosis not present

## 2023-10-15 DIAGNOSIS — I252 Old myocardial infarction: Secondary | ICD-10-CM | POA: Diagnosis not present

## 2023-10-16 DIAGNOSIS — E7800 Pure hypercholesterolemia, unspecified: Secondary | ICD-10-CM | POA: Diagnosis not present

## 2023-10-16 DIAGNOSIS — I252 Old myocardial infarction: Secondary | ICD-10-CM | POA: Diagnosis not present

## 2023-10-16 DIAGNOSIS — G43909 Migraine, unspecified, not intractable, without status migrainosus: Secondary | ICD-10-CM | POA: Diagnosis not present

## 2023-10-16 DIAGNOSIS — M25562 Pain in left knee: Secondary | ICD-10-CM | POA: Diagnosis not present

## 2023-10-16 DIAGNOSIS — M810 Age-related osteoporosis without current pathological fracture: Secondary | ICD-10-CM | POA: Diagnosis not present

## 2023-10-16 DIAGNOSIS — K219 Gastro-esophageal reflux disease without esophagitis: Secondary | ICD-10-CM | POA: Diagnosis not present

## 2023-10-16 DIAGNOSIS — E039 Hypothyroidism, unspecified: Secondary | ICD-10-CM | POA: Diagnosis not present

## 2023-10-16 DIAGNOSIS — M1712 Unilateral primary osteoarthritis, left knee: Secondary | ICD-10-CM | POA: Diagnosis not present

## 2023-10-16 DIAGNOSIS — I10 Essential (primary) hypertension: Secondary | ICD-10-CM | POA: Diagnosis not present

## 2023-10-17 DIAGNOSIS — E039 Hypothyroidism, unspecified: Secondary | ICD-10-CM | POA: Diagnosis not present

## 2023-10-17 DIAGNOSIS — I10 Essential (primary) hypertension: Secondary | ICD-10-CM | POA: Diagnosis not present

## 2023-10-17 DIAGNOSIS — R059 Cough, unspecified: Secondary | ICD-10-CM | POA: Diagnosis not present

## 2023-10-17 DIAGNOSIS — R262 Difficulty in walking, not elsewhere classified: Secondary | ICD-10-CM | POA: Diagnosis not present

## 2023-10-17 DIAGNOSIS — G8918 Other acute postprocedural pain: Secondary | ICD-10-CM | POA: Diagnosis not present

## 2023-10-17 DIAGNOSIS — R531 Weakness: Secondary | ICD-10-CM | POA: Diagnosis not present

## 2023-10-17 DIAGNOSIS — K219 Gastro-esophageal reflux disease without esophagitis: Secondary | ICD-10-CM | POA: Diagnosis not present

## 2023-10-17 DIAGNOSIS — M810 Age-related osteoporosis without current pathological fracture: Secondary | ICD-10-CM | POA: Diagnosis not present

## 2023-10-17 DIAGNOSIS — M1712 Unilateral primary osteoarthritis, left knee: Secondary | ICD-10-CM | POA: Diagnosis not present

## 2023-10-17 DIAGNOSIS — E7800 Pure hypercholesterolemia, unspecified: Secondary | ICD-10-CM | POA: Diagnosis not present

## 2023-10-17 DIAGNOSIS — Z7401 Bed confinement status: Secondary | ICD-10-CM | POA: Diagnosis not present

## 2023-10-17 DIAGNOSIS — R2689 Other abnormalities of gait and mobility: Secondary | ICD-10-CM | POA: Diagnosis not present

## 2023-10-17 DIAGNOSIS — Z96652 Presence of left artificial knee joint: Secondary | ICD-10-CM | POA: Diagnosis not present

## 2023-10-17 DIAGNOSIS — G43909 Migraine, unspecified, not intractable, without status migrainosus: Secondary | ICD-10-CM | POA: Diagnosis not present

## 2023-10-17 DIAGNOSIS — M25562 Pain in left knee: Secondary | ICD-10-CM | POA: Diagnosis not present

## 2023-10-17 DIAGNOSIS — I252 Old myocardial infarction: Secondary | ICD-10-CM | POA: Diagnosis not present

## 2023-10-18 DIAGNOSIS — Z96652 Presence of left artificial knee joint: Secondary | ICD-10-CM | POA: Diagnosis not present

## 2023-10-18 DIAGNOSIS — G8918 Other acute postprocedural pain: Secondary | ICD-10-CM | POA: Diagnosis not present

## 2023-10-18 DIAGNOSIS — R262 Difficulty in walking, not elsewhere classified: Secondary | ICD-10-CM | POA: Diagnosis not present

## 2023-10-18 DIAGNOSIS — M810 Age-related osteoporosis without current pathological fracture: Secondary | ICD-10-CM | POA: Diagnosis not present

## 2023-10-18 DIAGNOSIS — Z4733 Aftercare following explantation of knee joint prosthesis: Secondary | ICD-10-CM | POA: Insufficient documentation

## 2023-10-25 NOTE — Progress Notes (Deleted)
 I saw Melissa Lozano in neurology clinic on 11/04/23 in follow up for leg weakness.  HPI: Melissa Lozano is a 83 y.o. year old female with a history of hypothyroidism, HTN, HLD, paroxysmal atrial tachcardia, coronary artery spasm c/b MI, migraine, retinal detachment (OS ) who we last saw on 05/06/23.  To briefly review: Initial consultation 09/25/22: Patient is having weakness in her legs and hands. In her hands, she mentions distal weakness, difficulty opening things. She denies proximal weakness in her arms. She has been having weakness in her legs for about 1 year. She has difficulty getting out of a chair or getting off the ground. She has fallen twice in the last year. One of the falls was when she turned her ankle on the sidewalk. She had a sprain of the left ankle. It is still swollen. This was about 1 year ago. In 06/2022, she stubbed her toe on the sidewalk and fell. She fractured her right hand in the fall. She does not currently walk with an assistive device. She has a cane, but feels this trips her up more than anything. She denies muscle pain. She denies numbness or tingling. She denies muscle atrophy. She has gained about 10 pounds over the last year.   Previous labs found her to have an elevated TSH. She has been taking synthroid. She had lab work in 08/2022 that showed her thyroid was normal. I do not have any of these results.   She was on atorvastatin for HLD for a long time. She was told to stop. She is not sure if she did. She recently started Zetia (taken about 3 doses).    Patient lives in an independent living facility in her own apartment. She has an Engineer, structural. She is independent of ADLs.   EtOH use: Drink socially  Restrictive diet? No Family history of neuropathy/myopathy/NM disease? No   11/05/22: EMG on 10/12/22 showed no electrodiagnostic evidence of myopathy. There was evidence of the residuals of a right C8 radiculopathy, mild in degree. Labs showed a normal CK (62,  similar to 15 years ago), negative myositis panel (see full results below), HMGCR ab negative, aldolase normal.    Patient thinks she is doing a little better with physical therapy. She has not fallen. She has no new complaints today.   She denies muscle pains. She does have some pain in left hip, but only at night.   She is still not taking her statin.  05/06/23: Patient is having left knee pain. It is going to be replace in Sept or Oct of 2024 depending on whether a leg wound that she hit on her car door heals.   In terms of her weakness, she thinks things are about the same. She is walking 4000 steps a day with a walker, but thinks her knee is holding her back.   She had one fall since last visit, when her knee buckled.  Most recent Assessment and Plan (05/06/23): This is SHAKAYLA Lozano, a 83 y.o. female with distal upper extremity weakness and proximal lower extremity weakness. EMG showed no evidence of myopathy and labs, including CK and myopathy panel, were negative. 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 The cause of patient's lower extremity proximal weakness is currently unclear. She has some hyperreflexia in all extremities, so cervical stenosis is possible, but this would not well explain proximal weakness. She improved with PT, but lately  has had more left knee pain which may be holding back further progress. She will be getting left knee replacement in 06/2023. We agreed to continue to monitor symptoms and re-evaluate after her knee surgery and recovery. She will call with new or worsening symptoms.    Plan: -Continue home exercises -Again discussed MRI cervical spine, but patient would like to wait until after knee surgery  Since their last visit: ***  Weakness? Falls? Knee?  ROS: Pertinent positive and negative systems reviewed in HPI. ***   MEDICATIONS:  Outpatient Encounter Medications as  of 11/04/2023  Medication Sig Note   acetaminophen (TYLENOL 8 HOUR) 650 MG CR tablet Take 650 mg by mouth every 6 (six) hours as needed for pain. (Patient not taking: Reported on 05/06/2023)    aspirin EC 81 MG tablet Take 81 mg by mouth daily.    calcium-vitamin D (OSCAL WITH D) 500-200 MG-UNIT TABS tablet Take 1 tablet by mouth 2 (two) times daily.    Cholecalciferol (VITAMIN D3 PO) Take 1 capsule by mouth daily.    cloNIDine (CATAPRES) 0.1 MG tablet Take 1 tablet (0.1 mg total) by mouth as needed (for DBP greater than 100).    denosumab (PROLIA) 60 MG/ML SOSY injection Inject 60 mg into the skin every 6 (six) months.    diclofenac Sodium (VOLTAREN) 1 % GEL Apply 2 g topically as needed (pain). (Patient not taking: Reported on 05/06/2023)    ezetimibe (ZETIA) 10 MG tablet Take 10 mg by mouth daily.    famotidine (PEPCID) 40 MG tablet Take 40 mg by mouth daily. 02/24/2023: Takes in the evening.   levothyroxine (SYNTHROID) 50 MCG tablet Take 50 mcg by mouth daily.    losartan (COZAAR) 50 MG tablet Take 50 mg by mouth at bedtime. (Patient not taking: Reported on 05/06/2023)    meloxicam (MOBIC) 15 MG tablet Take 15 mg by mouth daily. (Patient not taking: Reported on 05/06/2023)    Multiple Vitamins-Minerals (MULTIPLE VITAMINS/WOMENS PO) Take 1 tablet by mouth daily. (Patient not taking: Reported on 05/06/2023)    MYRBETRIQ 50 MG TB24 tablet Take 50 mg by mouth daily.    nitroGLYCERIN (NITROSTAT) 0.4 MG SL tablet Place 1 tablet (0.4 mg total) under the tongue every 5 (five) minutes as needed for chest pain.    Omega-3 Fatty Acids (FISH OIL) 1000 MG CAPS Take 1 capsule by mouth daily. (Patient not taking: Reported on 05/06/2023)    oxybutynin (DITROPAN-XL) 10 MG 24 hr tablet Take 10 mg by mouth daily. (Patient not taking: Reported on 05/06/2023)    pantoprazole (PROTONIX) 40 MG tablet Take 40 mg by mouth daily. (Patient not taking: Reported on 05/06/2023) 02/24/2023: Takes in the morning   polyethylene glycol  powder (GLYCOLAX/MIRALAX) powder Take 0.5 Containers by mouth daily.    psyllium (METAMUCIL) 58.6 % packet Take 1 packet by mouth daily.    spironolactone (ALDACTONE) 25 MG tablet Take 25 mg by mouth in the morning.    verapamil (CALAN-SR) 180 MG CR tablet Take 1 tablet (180 mg total) by mouth in the morning, at noon, and at bedtime.    Vitamins-Lipotropics (LIPOFLAVOVIT PO) Take 1 tablet by mouth 2 (two) times daily.    No facility-administered encounter medications on file as of 11/04/2023.    PAST MEDICAL HISTORY: Past Medical History:  Diagnosis Date   Angina pectoris (HCC) 08/16/2015   Overview:  stable, no recurrence of her present meds and continue same with a CCB, aspirin and a statin. Stress echo negative  for ischemia at 10 mets 08/23/13   C. difficile colitis 06/10/2017   Cataract    Mixed OU   Coronary artery spasm (HCC) 08/16/2015   Essential hypertension 08/16/2015   Hyperlipidemia 08/16/2015   Hypertension    Hypertensive retinopathy    OU   Migraine 06/10/2017   Old MI (myocardial infarction) 06/10/2017   STEMI September 2008, normal coronary arteriography, 0 CTCa Score     Osteoporosis    PAT (paroxysmal atrial tachycardia) 08/16/2015   Retinal detachment    OS    PAST SURGICAL HISTORY: Past Surgical History:  Procedure Laterality Date   ABDOMINAL HYSTERECTOMY     BUNIONECTOMY     reverse bunionectomy   CARDIAC CATHETERIZATION     CHOLECYSTECTOMY     EYE SURGERY Left 12/24/2020   Pneumatic retinopexy for retinoschisis RD repair - Dr. 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 Rennis Chris    ALLERGIES: Allergies  Allergen Reactions   Sulfa Antibiotics Nausea And Vomiting and Nausea Only    Other reaction(s): Vomiting  Other Reaction(s): GI Intolerance   Codeine Itching   Levaquin [Levofloxacin] Other (See Comments)    Does not remember reaction.    Prednisone Other (See Comments)    Flush/ redness   Tape Itching   Wound Dressing Adhesive Itching    FAMILY HISTORY: Family History  Problem Relation Age of Onset   Aneurysm Father    Melanoma Brother    Diabetes Maternal Grandmother     SOCIAL HISTORY: Social History   Tobacco Use   Smoking status: Never    Passive exposure: Past   Smokeless tobacco: Never  Vaping Use   Vaping status: Never Used  Substance Use Topics   Alcohol use: Yes    Alcohol/week: 0.0 standard drinks of alcohol    Comment: occasional   Drug use: No   Social History   Social History Narrative   Are you right handed or left handed? Right   Are you currently employed ?    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 Retirement home   Who lives with you?    What type of home do you live in: 1 story or 2 story? one        Objective:  Vital Signs:  There were no vitals taken for this visit.  General:*** General appearance: Awake and alert. No distress. Cooperative with exam.  Skin: No obvious rash or jaundice. HEENT: Atraumatic. Anicteric. Lungs: Non-labored breathing on room air  Heart: Regular Abdomen: Soft, non tender. Extremities: No edema. No obvious deformity.  Musculoskeletal: No obvious joint swelling.  Neurological: Mental Status: Alert. Speech fluent. No pseudobulbar affect Cranial Nerves: CNII: No RAPD. Visual fields intact. CNIII, IV, VI: PERRL. No nystagmus. EOMI. CN V: Facial sensation intact bilaterally to fine touch. [place] clench strong. Jaw jerk***. CN VII: Facial muscles symmetric and strong. No ptosis at rest or after sustained upgaze***. CN VIII: Hears finger rub well bilaterally. CN IX: No hypophonia. CN X: Palate elevates symmetrically. CN XI: Full strength shoulder shrug bilaterally. CN XII: Tongue protrusion full and midline. No atrophy or fasciculations. No significant dysarthria*** Motor: Tone is ***. *** fasciculations in ***  extremities. *** atrophy. No grip or percussive myotonia.  Individual muscle group testing (MRC grade out of 5):  Movement     Neck flexion ***  Neck extension ***     Right Left   Shoulder abduction *** ***   Shoulder adduction *** ***   Shoulder ext rotation *** ***   Shoulder int rotation *** ***   Elbow flexion *** ***   Elbow extension *** ***   Wrist extension *** ***   Wrist flexion *** ***   Finger abduction - FDI *** ***   Finger abduction - ADM *** ***   Finger extension *** ***   Finger distal flexion - 2/3 *** ***   Finger distal flexion - 4/5 *** ***   Thumb flexion - FPL *** ***   Thumb abduction - APB *** ***    Hip flexion *** ***   Hip extension *** ***   Hip adduction *** ***   Hip abduction *** ***   Knee extension *** ***   Knee flexion *** ***   Dorsiflexion *** ***   Plantarflexion *** ***   Inversion *** ***   Eversion *** ***   Great toe extension *** ***   Great toe flexion *** ***     Reflexes:  Right Left  Bicep *** ***  Tricep *** ***  BrRad *** ***  Knee *** ***  Ankle *** ***   Pathological Reflexes: Babinski: *** response bilaterally*** Hoffman: *** Troemner: *** Pectoral: *** Palmomental: *** Facial: *** Midline tap: *** Sensation: Pinprick: *** Vibration: *** Temperature: *** Proprioception: *** Coordination: Intact finger-to- nose-finger and heel-to-shin bilaterally. Romberg negative.*** Gait: Able to rise from chair with arms crossed unassisted. Normal, narrow-based gait. Able to tandem walk. Able to walk on toes and heels.***   Lab and Test Review: No new results***  Previously reviewed results: 10/09/22: Myositis panel Component     Latest Ref Rng 10/12/2022  JO-1 AB     <11 SI <11   PL-7 AB     <11 SI <11   PL-12 AB     <11 SI <11   EJ AB     <11 SI <11   OJ AB     <11 SI <11   SRP-AB     <11 SI <11   MI-2 ALPHA AB     <11 SI <11   MI-2 BETA AB     <11 SI <11   MDA-5 AB     <11 SI <11    TIF-1y AB     <11 SI <11   NXP-2 AB     <11 SI <11     Component     Latest Ref Rng 10/09/2022  JO-1 AB     <11 SI <11   PL-7 AB     <11 SI <11   PL-12 AB     <11 SI <11   EJ AB     <11 SI <11   OJ AB     <11 SI <11   SRP-AB     <11 SI <11   MI-2 ALPHA AB     <11 SI <11   MI-2 BETA AB     <11 SI <11   MDA-5 AB     <11 SI <11   TIF-1y AB     <11 SI <11   NXP-2 AB     <11 SI <11   HMGCR AB (IGG)     <20 CU <2   CYTOSOLIC 5' NUCLEOTIDASE 1A (cN 1A) AB (IGG)     Units 6    CK: 62 Aldolase: 4.2   Per Dr. Hulen Shouts note from  09/08/22: HbA1c: 5.6% TSH: 5.19   EMG (10/12/22): NCV & EMG Findings: Extensive electrodiagnostic evaluation of the right upper and lower limbs show: Right sural and right median sensory responses are within normal limits. Right peroneal/fibular (EDB) and right median (ABP) motor responses are within normal limits. Chronic motor axon loss changes without accompanying active denervation changes are seen in the right first dorsal interosseous, right extensor indicis proprius, and right flexor digitorum longus muscles.   Impression: This is an abnormal electrodiagnostic evaluation. The findings are most consistent with the following: No electrodiagnostic evidence of myopathy. The residuals of an old intraspinal canal lesion (ie: motor radiculopathy) at the right C8 root, mild in degree electrically. No electrodiagnostic evidence of a right lumbosacral (L2-S1) motor radiculopathy. No electrodiagnostic evidence of a right median mononeuropathy at or distal to the wrist, ie carpal tunnel syndrome.  CTA chest w/ contrast (01/25/23):    Echocardiogram (01/25/23):     ASSESSMENT: This is [name], a 83 y.o. female with:  ***  Plan: ***  Return to clinic in ***  Total time spent reviewing records, interview, history/exam, documentation, and coordination of care on day of encounter:  *** min  Jacquelyne Balint, MD

## 2023-10-27 DIAGNOSIS — R2689 Other abnormalities of gait and mobility: Secondary | ICD-10-CM | POA: Diagnosis not present

## 2023-11-01 DIAGNOSIS — I10 Essential (primary) hypertension: Secondary | ICD-10-CM | POA: Diagnosis not present

## 2023-11-01 DIAGNOSIS — Z471 Aftercare following joint replacement surgery: Secondary | ICD-10-CM | POA: Diagnosis not present

## 2023-11-01 DIAGNOSIS — E039 Hypothyroidism, unspecified: Secondary | ICD-10-CM | POA: Diagnosis not present

## 2023-11-01 DIAGNOSIS — K219 Gastro-esophageal reflux disease without esophagitis: Secondary | ICD-10-CM | POA: Diagnosis not present

## 2023-11-01 DIAGNOSIS — G43119 Migraine with aura, intractable, without status migrainosus: Secondary | ICD-10-CM | POA: Diagnosis not present

## 2023-11-01 DIAGNOSIS — M810 Age-related osteoporosis without current pathological fracture: Secondary | ICD-10-CM | POA: Diagnosis not present

## 2023-11-01 DIAGNOSIS — I7300 Raynaud's syndrome without gangrene: Secondary | ICD-10-CM | POA: Diagnosis not present

## 2023-11-01 DIAGNOSIS — I4719 Other supraventricular tachycardia: Secondary | ICD-10-CM | POA: Diagnosis not present

## 2023-11-01 DIAGNOSIS — D649 Anemia, unspecified: Secondary | ICD-10-CM | POA: Diagnosis not present

## 2023-11-03 DIAGNOSIS — D649 Anemia, unspecified: Secondary | ICD-10-CM | POA: Diagnosis not present

## 2023-11-03 DIAGNOSIS — E039 Hypothyroidism, unspecified: Secondary | ICD-10-CM | POA: Diagnosis not present

## 2023-11-03 DIAGNOSIS — K219 Gastro-esophageal reflux disease without esophagitis: Secondary | ICD-10-CM | POA: Diagnosis not present

## 2023-11-03 DIAGNOSIS — I10 Essential (primary) hypertension: Secondary | ICD-10-CM | POA: Diagnosis not present

## 2023-11-03 DIAGNOSIS — G43119 Migraine with aura, intractable, without status migrainosus: Secondary | ICD-10-CM | POA: Diagnosis not present

## 2023-11-03 DIAGNOSIS — I7300 Raynaud's syndrome without gangrene: Secondary | ICD-10-CM | POA: Diagnosis not present

## 2023-11-03 DIAGNOSIS — M810 Age-related osteoporosis without current pathological fracture: Secondary | ICD-10-CM | POA: Diagnosis not present

## 2023-11-03 DIAGNOSIS — Z471 Aftercare following joint replacement surgery: Secondary | ICD-10-CM | POA: Diagnosis not present

## 2023-11-03 DIAGNOSIS — I4719 Other supraventricular tachycardia: Secondary | ICD-10-CM | POA: Diagnosis not present

## 2023-11-04 ENCOUNTER — Ambulatory Visit: Payer: Medicare PPO | Admitting: Neurology

## 2023-11-04 DIAGNOSIS — E785 Hyperlipidemia, unspecified: Secondary | ICD-10-CM | POA: Diagnosis not present

## 2023-11-04 DIAGNOSIS — E039 Hypothyroidism, unspecified: Secondary | ICD-10-CM | POA: Diagnosis not present

## 2023-11-04 DIAGNOSIS — M1712 Unilateral primary osteoarthritis, left knee: Secondary | ICD-10-CM | POA: Diagnosis not present

## 2023-11-04 DIAGNOSIS — Z96652 Presence of left artificial knee joint: Secondary | ICD-10-CM | POA: Diagnosis not present

## 2023-11-08 DIAGNOSIS — E039 Hypothyroidism, unspecified: Secondary | ICD-10-CM | POA: Diagnosis not present

## 2023-11-08 DIAGNOSIS — I7300 Raynaud's syndrome without gangrene: Secondary | ICD-10-CM | POA: Diagnosis not present

## 2023-11-08 DIAGNOSIS — M810 Age-related osteoporosis without current pathological fracture: Secondary | ICD-10-CM | POA: Diagnosis not present

## 2023-11-08 DIAGNOSIS — I4719 Other supraventricular tachycardia: Secondary | ICD-10-CM | POA: Diagnosis not present

## 2023-11-08 DIAGNOSIS — G43119 Migraine with aura, intractable, without status migrainosus: Secondary | ICD-10-CM | POA: Diagnosis not present

## 2023-11-08 DIAGNOSIS — Z471 Aftercare following joint replacement surgery: Secondary | ICD-10-CM | POA: Diagnosis not present

## 2023-11-08 DIAGNOSIS — D649 Anemia, unspecified: Secondary | ICD-10-CM | POA: Diagnosis not present

## 2023-11-08 DIAGNOSIS — I10 Essential (primary) hypertension: Secondary | ICD-10-CM | POA: Diagnosis not present

## 2023-11-08 DIAGNOSIS — K219 Gastro-esophageal reflux disease without esophagitis: Secondary | ICD-10-CM | POA: Diagnosis not present

## 2023-11-11 DIAGNOSIS — G43119 Migraine with aura, intractable, without status migrainosus: Secondary | ICD-10-CM | POA: Diagnosis not present

## 2023-11-11 DIAGNOSIS — D649 Anemia, unspecified: Secondary | ICD-10-CM | POA: Diagnosis not present

## 2023-11-11 DIAGNOSIS — E039 Hypothyroidism, unspecified: Secondary | ICD-10-CM | POA: Diagnosis not present

## 2023-11-11 DIAGNOSIS — K219 Gastro-esophageal reflux disease without esophagitis: Secondary | ICD-10-CM | POA: Diagnosis not present

## 2023-11-11 DIAGNOSIS — M810 Age-related osteoporosis without current pathological fracture: Secondary | ICD-10-CM | POA: Diagnosis not present

## 2023-11-11 DIAGNOSIS — Z471 Aftercare following joint replacement surgery: Secondary | ICD-10-CM | POA: Diagnosis not present

## 2023-11-11 DIAGNOSIS — I10 Essential (primary) hypertension: Secondary | ICD-10-CM | POA: Diagnosis not present

## 2023-11-11 DIAGNOSIS — I7300 Raynaud's syndrome without gangrene: Secondary | ICD-10-CM | POA: Diagnosis not present

## 2023-11-11 DIAGNOSIS — I4719 Other supraventricular tachycardia: Secondary | ICD-10-CM | POA: Diagnosis not present

## 2023-11-17 DIAGNOSIS — H6123 Impacted cerumen, bilateral: Secondary | ICD-10-CM | POA: Diagnosis not present

## 2023-11-22 DIAGNOSIS — R2689 Other abnormalities of gait and mobility: Secondary | ICD-10-CM | POA: Diagnosis not present

## 2023-11-22 DIAGNOSIS — M25562 Pain in left knee: Secondary | ICD-10-CM | POA: Diagnosis not present

## 2023-11-22 DIAGNOSIS — M25462 Effusion, left knee: Secondary | ICD-10-CM | POA: Diagnosis not present

## 2023-11-23 DIAGNOSIS — M25562 Pain in left knee: Secondary | ICD-10-CM | POA: Diagnosis not present

## 2023-11-23 DIAGNOSIS — M1712 Unilateral primary osteoarthritis, left knee: Secondary | ICD-10-CM | POA: Diagnosis not present

## 2023-11-25 DIAGNOSIS — M25462 Effusion, left knee: Secondary | ICD-10-CM | POA: Diagnosis not present

## 2023-11-25 DIAGNOSIS — R2689 Other abnormalities of gait and mobility: Secondary | ICD-10-CM | POA: Diagnosis not present

## 2023-11-25 DIAGNOSIS — M25562 Pain in left knee: Secondary | ICD-10-CM | POA: Diagnosis not present

## 2023-11-29 DIAGNOSIS — R2689 Other abnormalities of gait and mobility: Secondary | ICD-10-CM | POA: Diagnosis not present

## 2023-11-29 DIAGNOSIS — M25562 Pain in left knee: Secondary | ICD-10-CM | POA: Diagnosis not present

## 2023-11-29 DIAGNOSIS — M25462 Effusion, left knee: Secondary | ICD-10-CM | POA: Diagnosis not present

## 2023-11-30 DIAGNOSIS — M7501 Adhesive capsulitis of right shoulder: Secondary | ICD-10-CM | POA: Diagnosis not present

## 2023-12-02 DIAGNOSIS — R2689 Other abnormalities of gait and mobility: Secondary | ICD-10-CM | POA: Diagnosis not present

## 2023-12-02 DIAGNOSIS — M25462 Effusion, left knee: Secondary | ICD-10-CM | POA: Diagnosis not present

## 2023-12-02 DIAGNOSIS — M25562 Pain in left knee: Secondary | ICD-10-CM | POA: Diagnosis not present

## 2023-12-06 DIAGNOSIS — R2689 Other abnormalities of gait and mobility: Secondary | ICD-10-CM | POA: Diagnosis not present

## 2023-12-06 DIAGNOSIS — M25462 Effusion, left knee: Secondary | ICD-10-CM | POA: Diagnosis not present

## 2023-12-06 DIAGNOSIS — M25562 Pain in left knee: Secondary | ICD-10-CM | POA: Diagnosis not present

## 2023-12-08 DIAGNOSIS — L820 Inflamed seborrheic keratosis: Secondary | ICD-10-CM | POA: Diagnosis not present

## 2023-12-10 DIAGNOSIS — M25562 Pain in left knee: Secondary | ICD-10-CM | POA: Diagnosis not present

## 2023-12-10 DIAGNOSIS — M25462 Effusion, left knee: Secondary | ICD-10-CM | POA: Diagnosis not present

## 2023-12-10 DIAGNOSIS — R2689 Other abnormalities of gait and mobility: Secondary | ICD-10-CM | POA: Diagnosis not present

## 2023-12-13 DIAGNOSIS — M25462 Effusion, left knee: Secondary | ICD-10-CM | POA: Diagnosis not present

## 2023-12-13 DIAGNOSIS — M25562 Pain in left knee: Secondary | ICD-10-CM | POA: Diagnosis not present

## 2023-12-13 DIAGNOSIS — R2689 Other abnormalities of gait and mobility: Secondary | ICD-10-CM | POA: Diagnosis not present

## 2023-12-17 DIAGNOSIS — M25562 Pain in left knee: Secondary | ICD-10-CM | POA: Diagnosis not present

## 2023-12-17 DIAGNOSIS — M25462 Effusion, left knee: Secondary | ICD-10-CM | POA: Diagnosis not present

## 2023-12-17 DIAGNOSIS — R2689 Other abnormalities of gait and mobility: Secondary | ICD-10-CM | POA: Diagnosis not present

## 2023-12-21 DIAGNOSIS — M25462 Effusion, left knee: Secondary | ICD-10-CM | POA: Diagnosis not present

## 2023-12-21 DIAGNOSIS — M25562 Pain in left knee: Secondary | ICD-10-CM | POA: Diagnosis not present

## 2023-12-21 DIAGNOSIS — R2689 Other abnormalities of gait and mobility: Secondary | ICD-10-CM | POA: Diagnosis not present

## 2023-12-24 DIAGNOSIS — R2689 Other abnormalities of gait and mobility: Secondary | ICD-10-CM | POA: Diagnosis not present

## 2023-12-24 DIAGNOSIS — M25562 Pain in left knee: Secondary | ICD-10-CM | POA: Diagnosis not present

## 2023-12-24 DIAGNOSIS — M25462 Effusion, left knee: Secondary | ICD-10-CM | POA: Diagnosis not present

## 2023-12-28 DIAGNOSIS — M25562 Pain in left knee: Secondary | ICD-10-CM | POA: Diagnosis not present

## 2023-12-28 DIAGNOSIS — M25462 Effusion, left knee: Secondary | ICD-10-CM | POA: Diagnosis not present

## 2023-12-28 DIAGNOSIS — R2689 Other abnormalities of gait and mobility: Secondary | ICD-10-CM | POA: Diagnosis not present

## 2023-12-29 DIAGNOSIS — H5712 Ocular pain, left eye: Secondary | ICD-10-CM | POA: Diagnosis not present

## 2023-12-29 DIAGNOSIS — M1712 Unilateral primary osteoarthritis, left knee: Secondary | ICD-10-CM | POA: Diagnosis not present

## 2023-12-29 DIAGNOSIS — M25562 Pain in left knee: Secondary | ICD-10-CM | POA: Diagnosis not present

## 2024-01-04 DIAGNOSIS — M25462 Effusion, left knee: Secondary | ICD-10-CM | POA: Diagnosis not present

## 2024-01-04 DIAGNOSIS — Z96652 Presence of left artificial knee joint: Secondary | ICD-10-CM | POA: Diagnosis not present

## 2024-01-04 DIAGNOSIS — M25562 Pain in left knee: Secondary | ICD-10-CM | POA: Diagnosis not present

## 2024-02-16 DIAGNOSIS — C4441 Basal cell carcinoma of skin of scalp and neck: Secondary | ICD-10-CM | POA: Diagnosis not present

## 2024-02-16 DIAGNOSIS — Z129 Encounter for screening for malignant neoplasm, site unspecified: Secondary | ICD-10-CM | POA: Diagnosis not present

## 2024-02-16 DIAGNOSIS — Z808 Family history of malignant neoplasm of other organs or systems: Secondary | ICD-10-CM | POA: Diagnosis not present

## 2024-02-16 DIAGNOSIS — Z85828 Personal history of other malignant neoplasm of skin: Secondary | ICD-10-CM | POA: Diagnosis not present

## 2024-02-16 DIAGNOSIS — D1801 Hemangioma of skin and subcutaneous tissue: Secondary | ICD-10-CM | POA: Diagnosis not present

## 2024-02-16 DIAGNOSIS — D485 Neoplasm of uncertain behavior of skin: Secondary | ICD-10-CM | POA: Diagnosis not present

## 2024-02-21 DIAGNOSIS — M7551 Bursitis of right shoulder: Secondary | ICD-10-CM | POA: Diagnosis not present

## 2024-03-01 DIAGNOSIS — M25462 Effusion, left knee: Secondary | ICD-10-CM | POA: Diagnosis not present

## 2024-03-01 DIAGNOSIS — R2689 Other abnormalities of gait and mobility: Secondary | ICD-10-CM | POA: Diagnosis not present

## 2024-03-01 DIAGNOSIS — M25562 Pain in left knee: Secondary | ICD-10-CM | POA: Diagnosis not present

## 2024-04-03 DIAGNOSIS — M7501 Adhesive capsulitis of right shoulder: Secondary | ICD-10-CM | POA: Diagnosis not present

## 2024-04-03 DIAGNOSIS — M7551 Bursitis of right shoulder: Secondary | ICD-10-CM | POA: Diagnosis not present

## 2024-04-05 DIAGNOSIS — H6122 Impacted cerumen, left ear: Secondary | ICD-10-CM | POA: Diagnosis not present

## 2024-04-21 DIAGNOSIS — M6281 Muscle weakness (generalized): Secondary | ICD-10-CM | POA: Diagnosis not present

## 2024-04-21 DIAGNOSIS — R293 Abnormal posture: Secondary | ICD-10-CM | POA: Diagnosis not present

## 2024-04-21 DIAGNOSIS — M7501 Adhesive capsulitis of right shoulder: Secondary | ICD-10-CM | POA: Diagnosis not present

## 2024-04-21 DIAGNOSIS — M25611 Stiffness of right shoulder, not elsewhere classified: Secondary | ICD-10-CM | POA: Diagnosis not present

## 2024-04-21 DIAGNOSIS — M25511 Pain in right shoulder: Secondary | ICD-10-CM | POA: Diagnosis not present

## 2024-04-21 DIAGNOSIS — M7551 Bursitis of right shoulder: Secondary | ICD-10-CM | POA: Diagnosis not present

## 2024-04-26 DIAGNOSIS — M25611 Stiffness of right shoulder, not elsewhere classified: Secondary | ICD-10-CM | POA: Diagnosis not present

## 2024-04-26 DIAGNOSIS — M6281 Muscle weakness (generalized): Secondary | ICD-10-CM | POA: Diagnosis not present

## 2024-04-26 DIAGNOSIS — R293 Abnormal posture: Secondary | ICD-10-CM | POA: Diagnosis not present

## 2024-04-26 DIAGNOSIS — M25511 Pain in right shoulder: Secondary | ICD-10-CM | POA: Diagnosis not present

## 2024-04-26 DIAGNOSIS — M7501 Adhesive capsulitis of right shoulder: Secondary | ICD-10-CM | POA: Diagnosis not present

## 2024-04-26 DIAGNOSIS — M7551 Bursitis of right shoulder: Secondary | ICD-10-CM | POA: Diagnosis not present

## 2024-05-02 DIAGNOSIS — C4441 Basal cell carcinoma of skin of scalp and neck: Secondary | ICD-10-CM | POA: Diagnosis not present

## 2024-05-10 DIAGNOSIS — R293 Abnormal posture: Secondary | ICD-10-CM | POA: Diagnosis not present

## 2024-05-10 DIAGNOSIS — M7551 Bursitis of right shoulder: Secondary | ICD-10-CM | POA: Diagnosis not present

## 2024-05-10 DIAGNOSIS — M25511 Pain in right shoulder: Secondary | ICD-10-CM | POA: Diagnosis not present

## 2024-05-10 DIAGNOSIS — M7501 Adhesive capsulitis of right shoulder: Secondary | ICD-10-CM | POA: Diagnosis not present

## 2024-05-10 DIAGNOSIS — M6281 Muscle weakness (generalized): Secondary | ICD-10-CM | POA: Diagnosis not present

## 2024-05-10 DIAGNOSIS — M25611 Stiffness of right shoulder, not elsewhere classified: Secondary | ICD-10-CM | POA: Diagnosis not present

## 2024-05-15 DIAGNOSIS — M7501 Adhesive capsulitis of right shoulder: Secondary | ICD-10-CM | POA: Diagnosis not present

## 2024-05-15 DIAGNOSIS — M25511 Pain in right shoulder: Secondary | ICD-10-CM | POA: Diagnosis not present

## 2024-05-15 DIAGNOSIS — M7551 Bursitis of right shoulder: Secondary | ICD-10-CM | POA: Diagnosis not present

## 2024-05-23 ENCOUNTER — Other Ambulatory Visit: Payer: Self-pay

## 2024-05-23 DIAGNOSIS — M19011 Primary osteoarthritis, right shoulder: Secondary | ICD-10-CM | POA: Diagnosis not present

## 2024-05-23 MED ORDER — VERAPAMIL HCL ER 180 MG PO TBCR: 180.0000 mg | EXTENDED_RELEASE_TABLET | Freq: Three times a day (TID) | ORAL | 0 refills | Status: DC

## 2024-05-29 DIAGNOSIS — M19011 Primary osteoarthritis, right shoulder: Secondary | ICD-10-CM | POA: Diagnosis not present

## 2024-06-05 DIAGNOSIS — Z9181 History of falling: Secondary | ICD-10-CM | POA: Diagnosis not present

## 2024-06-05 DIAGNOSIS — Z0000 Encounter for general adult medical examination without abnormal findings: Secondary | ICD-10-CM | POA: Diagnosis not present

## 2024-06-06 DIAGNOSIS — H90A32 Mixed conductive and sensorineural hearing loss, unilateral, left ear with restricted hearing on the contralateral side: Secondary | ICD-10-CM | POA: Diagnosis not present

## 2024-07-14 DIAGNOSIS — H353131 Nonexudative age-related macular degeneration, bilateral, early dry stage: Secondary | ICD-10-CM | POA: Diagnosis not present

## 2024-07-14 DIAGNOSIS — H52203 Unspecified astigmatism, bilateral: Secondary | ICD-10-CM | POA: Diagnosis not present

## 2024-07-19 NOTE — Progress Notes (Unsigned)
 Cardiology Office Note:    Date:  07/20/2024   ID:  Melissa Lozano, DOB Dec 09, 1940, MRN [id number]  PCP:  Keren Vicenta BRAVO, MD  Cardiologist:  Redell Leiter, MD    Referring MD: Keren Vicenta BRAVO, MD    ASSESSMENT:    1. Coronary artery spasm   2. PAT (paroxysmal atrial tachycardia)   3. Essential hypertension   4. Mixed hyperlipidemia    PLAN:    In order of problems listed above:  From my perspective Melissa Lozano is really done very well She has had no recurrent anginal discomfort has not required nitroglycerin  is on good medical therapy including her aspirin calcium  channel blocker nitroglycerin  as needed and nonstatin Zetia as she had marked muscle weakness with a statin and is intolerant No recurrent atrial tachycardia she will continue the verapamil  Her blood pressure is very well-controlled and she has not required clonidine  as needed and will continue losartan and verapamil  I am unsure about lipid-lowering therapy I do not have access to her last lipid profile but I am going to ask her to continue Zetia.   Next appointment: I will plan to see her back in 1 year   Medication Adjustments/Labs and Tests Ordered: Current medicines are reviewed at length with the patient today.  Concerns regarding medicines are outlined above.  Orders Placed This Encounter  Procedures   Comp Met (CMET)   Lipid Profile   Apolipoprotein B   EKG 12-Lead   No orders of the defined types were placed in this encounter.    History of Present Illness:    Melissa Lozano is a 83 y.o. female with a hx of ACS with coronary artery spasm 2014 with normal coronary arteriography hypertension hyperlipidemia paroxysmal atrial tachycardia and hyper lipidemia with statin induced myopathy last seen 03/16/2023.  Lipid profile 04/07/2023 cholesterol 243 LDL 156 this was off lipid-lowering treatment She tells me she has had repeat labs at her PCP office I cannot access  Cardiac perspective she is doing well  she is not having palpitations syncope chest pain shortness of breath edema She is struggling after knee surgery She seems quite unsure of herself  Compliance with diet, lifestyle and medications: Yes  EKG Interpretation Date/Time:  Thursday July 20 2024 16:28:07 EDT Ventricular Rate:  85 PR Interval:  138 QRS Duration:  80 QT Interval:  368 QTC Calculation: 437 R Axis:   -9  Text Interpretation: Normal sinus rhythm Normal ECG When compared with ECG of 25-May-2007 04:43, Unchanged Confirmed by Leiter Redell ([id number]) on 07/20/2024 4:32:43 PM   Past Medical History:  Diagnosis Date   Angina pectoris 08/16/2015   Overview:  stable, no recurrence of her present meds and continue same with a CCB, aspirin and a statin. Stress echo negative for ischemia at 10 mets 08/23/13   C. difficile colitis 06/10/2017   Cataract    Mixed OU   Coronary artery spasm 08/16/2015   Encounter for insertion of prosthetic knee after prior removal of knee prosthesis 10/18/2023   Essential hypertension 08/16/2015   Hyperlipidemia 08/16/2015   Hypertension    Hypertensive retinopathy    OU   Migraine 06/10/2017   Old MI (myocardial infarction) 06/10/2017   STEMI September 2008, normal coronary arteriography, 0 CTCa Score     Osteoporosis    Pain in left foot 04/22/2022   PAT (paroxysmal atrial tachycardia) 08/16/2015   Retinal detachment    OS   Swelling of ankle joint 04/22/2022    Current Medications: Current  Meds  Medication Sig   aspirin EC 81 MG tablet Take 81 mg by mouth daily.   calcium -vitamin D (OSCAL WITH D) 500-200 MG-UNIT TABS tablet Take 1 tablet by mouth 2 (two) times daily.   Cholecalciferol (VITAMIN D3 PO) Take 1 capsule by mouth daily.   denosumab  (PROLIA ) 60 MG/ML SOSY injection Inject 60 mg into the skin every 6 (six) months.   famotidine (PEPCID) 40 MG tablet Take 40 mg by mouth daily.   levothyroxine (SYNTHROID) 50 MCG tablet Take 50 mcg by mouth daily.   losartan (COZAAR)  50 MG tablet Take 50 mg by mouth daily.   pantoprazole (PROTONIX) 40 MG tablet Take 40 mg by mouth daily.   verapamil  (CALAN -SR) 180 MG CR tablet Take 1 tablet (180 mg total) by mouth in the morning, at noon, and at bedtime.   Vitamins-Lipotropics (LIPOFLAVOVIT PO) Take 1 tablet by mouth 2 (two) times daily.      EKGs/Labs/Other Studies Reviewed:    The following studies were reviewed today:      EKG Interpretation Date/Time:  Thursday July 20 2024 16:28:07 EDT Ventricular Rate:  85 PR Interval:  138 QRS Duration:  80 QT Interval:  368 QTC Calculation: 437 R Axis:   -9  Text Interpretation: Normal sinus rhythm Normal ECG When compared with ECG of 25-May-2007 04:43, Unchanged Confirmed by Monetta Rogue ([id number]) on 07/20/2024 4:32:43 PM     Physical Exam:    VS:  BP 130/74   Pulse 85   Ht 5' 3 (1.6 m)   Wt 153 lb 6.4 oz (69.6 kg)   SpO2 98%   BMI 27.17 kg/m     Wt Readings from Last 3 Encounters:  07/20/24 153 lb 6.4 oz (69.6 kg)  05/06/23 168 lb (76.2 kg)  03/16/23 167 lb 9.6 oz (76 kg)     GEN:  Well nourished, well developed in no acute distress HEENT: Normal NECK: No JVD; No carotid bruits LYMPHATICS: No lymphadenopathy CARDIAC: RRR, no murmurs, rubs, gallops RESPIRATORY:  Clear to auscultation without rales, wheezing or rhonchi  ABDOMEN: Soft, non-tender, non-distended MUSCULOSKELETAL:  No edema; No deformity  SKIN: Warm and dry NEUROLOGIC:  Alert and oriented x 3 PSYCHIATRIC:  Normal affect    Signed, Rogue Monetta, MD  07/20/2024 5:07 PM    [facility]

## 2024-07-20 ENCOUNTER — Ambulatory Visit: Attending: Cardiology | Admitting: Cardiology

## 2024-07-20 ENCOUNTER — Encounter: Payer: Self-pay | Admitting: Cardiology

## 2024-07-20 VITALS — BP 130/74 | HR 85 | Ht 63.0 in | Wt 153.4 lb

## 2024-07-20 DIAGNOSIS — E782 Mixed hyperlipidemia: Secondary | ICD-10-CM

## 2024-07-20 DIAGNOSIS — I4719 Other supraventricular tachycardia: Secondary | ICD-10-CM

## 2024-07-20 DIAGNOSIS — I201 Angina pectoris with documented spasm: Secondary | ICD-10-CM | POA: Diagnosis not present

## 2024-07-20 DIAGNOSIS — I10 Essential (primary) hypertension: Secondary | ICD-10-CM

## 2024-07-20 NOTE — Patient Instructions (Signed)
 Medication Instructions:  Your physician recommends that you continue on your current medications as directed. Please refer to the Current Medication list given to you today.  *If you need a refill on your cardiac medications before your next appointment, please call your pharmacy*  Lab Work: Your physician recommends that you return for lab work in:   Labs today: CMP, Lipids, Apo B  If you have labs (blood work) drawn today and your tests are completely normal, you will receive your results only by: MyChart Message (if you have MyChart) OR A paper copy in the mail If you have any lab test that is abnormal or we need to change your treatment, we will call you to review the results.  Testing/Procedures: None  Follow-Up: At [facility], you and your health needs are our priority.  As part of our continuing mission to provide you with exceptional heart care, our providers are all part of one team.  This team includes your primary Cardiologist (physician) and Advanced Practice Providers or APPs (Physician Assistants and Nurse Practitioners) who all work together to provide you with the care you need, when you need it.  Your next appointment:   1 year(s)  Provider:   Norman Herrlich, MD    We recommend signing up for the patient portal called "MyChart".  Sign up information is provided on this After Visit Summary.  MyChart is used to connect with patients for Virtual Visits (Telemedicine).  Patients are able to view lab/test results, encounter notes, upcoming appointments, etc.  Non-urgent messages can be sent to your provider as well.   To learn more about what you can do with MyChart, go to [email or web address].   Other Instructions None

## 2024-07-24 DIAGNOSIS — Z85828 Personal history of other malignant neoplasm of skin: Secondary | ICD-10-CM | POA: Diagnosis not present

## 2024-07-24 DIAGNOSIS — L820 Inflamed seborrheic keratosis: Secondary | ICD-10-CM | POA: Diagnosis not present

## 2024-07-24 DIAGNOSIS — Z08 Encounter for follow-up examination after completed treatment for malignant neoplasm: Secondary | ICD-10-CM | POA: Diagnosis not present

## 2024-07-24 DIAGNOSIS — D1801 Hemangioma of skin and subcutaneous tissue: Secondary | ICD-10-CM | POA: Diagnosis not present

## 2024-07-24 DIAGNOSIS — Z129 Encounter for screening for malignant neoplasm, site unspecified: Secondary | ICD-10-CM | POA: Diagnosis not present

## 2024-07-25 DIAGNOSIS — I10 Essential (primary) hypertension: Secondary | ICD-10-CM | POA: Diagnosis not present

## 2024-07-25 DIAGNOSIS — E782 Mixed hyperlipidemia: Secondary | ICD-10-CM | POA: Diagnosis not present

## 2024-07-25 DIAGNOSIS — I4719 Other supraventricular tachycardia: Secondary | ICD-10-CM | POA: Diagnosis not present

## 2024-07-25 DIAGNOSIS — I201 Angina pectoris with documented spasm: Secondary | ICD-10-CM | POA: Diagnosis not present

## 2024-07-26 ENCOUNTER — Ambulatory Visit: Payer: Self-pay | Admitting: Cardiology

## 2024-07-26 LAB — COMPREHENSIVE METABOLIC PANEL WITH GFR
ALT: 15 IU/L (ref 0–32)
AST: 21 IU/L (ref 0–40)
Albumin: 3.9 g/dL (ref 3.7–4.7)
Alkaline Phosphatase: 67 IU/L (ref 48–129)
BUN/Creatinine Ratio: 16 (ref 12–28)
BUN: 12 mg/dL (ref 8–27)
Bilirubin Total: 0.4 mg/dL (ref 0.0–1.2)
CO2: 25 mmol/L (ref 20–29)
Calcium: 9.1 mg/dL (ref 8.7–10.3)
Chloride: 106 mmol/L (ref 96–106)
Creatinine, Ser: 0.77 mg/dL (ref 0.57–1.00)
Globulin, Total: 2 g/dL (ref 1.5–4.5)
Glucose: 81 mg/dL (ref 70–99)
Potassium: 3.9 mmol/L (ref 3.5–5.2)
Sodium: 144 mmol/L (ref 134–144)
Total Protein: 5.9 g/dL — ABNORMAL LOW (ref 6.0–8.5)
eGFR: 76 mL/min/1.73 (ref >59)

## 2024-07-26 LAB — LIPID PANEL
Chol/HDL Ratio: 3.2 ratio (ref 0.0–4.4)
Cholesterol, Total: 254 mg/dL — ABNORMAL HIGH (ref 100–199)
HDL: 79 mg/dL (ref >39)
LDL Chol Calc (NIH): 159 mg/dL — ABNORMAL HIGH (ref 0–99)
Triglycerides: 92 mg/dL (ref 0–149)
VLDL Cholesterol Cal: 16 mg/dL (ref 5–40)

## 2024-07-26 LAB — APOLIPOPROTEIN B: Apolipoprotein B: 104 mg/dL — ABNORMAL HIGH (ref <90)

## 2024-07-27 ENCOUNTER — Other Ambulatory Visit: Payer: Self-pay

## 2024-07-27 DIAGNOSIS — R300 Dysuria: Secondary | ICD-10-CM | POA: Diagnosis not present

## 2024-07-27 DIAGNOSIS — E782 Mixed hyperlipidemia: Secondary | ICD-10-CM

## 2024-07-27 DIAGNOSIS — N390 Urinary tract infection, site not specified: Secondary | ICD-10-CM | POA: Diagnosis not present

## 2024-07-27 DIAGNOSIS — R350 Frequency of micturition: Secondary | ICD-10-CM | POA: Diagnosis not present

## 2024-07-27 DIAGNOSIS — R3915 Urgency of urination: Secondary | ICD-10-CM | POA: Diagnosis not present

## 2024-07-27 MED ORDER — NEXLETOL 180 MG PO TABS: 180.0000 mg | ORAL_TABLET | ORAL | 3 refills | Status: AC

## 2024-07-28 ENCOUNTER — Telehealth: Payer: Self-pay | Admitting: Cardiology

## 2024-07-28 ENCOUNTER — Telehealth: Payer: Self-pay | Admitting: Pharmacy Technician

## 2024-07-28 NOTE — Telephone Encounter (Signed)
 Patient came in office asking if she could be prescribed something besides the Nexletol, she says its costing her $500 a month. Please give her a call back at [phone]

## 2024-07-28 NOTE — Telephone Encounter (Signed)
 Called the patient and informed her that a prior authorization request had been sent to the PA team for her Nexletol and they would let us  know when it was completed. Patient verbalized understanding and had no further questions at this time.

## 2024-07-28 NOTE — Telephone Encounter (Signed)
 Pt c/o medication issue:  1. Name of Medication: Bempedoic Acid (NEXLETOL) 180 MG TABS   2. How are you currently taking this medication (dosage and times per day)? Not taking   3. Are you having a reaction (difficulty breathing--STAT)? No   4. What is your medication issue? Pt was told insurance would not cover medication without a prior authorization. Please advise.

## 2024-07-28 NOTE — Telephone Encounter (Signed)
 Pharmacy Patient Advocate Encounter  Received notification from [employer] that Prior Authorization for nexletol has been APPROVED from 07/28/24 to 09/20/25   PA #/Case ID/Reference #: [id number]

## 2024-07-28 NOTE — Telephone Encounter (Signed)
 Spoke to the patient and she stated that the medication was going to cost her $80. I explained that we had a prescription assistance team that I could send a message to so they possibly help to cover the out of pocket cost for the patient. Patient declined. Patient stated that she would pay for her cost of the medication and hopefully it helped to bring her cholesterol down. Patient had no further questions at this time.

## 2024-07-28 NOTE — Telephone Encounter (Signed)
   Pharmacy Patient Advocate Encounter   Received notification from CoverMyMeds that prior authorization for nexletol is required/requested.   Insurance verification completed.   The patient is insured through [employer].   Per test claim: PA required; PA submitted to above mentioned insurance via Latent Key/confirmation #/EOC [id number] Status is pending

## 2024-07-28 NOTE — Telephone Encounter (Signed)
 PA request has been Submitted. New Encounter has been or will be created for follow up. For additional info see Pharmacy Prior Auth telephone encounter from 07/28/24.

## 2024-08-23 ENCOUNTER — Other Ambulatory Visit: Payer: Self-pay

## 2024-08-24 MED ORDER — VERAPAMIL HCL ER 180 MG PO TBCR: 180.0000 mg | EXTENDED_RELEASE_TABLET | Freq: Three times a day (TID) | ORAL | 3 refills | Status: DC

## 2024-09-22 ENCOUNTER — Other Ambulatory Visit (HOSPITAL_BASED_OUTPATIENT_CLINIC_OR_DEPARTMENT_OTHER): Payer: Self-pay | Admitting: Internal Medicine

## 2024-09-22 DIAGNOSIS — N644 Mastodynia: Secondary | ICD-10-CM

## 2024-09-25 ENCOUNTER — Other Ambulatory Visit: Payer: Self-pay | Admitting: Cardiology

## 2024-09-26 MED ORDER — VERAPAMIL HCL ER 180 MG PO TBCR: 180.0000 mg | EXTENDED_RELEASE_TABLET | Freq: Three times a day (TID) | ORAL | 3 refills | Status: AC
